# Patient Record
Sex: Female | Born: 1970 | Race: White | Hispanic: No | Marital: Single | Smoking: Former smoker
Health system: Southern US, Community
[De-identification: ages and names within clinical notes are randomized; demographics above are authoritative.]

## PROBLEM LIST (undated history)

## (undated) DIAGNOSIS — F32A Depression, unspecified: Secondary | ICD-10-CM

## (undated) DIAGNOSIS — F419 Anxiety disorder, unspecified: Secondary | ICD-10-CM

## (undated) DIAGNOSIS — R011 Cardiac murmur, unspecified: Secondary | ICD-10-CM

## (undated) DIAGNOSIS — E0590 Thyrotoxicosis, unspecified without thyrotoxic crisis or storm: Secondary | ICD-10-CM

## (undated) DIAGNOSIS — E559 Vitamin D deficiency, unspecified: Secondary | ICD-10-CM

## (undated) DIAGNOSIS — E079 Disorder of thyroid, unspecified: Secondary | ICD-10-CM

## (undated) DIAGNOSIS — G43909 Migraine, unspecified, not intractable, without status migrainosus: Secondary | ICD-10-CM

## (undated) DIAGNOSIS — F329 Major depressive disorder, single episode, unspecified: Secondary | ICD-10-CM

## (undated) DIAGNOSIS — D649 Anemia, unspecified: Secondary | ICD-10-CM

## (undated) DIAGNOSIS — B019 Varicella without complication: Secondary | ICD-10-CM

## (undated) DIAGNOSIS — F988 Other specified behavioral and emotional disorders with onset usually occurring in childhood and adolescence: Secondary | ICD-10-CM

## (undated) DIAGNOSIS — R55 Syncope and collapse: Secondary | ICD-10-CM

## (undated) DIAGNOSIS — Z9989 Dependence on other enabling machines and devices: Secondary | ICD-10-CM

## (undated) DIAGNOSIS — N926 Irregular menstruation, unspecified: Secondary | ICD-10-CM

## (undated) DIAGNOSIS — R1310 Dysphagia, unspecified: Secondary | ICD-10-CM

## (undated) DIAGNOSIS — R7303 Prediabetes: Secondary | ICD-10-CM

## (undated) DIAGNOSIS — G4733 Obstructive sleep apnea (adult) (pediatric): Secondary | ICD-10-CM

## (undated) DIAGNOSIS — K219 Gastro-esophageal reflux disease without esophagitis: Secondary | ICD-10-CM

## (undated) DIAGNOSIS — M1990 Unspecified osteoarthritis, unspecified site: Secondary | ICD-10-CM

## (undated) HISTORY — DX: Morbid (severe) obesity due to excess calories: E66.01

## (undated) HISTORY — DX: Thyrotoxicosis, unspecified without thyrotoxic crisis or storm: E05.90

## (undated) HISTORY — DX: Dependence on other enabling machines and devices: Z99.89

## (undated) HISTORY — DX: Other specified behavioral and emotional disorders with onset usually occurring in childhood and adolescence: F98.8

## (undated) HISTORY — DX: Syncope and collapse: R55

## (undated) HISTORY — DX: Varicella without complication: B01.9

## (undated) HISTORY — DX: Obstructive sleep apnea (adult) (pediatric): G47.33

## (undated) HISTORY — DX: Major depressive disorder, single episode, unspecified: F32.9

## (undated) HISTORY — DX: Dysphagia, unspecified: R13.10

## (undated) HISTORY — DX: Migraine, unspecified, not intractable, without status migrainosus: G43.909

## (undated) HISTORY — DX: Vitamin D deficiency, unspecified: E55.9

## (undated) HISTORY — PX: TILT TABLE STUDY: SHX6124

## (undated) HISTORY — DX: Disorder of thyroid, unspecified: E07.9

## (undated) HISTORY — DX: Depression, unspecified: F32.A

## (undated) HISTORY — PX: WISDOM TOOTH EXTRACTION: SHX21

## (undated) HISTORY — DX: Unspecified osteoarthritis, unspecified site: M19.90

## (undated) HISTORY — DX: Gastro-esophageal reflux disease without esophagitis: K21.9

## (undated) HISTORY — DX: Anxiety disorder, unspecified: F41.9

## (undated) HISTORY — PX: BIOPSY EYE MUSCLE: PRO14

## (undated) HISTORY — DX: Irregular menstruation, unspecified: N92.6

---
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
## 2003-07-30 ENCOUNTER — Ambulatory Visit (HOSPITAL_COMMUNITY): Admission: RE | Admit: 2003-07-30 | Discharge: 2003-07-30 | Payer: Self-pay | Admitting: Cardiology

## 2004-09-26 ENCOUNTER — Other Ambulatory Visit: Admission: RE | Admit: 2004-09-26 | Discharge: 2004-09-26 | Payer: Self-pay | Admitting: Obstetrics and Gynecology

## 2005-03-07 ENCOUNTER — Encounter (HOSPITAL_COMMUNITY): Admission: RE | Admit: 2005-03-07 | Discharge: 2005-06-05 | Payer: Self-pay | Admitting: Internal Medicine

## 2007-03-09 ENCOUNTER — Emergency Department (HOSPITAL_COMMUNITY): Admission: EM | Admit: 2007-03-09 | Discharge: 2007-03-09 | Payer: Self-pay | Admitting: Emergency Medicine

## 2007-03-19 ENCOUNTER — Encounter: Admission: RE | Admit: 2007-03-19 | Discharge: 2007-03-19 | Payer: Self-pay | Admitting: Family Medicine

## 2007-05-20 ENCOUNTER — Ambulatory Visit (HOSPITAL_BASED_OUTPATIENT_CLINIC_OR_DEPARTMENT_OTHER): Admission: RE | Admit: 2007-05-20 | Discharge: 2007-05-20 | Payer: Self-pay | Admitting: Family Medicine

## 2007-05-26 ENCOUNTER — Ambulatory Visit: Payer: Self-pay | Admitting: Internal Medicine

## 2008-01-02 ENCOUNTER — Encounter: Admission: RE | Admit: 2008-01-02 | Discharge: 2008-01-02 | Payer: Self-pay | Admitting: Family Medicine

## 2008-01-14 ENCOUNTER — Encounter: Admission: RE | Admit: 2008-01-14 | Discharge: 2008-01-14 | Payer: Self-pay | Admitting: Family Medicine

## 2008-08-19 ENCOUNTER — Encounter: Admission: RE | Admit: 2008-08-19 | Discharge: 2008-08-19 | Payer: Self-pay | Admitting: Family Medicine

## 2009-05-09 ENCOUNTER — Emergency Department (HOSPITAL_COMMUNITY): Admission: EM | Admit: 2009-05-09 | Discharge: 2009-05-09 | Payer: Self-pay | Admitting: Emergency Medicine

## 2010-10-11 NOTE — Procedures (Signed)
NAME:  Yvette White, Yvette White NO.:  [id number]   MEDICAL RECORD NO.:  [id number]          PATIENT TYPE:  OUT   LOCATION:  SLEEP CENTER                 FACILITY:  [facility]   PHYSICIAN:  Clinton D. Maple Hudson, MD, FCCP, FACPDATE OF BIRTH:  Jan 07, 1973   DATE OF STUDY:  05/20/2007                            NOCTURNAL POLYSOMNOGRAM   REFERRING PHYSICIAN:  Talmadge Coventry, M.D.   INDICATION FOR STUDY:  Hypersomnia with sleep apnea.  BMI 45, weight 280  pounds, height 66 inches, neck 14 inches.   EPWORTH SLEEPINESS SCORE:  3/24   MEDICATIONS:  Home medications charted and reviewed.   SLEEP ARCHITECTURE:  Total sleep time 369 minutes with sleep efficiency  85%.  Stage I was 8%, stage II 57%, stage III 6%, REM 28% of total sleep  time.  Sleep latency 31 minutes, REM latency 103 minutes, awake after  sleep onset 21 minutes, arousal index 9.4.  Zolpidem was taken at 2230  p.m.   RESPIRATORY DATA:  Apnea/hypopnea index (AHI) 0.5 per hour, respiratory  disturbance index (RDI) 2.1 per hour (normal range 0-5 per hour).  There  were a total of three obstructive events, all hypopneas recorded while  sleeping supine.   OXYGEN DATA:  No snoring noted by technician.  Oxygen saturation nadir  91% with mean oxygen saturation through the study 95.6% on room air.   CARDIAC DATA:  Normal sinus rhythm.   MOVEMENT-PARASOMNIA:  No movement disturbance.  No bathroom trips.   IMPRESSIONS-RECOMMENDATIONS:  1. Unremarkable sleep architecture for sleep center environment.      Percentage of time spent in REM is a little higher than usually      seen for adults, sometimes reflecting rebound from recent previous      REM sleep deprivation or suppression by an antidepressant.  2. Insignificant respiratory sleep disturbance, apnea/hypopnea index      0.5 per hour with no snoring noted by the technician.      Clinton D. Maple Hudson, MD, [facility], FACP  Diplomate, Biomedical engineer of Sleep Medicine  Electronically Signed     CDY/MEDQ  D:  05/26/2007 12:04:29  T:  05/26/2007 23:47:05  Job:  [id number]

## 2010-10-14 NOTE — Cardiovascular Report (Signed)
Yvette White, Yvette White NO.:  [id number]   MEDICAL RECORD NO.:  [id number]                   PATIENT TYPE:  OIB   LOCATION:  2861                                 FACILITY:  [facility]   PHYSICIAN:  Armanda Magic, M.D.                  DATE OF BIRTH:  12-30-1970   DATE OF PROCEDURE:  07/30/2003  DATE OF DISCHARGE:  07/30/2003                              CARDIAC CATHETERIZATION   REFERRING PHYSICIAN:  Lyn Records, M.D.   PROCEDURE PERFORMED:  Tilt table test.   CARDIOLOGIST:  Armanda Magic, M.D.   INDICATIONS:  Syncope.   COMPLICATIONS:  None.   HISTORY:  This is a 40 year old white female who has had a syncopal episodes  and symptoms consistent with vasovagal syncope who now presents for a tilt  table test.   DESCRIPTION OF PROCEDURE:  The patient was bought to the cardiac  catheterization laboratory in the fasting nonsedated state.  Informed  consent was obtained. The patient was connected to continuous heart rate and  pulse oximetry monitoring, and intermittent blood pressure monitoring.  The  patient's blood pressure was measured for five minutes in the supine  position.  Baseline blood pressure measured 114/60 to 123/56 with heart  rates of 43-50.  The patient was then tilted upright to 70 degrees for a  total of 26 minutes.  At about 19 minutes into the tilt the patient started  feeling unsteady.  Blood pressure was 99/64 with a heart rate of 66.  She  then started to feel very nauseated and dizzy.  Blood pressure was around  101/68 with a heart rate of 68.  Suddenly she felt very syncopal and  actually passed out.  Heart rate went to 38 beats/minute with a 5.2 second  pause.  Blood pressure was 110/64.   Atropine 0.5 mg IV was given and the patient's blood pressure and heart rate  improved significantly after being placed in the supine position.   At the end of the procedure the patient was transferred back to her room in  stable  condition.   ASSESSMENT:  1. Syncope.  2. Positive tilt table test for vasovagal syncope, primarily bradycardia-     induced.   PLAN:  1. We will start Zoloft 25 mg a day.  2. The patient will follow up with me in two weeks.   Yvette White does get a definite prodrome prior to having her syncope and she has  really only passed out one time.  She states she has plenty of time if she  is driving to get off the road if she would feel it coming on.  She has  never passed out without a prodrome; status post, I told her it is okay for  her to drive, but mainly in town, would not recommend any significant  highway driving for now.  She is going to see me back  in two weeks.                                               Armanda Magic, M.D.    TT/MEDQ  D:  07/31/2003  T:  08/01/2003  Job:  [id number]   cc:   Lyn Records III, M.D.  [address]  [address]  [place]  [place] [address]  Fax: [phone]

## 2011-03-09 LAB — COMPREHENSIVE METABOLIC PANEL
ALT: 30
AST: 25
Albumin: 3.7
Alkaline Phosphatase: 100
BUN: 9
CO2: 26
Calcium: 8.5
Chloride: 102
Creatinine, Ser: 0.61
GFR calc Af Amer: >60
GFR calc non Af Amer: >60
Glucose, Bld: 90
Potassium: 3.5
Sodium: 136
Total Bilirubin: 0.5
Total Protein: 6.8

## 2011-03-09 LAB — CBC
HCT: 39
Hemoglobin: 13.4
MCHC: 34.2
MCV: 90.9
Platelets: 297
RBC: 4.3
RDW: 12.4
WBC: 9.8

## 2011-03-09 LAB — DIFFERENTIAL
Basophils Absolute: 0
Basophils Relative: 0
Eosinophils Absolute: 0.2
Eosinophils Relative: 2
Lymphocytes Relative: 17
Lymphs Abs: 1.7
Monocytes Absolute: 0.6
Monocytes Relative: 6
Neutro Abs: 7.2
Neutrophils Relative %: 74

## 2011-03-09 LAB — TSH: TSH: 0.244 — ABNORMAL LOW

## 2011-04-12 ENCOUNTER — Other Ambulatory Visit: Payer: Self-pay | Admitting: Family Medicine

## 2011-04-12 DIAGNOSIS — R58 Hemorrhage, not elsewhere classified: Secondary | ICD-10-CM

## 2011-04-14 ENCOUNTER — Ambulatory Visit
Admission: RE | Admit: 2011-04-14 | Discharge: 2011-04-14 | Disposition: A | Discharge status: Home / Self Care | Payer: BC Managed Care – PPO | Source: Ambulatory Visit | Attending: Family Medicine | Admitting: Family Medicine

## 2011-04-14 DIAGNOSIS — R58 Hemorrhage, not elsewhere classified: Secondary | ICD-10-CM

## 2012-08-21 ENCOUNTER — Encounter: Payer: Self-pay | Admitting: Diagnostic Neuroimaging

## 2012-08-22 ENCOUNTER — Institutional Professional Consult (permissible substitution): Payer: Self-pay | Admitting: Diagnostic Neuroimaging

## 2012-08-23 ENCOUNTER — Ambulatory Visit: Payer: Self-pay | Admitting: Diagnostic Neuroimaging

## 2012-09-16 NOTE — Telephone Encounter (Signed)
This encounter was created in error - please disregard.

## 2013-01-17 ENCOUNTER — Other Ambulatory Visit: Payer: Self-pay

## 2013-01-17 DIAGNOSIS — Z1231 Encounter for screening mammogram for malignant neoplasm of breast: Secondary | ICD-10-CM

## 2013-02-12 ENCOUNTER — Ambulatory Visit
Admission: RE | Admit: 2013-02-12 | Discharge: 2013-02-12 | Disposition: A | Discharge status: Home / Self Care | Payer: BC Managed Care – PPO | Source: Ambulatory Visit

## 2013-02-12 DIAGNOSIS — Z1231 Encounter for screening mammogram for malignant neoplasm of breast: Secondary | ICD-10-CM

## 2013-02-13 ENCOUNTER — Other Ambulatory Visit: Payer: Self-pay | Admitting: Family Medicine

## 2013-02-13 DIAGNOSIS — R928 Other abnormal and inconclusive findings on diagnostic imaging of breast: Secondary | ICD-10-CM

## 2013-02-28 ENCOUNTER — Other Ambulatory Visit: Payer: BC Managed Care – PPO

## 2013-03-03 ENCOUNTER — Ambulatory Visit
Admission: RE | Admit: 2013-03-03 | Discharge: 2013-03-03 | Disposition: A | Discharge status: Home / Self Care | Payer: BC Managed Care – PPO | Source: Ambulatory Visit | Attending: Family Medicine | Admitting: Family Medicine

## 2013-03-03 ENCOUNTER — Other Ambulatory Visit: Payer: Self-pay | Admitting: Family Medicine

## 2013-03-03 DIAGNOSIS — R928 Other abnormal and inconclusive findings on diagnostic imaging of breast: Secondary | ICD-10-CM

## 2013-03-05 ENCOUNTER — Other Ambulatory Visit: Payer: BC Managed Care – PPO

## 2013-08-25 DIAGNOSIS — J302 Other seasonal allergic rhinitis: Secondary | ICD-10-CM | POA: Insufficient documentation

## 2014-12-28 ENCOUNTER — Other Ambulatory Visit: Payer: Self-pay | Admitting: Family Medicine

## 2014-12-28 ENCOUNTER — Other Ambulatory Visit (HOSPITAL_COMMUNITY)
Admission: RE | Admit: 2014-12-28 | Discharge: 2014-12-28 | Disposition: A | Discharge status: Home / Self Care | Payer: 59 | Source: Ambulatory Visit | Attending: Family Medicine | Admitting: Family Medicine

## 2014-12-28 DIAGNOSIS — Z124 Encounter for screening for malignant neoplasm of cervix: Secondary | ICD-10-CM | POA: Diagnosis not present

## 2014-12-31 LAB — CYTOLOGY - PAP

## 2015-09-06 DIAGNOSIS — K219 Gastro-esophageal reflux disease without esophagitis: Secondary | ICD-10-CM | POA: Diagnosis not present

## 2015-10-05 DIAGNOSIS — H04123 Dry eye syndrome of bilateral lacrimal glands: Secondary | ICD-10-CM | POA: Diagnosis not present

## 2015-10-05 DIAGNOSIS — H10413 Chronic giant papillary conjunctivitis, bilateral: Secondary | ICD-10-CM | POA: Diagnosis not present

## 2015-12-27 DIAGNOSIS — F900 Attention-deficit hyperactivity disorder, predominantly inattentive type: Secondary | ICD-10-CM | POA: Diagnosis not present

## 2015-12-27 DIAGNOSIS — F340 Cyclothymic disorder: Secondary | ICD-10-CM | POA: Diagnosis not present

## 2016-03-06 DIAGNOSIS — Z6841 Body Mass Index (BMI) 40.0 and over, adult: Secondary | ICD-10-CM | POA: Insufficient documentation

## 2016-03-06 DIAGNOSIS — K219 Gastro-esophageal reflux disease without esophagitis: Secondary | ICD-10-CM | POA: Insufficient documentation

## 2016-03-06 DIAGNOSIS — N926 Irregular menstruation, unspecified: Secondary | ICD-10-CM | POA: Insufficient documentation

## 2016-03-06 DIAGNOSIS — R1314 Dysphagia, pharyngoesophageal phase: Secondary | ICD-10-CM | POA: Diagnosis not present

## 2016-03-06 DIAGNOSIS — R55 Syncope and collapse: Secondary | ICD-10-CM | POA: Insufficient documentation

## 2016-03-08 ENCOUNTER — Encounter: Payer: Self-pay | Admitting: Internal Medicine

## 2016-03-09 DIAGNOSIS — N926 Irregular menstruation, unspecified: Secondary | ICD-10-CM | POA: Diagnosis not present

## 2016-03-09 DIAGNOSIS — R1314 Dysphagia, pharyngoesophageal phase: Secondary | ICD-10-CM | POA: Diagnosis not present

## 2016-03-09 DIAGNOSIS — Z13220 Encounter for screening for lipoid disorders: Secondary | ICD-10-CM | POA: Diagnosis not present

## 2016-03-09 DIAGNOSIS — Z6841 Body Mass Index (BMI) 40.0 and over, adult: Secondary | ICD-10-CM | POA: Diagnosis not present

## 2016-03-09 DIAGNOSIS — E559 Vitamin D deficiency, unspecified: Secondary | ICD-10-CM | POA: Diagnosis not present

## 2016-03-09 DIAGNOSIS — K219 Gastro-esophageal reflux disease without esophagitis: Secondary | ICD-10-CM | POA: Diagnosis not present

## 2016-03-21 ENCOUNTER — Ambulatory Visit: Payer: Self-pay | Admitting: Internal Medicine

## 2016-03-22 DIAGNOSIS — N939 Abnormal uterine and vaginal bleeding, unspecified: Secondary | ICD-10-CM | POA: Diagnosis not present

## 2016-03-22 DIAGNOSIS — R102 Pelvic and perineal pain: Secondary | ICD-10-CM | POA: Diagnosis not present

## 2016-03-30 DIAGNOSIS — N925 Other specified irregular menstruation: Secondary | ICD-10-CM | POA: Diagnosis not present

## 2016-03-30 DIAGNOSIS — N939 Abnormal uterine and vaginal bleeding, unspecified: Secondary | ICD-10-CM | POA: Diagnosis not present

## 2016-05-23 ENCOUNTER — Ambulatory Visit: Payer: Self-pay | Admitting: Internal Medicine

## 2016-05-29 NOTE — H&P (Addendum)
  S:  Yvette White presents today for evaluation of abnormal bleeding.   O:    Physical exam:  General:  Alert and oriented.  Normocephalic and atraumatic.  No thyromegaly or masses palpated.  Heart is regular rate and rhythm without murmur.  Lungs are clear to auscultation bilaterally.  Breasts without masses, lymphadenopathy or discharge.  Abdomen is soft, nontender, nondistended.  No rebound or guarding.  No flank pain is noted.  Extremities without clubbing, cyanosis or edema.  Normal external genitalia, Bartholin, Skene's and urethra.  Vagina without lesions.  Cervix within normal limits.  Uterus is anteverted, mobile and nontender.  No adnexal masses are palpable.  No inguinal    Saline infusion ultrasound was carried out without difficulty and shows several intramural fibroids measuring 2.7 and 2.4 cm in size.  She does have a simple left ovarian cyst 3.5 cm in size.  However, the endometrial lining is slightly thickened.  There is a posterior wall area that is suspicious for a polyp that is 10 mm in size and one on the anterior surface as well.    A/P:  Abnormal uterine bleeding.  Endometrial masses consistent with polyps.  Recommend hysteroscopy D&C for evaluation and removal of these.  Discussed the risks and benefits and pros and cons at length.  She does give her informed consent and wishes to proceed.   06/08/16 This patient has been seen and examined.   All of her questions were answered.  Labs and vital signs reviewed.  Informed consent has been obtained.  The History and Physical is current. DL

## 2016-05-31 NOTE — Patient Instructions (Signed)
Your procedure is scheduled on:  Thursday, Jan. 11, 2018  Enter through the [employer] [facility] at:  6:00 AM  Pick up the phone at the desk and dial [phone].  Call this number if you have problems the morning of surgery: [phone].  Remember: Do NOT eat food or drink after:  Midnight Wednesday  Take these medicines the morning of surgery with a SIP OF WATER:  Omeprazole, Clonazepam if needed  Stop ALL herbal medications at this time   Do NOT wear jewelry (body piercing), metal hair clips/bobby pins, make-up, or nail polish. Do NOT wear lotions, powders, or perfumes.  You may wear deodorant. Do NOT shave for 48 hours prior to surgery. Do NOT bring valuables to the hospital. Contacts, dentures, or bridgework may not be worn into surgery.  Have a responsible adult drive you home and stay with you for 24 hours after your procedure

## 2016-06-01 ENCOUNTER — Encounter (HOSPITAL_COMMUNITY): Payer: Self-pay

## 2016-06-01 ENCOUNTER — Encounter (HOSPITAL_COMMUNITY)
Admission: RE | Admit: 2016-06-01 | Discharge: 2016-06-01 | Disposition: A | Discharge status: Home / Self Care | Payer: BLUE CROSS/BLUE SHIELD | Source: Ambulatory Visit | Attending: Obstetrics and Gynecology | Admitting: Obstetrics and Gynecology

## 2016-06-01 DIAGNOSIS — N859 Noninflammatory disorder of uterus, unspecified: Secondary | ICD-10-CM | POA: Diagnosis not present

## 2016-06-01 DIAGNOSIS — Z01812 Encounter for preprocedural laboratory examination: Secondary | ICD-10-CM | POA: Diagnosis not present

## 2016-06-01 DIAGNOSIS — N939 Abnormal uterine and vaginal bleeding, unspecified: Secondary | ICD-10-CM | POA: Insufficient documentation

## 2016-06-01 HISTORY — DX: Anemia, unspecified: D64.9

## 2016-06-01 HISTORY — DX: Prediabetes: R73.03

## 2016-06-01 HISTORY — DX: Cardiac murmur, unspecified: R01.1

## 2016-06-01 LAB — CBC
HCT: 35.3 % — ABNORMAL LOW (ref 36.0–46.0)
Hemoglobin: 11.7 g/dL — ABNORMAL LOW (ref 12.0–15.0)
MCH: 26.5 pg (ref 26.0–34.0)
MCHC: 33.1 g/dL (ref 30.0–36.0)
MCV: 80 fL (ref 78.0–100.0)
PLATELETS: 271 10*3/uL (ref 150–400)
RBC: 4.41 MIL/uL (ref 3.87–5.11)
RDW: 13.1 % (ref 11.5–15.5)
WBC: 9.6 10*3/uL (ref 4.0–10.5)

## 2016-06-07 MED ORDER — GENTAMICIN SULFATE 40 MG/ML IJ SOLN
INTRAVENOUS | Status: AC
  Administered 2016-06-08: 100 mL via INTRAVENOUS
  Filled 2016-06-07: qty 12.75

## 2016-06-07 NOTE — Anesthesia Preprocedure Evaluation (Addendum)
Anesthesia Evaluation  Patient identified by MRN, date of birth, ID band Patient awake    Reviewed: Allergy & Precautions, NPO status , Patient's Chart, lab work & pertinent test results  Airway Mallampati: I  TM Distance: >3 FB Neck ROM: Full    Dental  (+) Teeth Intact, Dental Advisory Given   Pulmonary neg pulmonary ROS, former smoker,    breath sounds clear to auscultation       Cardiovascular negative cardio ROS   Rhythm:Regular Rate:Normal     Neuro/Psych  Headaches, PSYCHIATRIC DISORDERS Anxiety Depression    GI/Hepatic Neg liver ROS, GERD  Medicated and Controlled,  Endo/Other  Hyperthyroidism Morbid obesity  Renal/GU negative Renal ROS  negative genitourinary   Musculoskeletal  (+) Arthritis , Osteoarthritis,    Abdominal   Peds negative pediatric ROS (+)  Hematology  (+) anemia ,   Anesthesia Other Findings Neurocardiogenic Syncope  Reproductive/Obstetrics negative OB ROS                            Lab Results  Component Value Date   WBC 9.6 06/01/2016   HGB 11.7 (L) 06/01/2016   HCT 35.3 (L) 06/01/2016   MCV 80.0 06/01/2016   PLT 271 06/01/2016   No results found for: INR, PROTIME   Anesthesia Physical Anesthesia Plan  ASA: III  Anesthesia Plan: General   Post-op Pain Management:    Induction: Intravenous  Airway Management Planned: LMA  Additional Equipment:   Intra-op Plan:   Post-operative Plan: Extubation in OR  Informed Consent: I have reviewed the patients History and Physical, chart, labs and discussed the procedure including the risks, benefits and alternatives for the proposed anesthesia with the patient or authorized representative who has indicated his/her understanding and acceptance.   Dental advisory given  Plan Discussed with: CRNA  Anesthesia Plan Comments:        Anesthesia Quick Evaluation

## 2016-06-08 ENCOUNTER — Encounter (HOSPITAL_COMMUNITY): Payer: Self-pay | Admitting: Emergency Medicine

## 2016-06-08 ENCOUNTER — Ambulatory Visit (HOSPITAL_COMMUNITY): Payer: BLUE CROSS/BLUE SHIELD | Admitting: Anesthesiology

## 2016-06-08 ENCOUNTER — Encounter (HOSPITAL_COMMUNITY)
Admission: RE | Disposition: A | Discharge status: Home / Self Care | Payer: Self-pay | Source: Ambulatory Visit | Attending: Obstetrics and Gynecology

## 2016-06-08 ENCOUNTER — Ambulatory Visit (HOSPITAL_COMMUNITY)
Admission: RE | Admit: 2016-06-08 | Discharge: 2016-06-08 | Disposition: A | Discharge status: Home / Self Care | Payer: BLUE CROSS/BLUE SHIELD | Source: Ambulatory Visit | Attending: Obstetrics and Gynecology | Admitting: Obstetrics and Gynecology

## 2016-06-08 DIAGNOSIS — K219 Gastro-esophageal reflux disease without esophagitis: Secondary | ICD-10-CM | POA: Insufficient documentation

## 2016-06-08 DIAGNOSIS — F329 Major depressive disorder, single episode, unspecified: Secondary | ICD-10-CM | POA: Insufficient documentation

## 2016-06-08 DIAGNOSIS — N939 Abnormal uterine and vaginal bleeding, unspecified: Secondary | ICD-10-CM | POA: Diagnosis not present

## 2016-06-08 DIAGNOSIS — F419 Anxiety disorder, unspecified: Secondary | ICD-10-CM | POA: Diagnosis not present

## 2016-06-08 DIAGNOSIS — E039 Hypothyroidism, unspecified: Secondary | ICD-10-CM | POA: Diagnosis not present

## 2016-06-08 DIAGNOSIS — R51 Headache: Secondary | ICD-10-CM | POA: Diagnosis not present

## 2016-06-08 DIAGNOSIS — Z87891 Personal history of nicotine dependence: Secondary | ICD-10-CM | POA: Diagnosis not present

## 2016-06-08 DIAGNOSIS — D649 Anemia, unspecified: Secondary | ICD-10-CM | POA: Insufficient documentation

## 2016-06-08 DIAGNOSIS — D251 Intramural leiomyoma of uterus: Secondary | ICD-10-CM | POA: Insufficient documentation

## 2016-06-08 DIAGNOSIS — N840 Polyp of corpus uteri: Secondary | ICD-10-CM | POA: Diagnosis not present

## 2016-06-08 DIAGNOSIS — O021 Missed abortion: Secondary | ICD-10-CM | POA: Diagnosis not present

## 2016-06-08 DIAGNOSIS — Z79899 Other long term (current) drug therapy: Secondary | ICD-10-CM | POA: Diagnosis not present

## 2016-06-08 DIAGNOSIS — M1990 Unspecified osteoarthritis, unspecified site: Secondary | ICD-10-CM | POA: Insufficient documentation

## 2016-06-08 DIAGNOSIS — N858 Other specified noninflammatory disorders of uterus: Secondary | ICD-10-CM | POA: Diagnosis not present

## 2016-06-08 HISTORY — PX: DILATATION & CURETTAGE/HYSTEROSCOPY WITH MYOSURE: SHX6511

## 2016-06-08 LAB — PREGNANCY, URINE: PREG TEST UR: NEGATIVE

## 2016-06-08 SURGERY — DILATATION & CURETTAGE/HYSTEROSCOPY WITH MYOSURE
Anesthesia: General | Site: Vagina

## 2016-06-08 MED ORDER — GLYCOPYRROLATE 0.2 MG/ML IJ SOLN
INTRAMUSCULAR | Status: DC | PRN
  Administered 2016-06-08: 0.1 mg via INTRAVENOUS

## 2016-06-08 MED ORDER — MIDAZOLAM HCL 2 MG/2ML IJ SOLN
INTRAMUSCULAR | Status: DC | PRN
  Administered 2016-06-08: 1 mg via INTRAVENOUS

## 2016-06-08 MED ORDER — KETOROLAC TROMETHAMINE 30 MG/ML IJ SOLN
INTRAMUSCULAR | Status: DC | PRN
  Administered 2016-06-08: 30 mg via INTRAVENOUS

## 2016-06-08 MED ORDER — OXYCODONE HCL 5 MG/5ML PO SOLN: 5.0000 mg | Freq: Once | ORAL | Status: DC | PRN

## 2016-06-08 MED ORDER — DEXAMETHASONE SODIUM PHOSPHATE 4 MG/ML IJ SOLN
INTRAMUSCULAR | Status: DC | PRN
  Administered 2016-06-08: 4 mg via INTRAVENOUS

## 2016-06-08 MED ORDER — SCOPOLAMINE 1 MG/3DAYS TD PT72
1.0000 | MEDICATED_PATCH | Freq: Once | TRANSDERMAL | Status: DC
  Administered 2016-06-08: 1.5 mg via TRANSDERMAL

## 2016-06-08 MED ORDER — OXYCODONE HCL 5 MG PO TABS
5.0000 mg | ORAL_TABLET | Freq: Once | ORAL | Status: DC | PRN
Start: 2016-06-08 — End: 2016-06-08

## 2016-06-08 MED ORDER — FENTANYL CITRATE (PF) 250 MCG/5ML IJ SOLN
INTRAMUSCULAR | Status: DC | PRN
  Administered 2016-06-08: 25 ug via INTRAVENOUS
  Administered 2016-06-08: 50 ug via INTRAVENOUS

## 2016-06-08 MED ORDER — MEPERIDINE HCL 25 MG/ML IJ SOLN: 6.2500 mg | INTRAMUSCULAR | Status: DC | PRN

## 2016-06-08 MED ORDER — MIDAZOLAM HCL 2 MG/2ML IJ SOLN
INTRAMUSCULAR | Status: AC
  Filled 2016-06-08: qty 2

## 2016-06-08 MED ORDER — LIDOCAINE-EPINEPHRINE 1 %-1:100000 IJ SOLN
INTRAMUSCULAR | Status: AC
  Filled 2016-06-08: qty 1

## 2016-06-08 MED ORDER — LACTATED RINGERS IV SOLN
INTRAVENOUS | Status: DC
  Administered 2016-06-08: 07:00:00 via INTRAVENOUS

## 2016-06-08 MED ORDER — LIDOCAINE HCL (CARDIAC) 20 MG/ML IV SOLN
INTRAVENOUS | Status: AC
  Filled 2016-06-08: qty 5

## 2016-06-08 MED ORDER — LIDOCAINE-EPINEPHRINE 1 %-1:100000 IJ SOLN
INTRAMUSCULAR | Status: DC | PRN
  Administered 2016-06-08: 20 mL

## 2016-06-08 MED ORDER — PROMETHAZINE HCL 25 MG/ML IJ SOLN: 6.2500 mg | INTRAMUSCULAR | Status: DC | PRN

## 2016-06-08 MED ORDER — LACTATED RINGERS IV SOLN: INTRAVENOUS | Status: DC

## 2016-06-08 MED ORDER — LIDOCAINE HCL (CARDIAC) 20 MG/ML IV SOLN
INTRAVENOUS | Status: DC | PRN
Start: 2016-06-08 — End: 2016-06-08
  Administered 2016-06-08: 100 mg via INTRAVENOUS

## 2016-06-08 MED ORDER — PROPOFOL 10 MG/ML IV BOLUS
INTRAVENOUS | Status: AC
  Filled 2016-06-08: qty 20

## 2016-06-08 MED ORDER — ONDANSETRON HCL 4 MG/2ML IJ SOLN
INTRAMUSCULAR | Status: AC
  Filled 2016-06-08: qty 2

## 2016-06-08 MED ORDER — SCOPOLAMINE 1 MG/3DAYS TD PT72
MEDICATED_PATCH | TRANSDERMAL | Status: AC
  Administered 2016-06-08: 1.5 mg via TRANSDERMAL
  Filled 2016-06-08: qty 1

## 2016-06-08 MED ORDER — FENTANYL CITRATE (PF) 100 MCG/2ML IJ SOLN
INTRAMUSCULAR | Status: AC
  Filled 2016-06-08: qty 4

## 2016-06-08 MED ORDER — FLUMAZENIL 0.5 MG/5ML IV SOLN
INTRAVENOUS | Status: AC
  Filled 2016-06-08: qty 5

## 2016-06-08 MED ORDER — FENTANYL CITRATE (PF) 100 MCG/2ML IJ SOLN: 25.0000 ug | INTRAMUSCULAR | Status: DC | PRN

## 2016-06-08 MED ORDER — SODIUM CHLORIDE 0.9 % IR SOLN
Status: DC | PRN
  Administered 2016-06-08: 3000 mL

## 2016-06-08 MED ORDER — ONDANSETRON HCL 4 MG/2ML IJ SOLN
INTRAMUSCULAR | Status: DC | PRN
  Administered 2016-06-08: 4 mg via INTRAVENOUS

## 2016-06-08 MED ORDER — DEXAMETHASONE SODIUM PHOSPHATE 10 MG/ML IJ SOLN
INTRAMUSCULAR | Status: AC
  Filled 2016-06-08: qty 1

## 2016-06-08 MED ORDER — PROPOFOL 10 MG/ML IV BOLUS
INTRAVENOUS | Status: DC | PRN
  Administered 2016-06-08: 50 mg via INTRAVENOUS
  Administered 2016-06-08: 300 mg via INTRAVENOUS

## 2016-06-08 SURGICAL SUPPLY — 19 items
CATH ROBINSON RED A/P 16FR (CATHETERS) ×2 IMPLANT
CLOTH BEACON ORANGE TIMEOUT ST (SAFETY) ×2 IMPLANT
CONTAINER PREFILL 10% NBF 60ML (FORM) ×4 IMPLANT
DEVICE MYOSURE LITE (MISCELLANEOUS) IMPLANT
DEVICE MYOSURE REACH (MISCELLANEOUS) IMPLANT
FILTER ARTHROSCOPY CONVERTOR (FILTER) ×2 IMPLANT
FLUID NSS /IRRIG 3000 ML XXX (IV SOLUTION) ×1 IMPLANT
GLOVE BIO SURGEON STRL SZ8 (GLOVE) ×2 IMPLANT
GLOVE BIOGEL PI IND STRL 7.0 (GLOVE) ×1 IMPLANT
GLOVE BIOGEL PI INDICATOR 7.0 (GLOVE) ×1
GLOVE SURG ORTHO 8.0 STRL STRW (GLOVE) ×2 IMPLANT
GOWN STRL REUS W/TWL LRG LVL3 (GOWN DISPOSABLE) ×4 IMPLANT
PACK VAGINAL MINOR WOMEN LF (CUSTOM PROCEDURE TRAY) ×2 IMPLANT
PAD OB MATERNITY 4.3X12.25 (PERSONAL CARE ITEMS) ×2 IMPLANT
SEAL ROD LENS SCOPE MYOSURE (ABLATOR) ×2 IMPLANT
TOWEL OR 17X24 6PK STRL BLUE (TOWEL DISPOSABLE) ×4 IMPLANT
TUBING AQUILEX INFLOW (TUBING) ×2 IMPLANT
TUBING AQUILEX OUTFLOW (TUBING) ×2 IMPLANT
WATER STERILE IRR 1000ML POUR (IV SOLUTION) ×1 IMPLANT

## 2016-06-08 NOTE — Discharge Instructions (Signed)
°  Post Anesthesia Home Care Instructions  Activity: Get plenty of rest for the remainder of the day. A responsible adult should stay with you for 24 hours following the procedure.  For the next 24 hours, DO NOT: -Drive a car -Paediatric nurse -Drink alcoholic beverages -Take any medication unless instructed by your physician -Make any legal decisions or sign important papers.  Meals: Start with liquid foods such as gelatin or soup. Progress to regular foods as tolerated. Avoid greasy, spicy, heavy foods. If nausea and/or vomiting occur, drink only clear liquids until the nausea and/or vomiting subsides. Call your physician if vomiting continues.  Special Instructions/Symptoms: Your throat may feel dry or sore from the anesthesia or the breathing tube placed in your throat during surgery. If this causes discomfort, gargle with warm salt water. The discomfort should disappear within 24 hours.  If you had a scopolamine patch placed behind your ear for the management of post- operative nausea and/or vomiting:  1. The medication in the patch is effective for 72 hours, after which it should be removed.  Wrap patch in a tissue and discard in the trash. Wash hands thoroughly with soap and water. 2. You may remove the patch earlier than 72 hours if you experience unpleasant side effects which may include dry mouth, dizziness or visual disturbances. 3. Avoid touching the patch. Wash your hands with soap and water after contact with the patch.     NO IBUPROFEN PRODUCTS (MOTRIN, ADVIL) OR ALEVE UNTIL ____________________.

## 2016-06-08 NOTE — Anesthesia Procedure Notes (Signed)
Procedure Name: LMA Insertion Date/Time: 06/08/2016 7:53 AM Performed by: Flossie Dibble Pre-anesthesia Checklist: Emergency Drugs available, Patient being monitored, Timeout performed, Patient identified and Suction available Patient Re-evaluated:Patient Re-evaluated prior to inductionOxygen Delivery Method: Circle system utilized Preoxygenation: Pre-oxygenation with 100% oxygen Intubation Type: IV induction LMA: LMA inserted LMA Size: 4.0 Number of attempts: 1 Airway Equipment and Method: Patient positioned with wedge pillow Placement Confirmation: breath sounds checked- equal and bilateral and positive ETCO2 Tube secured with: Tape Dental Injury: Teeth and Oropharynx as per pre-operative assessment

## 2016-06-08 NOTE — Brief Op Note (Signed)
06/08/2016  8:17 AM  PATIENT:  Yvette White  46 y.o. female  PRE-OPERATIVE DIAGNOSIS:  AUB, EM masses  POST-OPERATIVE DIAGNOSIS:  AUB, EM masses  PROCEDURE:  Procedure(s): DILATATION & CURETTAGE/HYSTEROSCOPY (N/A)  SURGEON:  Surgeon(s) and Role:    * Louretta Shorten, MD - Primary  PHYSICIAN ASSISTANT:   ASSISTANTS: none   ANESTHESIA:   local and MAC  EBL:  Total I/O In: -  Out: 10 [Blood:10]  BLOOD ADMINISTERED:none  DRAINS: none   LOCAL MEDICATIONS USED:  MARCAINE     SPECIMEN:  Source of Specimen:  endometrial polyps and currettings and Mastectomy with SLN  DISPOSITION OF SPECIMEN:  PATHOLOGY  COUNTS:  YES  TOURNIQUET:  * No tourniquets in log *  DICTATION: .Other Dictation: Dictation Number 1  PLAN OF CARE: Discharge to home after PACU  PATIENT DISPOSITION:  PACU - hemodynamically stable.   Delay start of Pharmacological VTE agent (>24hrs) due to surgical blood loss or risk of bleeding: not applicable

## 2016-06-08 NOTE — OR Nursing (Signed)
OR set up and staff ready. MD phoned at [phone], left voicemail.

## 2016-06-08 NOTE — Anesthesia Postprocedure Evaluation (Addendum)
Anesthesia Post Note  Patient: Yvette White  Procedure(s) Performed: Procedure(s) (LRB): DILATATION & CURETTAGE/HYSTEROSCOPY (N/A)  Patient location during evaluation: PACU Anesthesia Type: General Level of consciousness: awake and alert Pain management: pain level controlled Vital Signs Assessment: post-procedure vital signs reviewed and stable Respiratory status: spontaneous breathing, nonlabored ventilation, respiratory function stable and patient connected to nasal cannula oxygen Cardiovascular status: blood pressure returned to baseline and stable Postop Assessment: no signs of nausea or vomiting Anesthetic complications: no        Last Vitals:  Vitals:   06/08/16 0926 06/08/16 1006  BP: (!) 113/59 (!) 109/49  Pulse: (!) 57 62  Resp: 18 18  Temp: 36.4 C     Last Pain:  Vitals:   06/08/16 0611  TempSrc: Oral   Pain Goal: Patients Stated Pain Goal: 3 (06/08/16 0611)               Effie Berkshire

## 2016-06-08 NOTE — Addendum Note (Signed)
Addendum  created 06/08/16 1427 by Asher Muir, CRNA   Charge Capture section accepted

## 2016-06-08 NOTE — Transfer of Care (Signed)
Immediate Anesthesia Transfer of Care Note  Patient: Yvette White  Procedure(s) Performed: Procedure(s): DILATATION & CURETTAGE/HYSTEROSCOPY (N/A)  Patient Location: PACU  Anesthesia Type:General  Level of Consciousness: awake, alert  and oriented  Airway & Oxygen Therapy: Patient Spontanous Breathing and Patient connected to nasal cannula oxygen  Post-op Assessment: Report given to RN and Post -op Vital signs reviewed and stable  Post vital signs: Reviewed and stable  Last Vitals:  Vitals:   06/08/16 0611  BP: (!) 151/76  Pulse: 62  Resp: 16  Temp: 36.5 C    Last Pain:  Vitals:   06/08/16 0611  TempSrc: Oral      Patients Stated Pain Goal: 3 ([phone])  Complications: No apparent anesthesia complications

## 2016-06-09 ENCOUNTER — Encounter (HOSPITAL_COMMUNITY): Payer: Self-pay | Admitting: Obstetrics and Gynecology

## 2016-06-09 NOTE — Op Note (Signed)
NAMEZONNIE, VANDENBOSSCHE NO.:  [id number]  MEDICAL RECORD NO.:  [id number]  LOCATION:                                 FACILITY:  PHYSICIAN:  Monia Sabal. Corinna Capra, M.D.         DATE OF BIRTH:  DATE OF PROCEDURE:  06/08/2016 DATE OF DISCHARGE:                              OPERATIVE REPORT   PREOPERATIVE DIAGNOSIS:  Abnormal uterine bleeding, endometrial masses.  POSTOP DIAGNOSIS:  Abnormal uterine bleeding, endometrial masses, and endometrial polyps.  PROCEDURE:  Hysteroscopy, D and C with polypectomy.  SURGEON:  Monia Sabal. Corinna Capra, M.D.  ANESTHESIA:  General via LMA and local.  INDICATIONS:  Ms. Langill is a 46 year old with abnormal bleeding, underwent a saline infusion, ultrasound showing endometrial masses consistent with polyps.  She presents for definitive surgical evaluation and treatment plan, hysteroscopy, D and C.  risks and benefits were discussed.  Informed consent was obtained.  FINDINGS:  At time of surgery:  Posterior and anterior wall endometrial polyps and slightly thickened endometrium, otherwise normal-appearing ostia and cervix.  DESCRIPTION OF PROCEDURE:  After adequate anesthesia, the patient was placed in the dorsal lithotomy position.  She was sterilely prepped and draped, bladder sterilely drained.  Graves speculum was placed. Tenaculum placed on anterior lip of cervix.  Paracervical block was placed with 1% Xylocaine and 1:100,000 epinephrine, total 20 mL used. Uterus sounded 7 cm, easily dilated to a #21 Pratt dilator. Hysteroscope was inserted.  The above findings were noted.  Polyp forceps were used to grasp and remove the endometrial polyp, followed by gentle sharp curettage, retrieving fragments of the base of the polyps and the endometrial curettings.  Re-examination with the hysteroscope revealed normal appearing cavity without any evidence of residual polyps and no obvious complications noted.  The hysteroscope was removed, tenaculum  removed from the cervix, noted to be hemostatic.  The patient was then transferred to recovery room in stable condition.  Sponge and instrument counts were normal x3.  Estimated blood loss was minimal. Saline deficit 50 mL.  DISPOSITION:  Patient will be discharged home with followup in the office in 2-3 weeks with a routine instruction sheet for D and C.  told to return for increased pain, fever, or bleeding.     Monia Sabal Corinna Capra, M.D.     DCL/MEDQ  D:  06/08/2016  T:  06/09/2016  Job:  [id number]

## 2016-06-15 ENCOUNTER — Ambulatory Visit: Payer: Self-pay | Admitting: Internal Medicine

## 2016-06-27 ENCOUNTER — Encounter: Payer: Self-pay | Admitting: Neurology

## 2016-06-27 ENCOUNTER — Ambulatory Visit (INDEPENDENT_AMBULATORY_CARE_PROVIDER_SITE_OTHER): Payer: BLUE CROSS/BLUE SHIELD | Admitting: Neurology

## 2016-06-27 VITALS — BP 124/80 | HR 70 | Resp 16 | Ht 65.0 in | Wt 375.0 lb

## 2016-06-27 DIAGNOSIS — R351 Nocturia: Secondary | ICD-10-CM

## 2016-06-27 DIAGNOSIS — K219 Gastro-esophageal reflux disease without esophagitis: Secondary | ICD-10-CM

## 2016-06-27 DIAGNOSIS — R400 Somnolence: Secondary | ICD-10-CM | POA: Diagnosis not present

## 2016-06-27 DIAGNOSIS — R51 Headache: Secondary | ICD-10-CM | POA: Diagnosis not present

## 2016-06-27 DIAGNOSIS — Z87898 Personal history of other specified conditions: Secondary | ICD-10-CM

## 2016-06-27 DIAGNOSIS — R0982 Postnasal drip: Secondary | ICD-10-CM

## 2016-06-27 DIAGNOSIS — R519 Headache, unspecified: Secondary | ICD-10-CM

## 2016-06-27 DIAGNOSIS — R0683 Snoring: Secondary | ICD-10-CM

## 2016-06-27 NOTE — Progress Notes (Signed)
Subjective:    Patient ID: Yvette White is a 46 y.o. female.  HPI     [name], MD, PhD [facility] [address] [place], [place] [address]  Dear Yvette White,   I saw your patient, Yvette White, upon your kind request in my neurologic clinic today for initial consultation of her sleep disorder, in particular, concern for underlying obstructive sleep apnea. The patient is unaccompanied today. As you know, Yvette White is a 46 year old right-handed woman with an underlying medical history of vitamin D deficiency, history of syncope, reflux disease, hypothyroidism, depression, ADHD and morbid obesity, who reports snoring and excessive daytime somnolence. I reviewed your office note from 03/06/2016, which you kindly included. She has a hx of syncope and has sleep studies in the remote past. Last sleep study was about 10 years ago, per patient neg. for OSA. She sees a psychiatrist for depression. She is hoping to wean off her medication. She has been able to come off some of her medications. She needs at least 8 hours of sleep to feel rested. 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 She is also a trained massage therapist. Her mother and her maternal aunt have obstructive sleep apnea and uses CPAP machine. Patient is single, has no children, lives with mom, sister and niece. She is known to snore and also endorses waking up with a sense of gasping for air. She also has reflux symptoms at night and is supposed to see a gastroenterologist. She is also considering weight loss surgery. She quit smoking in May 2016, takes alcohol about 5 glasses a month, caffeine 1-2 cups daily. She denies any frank restless leg symptoms or leg twitching at night. She tries to make sure she gets about 8 hours of sleep on any given night. She has a history of  syncope, thankfully none and about 9 or 10 years, will reestablish care with her cardiologist. She had an abnormal tilt table test in the past. She has nearly daily AM HAs, no meds for it, last about 3 hours, non migrainous, used to have migraines as a child.  She feels tired during the day, Epworth sleepiness score is 6 out of 24 today, her fatigue score is 39 out of 63.  Her Past Medical History Is Significant For: Past Medical History:  Diagnosis Date  . ADD (attention deficit disorder)   . Anemia   . Anxiety   . Arthritis   . Depression   . GERD (gastroesophageal reflux disease)   . Headache    Migraines  . Heart murmur   . Hyperthyroidism   . Morbid obesity ([place])   . Neurocardiogenic syncope   . Pre-diabetes   . Sleep disorder   . Vitamin D deficiency     Her Past Surgical History Is Significant For: Past Surgical History:  Procedure Laterality Date  . DILATATION & CURETTAGE/HYSTEROSCOPY WITH MYOSURE N/A 06/08/2016   Procedure: DILATATION & CURETTAGE/HYSTEROSCOPY;  Surgeon: Louretta Shorten, MD;  Location: [facility];  Service: Gynecology;  Laterality: N/A;  . TILT TABLE STUDY    . URETHRAL DILATION     age 53  . WISDOM TOOTH EXTRACTION      Her Family History Is Significant For: Family History  Problem Relation Age of Onset  . Diabetes Mother   . Heart disease Mother   . Uterine cancer  Mother   . Heart attack Father 31  . Diabetes Father   . Heart disease Father   . Atrial fibrillation Maternal Grandmother   . Parkinson's disease Maternal Grandmother   . Colon cancer Maternal Grandfather 70    Her Social History Is Significant For: Social History   Social History  . Marital status: Single    Spouse name: N/A  . Number of children: 0  . Years of education: BA   Social History Main Topics  . Smoking status: Former Smoker    Packs/day: 0.25    Years: 20.00    Types: Cigarettes    Quit date: 09/30/2014  . Smokeless tobacco: Never Used  . Alcohol use Yes      Comment: occ  . Drug use: No  . Sexual activity: Not Asked   Other Topics Concern  . None   Social History Narrative   Drinks 1-2 caffeine drinks a day     Her Allergies Are:  Allergies  Allergen Reactions  . Theophylline     Other reaction(s): Other (See Comments) Hyperactive  "Slo phylinne"  . Penicillins Rash    Has patient had a PCN reaction causing immediate rash, facial/tongue/throat swelling, SOB or lightheadedness with hypotension:unsure Has patient had a PCN reaction causing severe rash involving mucus membranes or skin necrosis:unsure Has patient had a PCN reaction that required hospitalization:No Has patient had a PCN reaction occurring within the last 10 years: No If all of the above answers are "NO", then may proceed with Cephalosporin use.   . Pseudoephedrine Rash  . Sulfamethoxazole-Trimethoprim Rash  :   Her Current Medications Are:  Outpatient Encounter Prescriptions as of 06/27/2016  Medication Sig  . acetaminophen (TYLENOL) 500 MG tablet Take 1,000 mg by mouth every 6 (six) hours as needed (for pain.).  Marland Kitchen Calcium Carbonate Antacid (ANTACID PO) Take 1 tablet by mouth at bedtime as needed (for breakthrough acid reflux.).  Marland Kitchen clonazePAM (KLONOPIN) 2 MG tablet Take 2 mg by mouth daily as needed (for severe anxiety).   Marland Kitchen lamoTRIgine (LAMICTAL) 200 MG tablet Take 200 mg by mouth every evening.   . loratadine (CLARITIN) 10 MG tablet Take 10 mg by mouth every evening.  . meloxicam (MOBIC) 15 MG tablet Take 15 mg by mouth daily.  Marland Kitchen omeprazole (PRILOSEC) 40 MG capsule Take 40 mg by mouth daily.  . Vitamin D, Ergocalciferol, (DRISDOL) 50000 units CAPS capsule Take 50,000 Units by mouth once a week.   No facility-administered encounter medications on file as of 06/27/2016.   :  Review of Systems:  Out of a complete 14 point review of systems, all are reviewed and negative with the exception of these symptoms as listed below: Review of Systems  Neurological:        Patient gets up a couple of times in the night, snores, has acid reflux at night and will wake up choking, wakes up feeling tired, morning headaches, daytime fatigue, denies taking naps.    Epworth Sleepiness Scale 0= would never doze 1= slight chance of dozing 2= moderate chance of dozing 3= high chance of dozing  Sitting and reading:1 Watching TV:2 Sitting inactive in a public place (ex. Theater or meeting):0 As a passenger in a car for an hour without a break:0 Lying down to rest in the afternoon:1 Sitting and talking to someone:1 Sitting quietly after lunch (no alcohol):0 In a car, while stopped in traffic:1 Total:6  Objective:  Neurologic Exam  Physical Exam Physical Examination:   Vitals:  06/27/16 0912  BP: 124/80  Pulse: 70  Resp: 16    General Examination: The patient is a very pleasant 46 y.o. female in no acute distress. She appears well-developed and well-nourished and well groomed.   HEENT: Normocephalic, atraumatic, pupils are equal, round and reactive to light and accommodation. She wears corrective eyeglasses. Funduscopic exam is normal with sharp disc margins noted. Extraocular tracking is good without limitation to gaze excursion or nystagmus noted. Normal smooth pursuit is noted. Hearing is grossly intact.  Face is symmetric with normal facial animation and normal facial sensation. Speech is clear with no dysarthria noted. There is no hypophonia. There is no lip, neck/head, jaw or voice tremor. Neck is supple with full range of passive and active motion. There are no carotid bruits on auscultation. Oropharynx exam reveals: mild mouth dryness, good dental hygiene and mild airway crowding, due to smaller airway entry, otherwise unremarkable exam, small uvula noted, normal to small size tongue, tonsils in place but 1+ bilaterally, not enlarged. Mallampati is class I. Tongue protrudes centrally and palate elevates symmetrically. Neck size is 17 inches. She has a Mild  overbite. Nasal inspection reveals no significant nasal mucosal bogginess or redness and no septal deviation.   Chest: Clear to auscultation without wheezing, rhonchi or crackles noted.  Heart: S1+S2+0, regular and normal without murmurs, rubs or gallops noted.   Abdomen: Soft, non-tender and non-distended with normal bowel sounds appreciated on auscultation.  Extremities: There is no pitting edema in the distal lower extremities bilaterally. Pedal pulses are intact.  Skin: Warm and dry without trophic changes noted. There are no varicose veins, but she has spider veins.  Musculoskeletal: exam reveals no obvious joint deformities, tenderness or joint swelling or erythema.   Neurologically:  Mental status: The patient is awake, alert and oriented in all 4 spheres. Her immediate and remote memory, attention, language skills and fund of knowledge are appropriate. There is no evidence of aphasia, agnosia, apraxia or anomia. Speech is clear with normal prosody and enunciation. Thought process is linear. Mood is normal and affect is normal.  Cranial nerves II - XII are as described above under HEENT exam. In addition: shoulder shrug is normal with equal shoulder height noted. Motor exam: Normal bulk, strength and tone is noted. There is no drift, tremor or rebound. Romberg is negative. Reflexes are 1+ in the upper extremities and trace in the lower extremities, fine motor skills and coordination: intact with normal finger taps, normal hand movements, normal rapid alternating patting, normal foot taps and normal foot agility.  Cerebellar testing: No dysmetria or intention tremor on finger to nose testing. Heel to shin is not tested today.   Sensory exam: intact to light touch, pinprick, vibration, temperature sense  proprioception in the upper and lower extremities.  Gait, station and balance: She stands easily. No veering to one side is noted. No leaning to one side is noted. Posture is age-appropriate  and stance is narrow based. Gait shows normal stride length and normal pace. No problems turning are noted. tandem walk is unremarkable.  Assessment and Plan:  In summary, Yvette White is a very pleasant 46 y.o.-year old female with an underlying medical history of vitamin D deficiency, history of syncope, reflux disease, hypothyroidism, depression, ADHD and morbid obesity, whose history, family history and physical exam concerning for obstructive sleep apnea (OSA). In addition, she reports nocturnal reflux symptoms, I agree with GI referral and evaluation. Furthermore, for her morbid obesity she is in the process  of being evaluated for weight loss surgery as she has tried to lose weight herself and has not had any consistent success. I had a long chat with the patient about my findings and the diagnosis of OSA, its prognosis and treatment options. We talked about medical treatments, surgical interventions and non-pharmacological approaches. I explained in particular the risks and ramifications of untreated moderate to severe OSA, especially with respect to developing cardiovascular disease down the Road, including congestive heart failure, difficult to treat hypertension, cardiac arrhythmias, or stroke. Even type 2 diabetes has, in part, been linked to untreated OSA. Symptoms of untreated OSA include daytime sleepiness, memory problems, mood irritability and mood disorder such as depression and anxiety, lack of energy, as well as recurrent headaches, especially morning headaches. We talked about trying to maintain a healthy lifestyle in general, as well as the importance of weight control. I encouraged the patient to eat healthy, exercise daily and keep well hydrated, to keep a scheduled bedtime and wake time routine, to not skip any meals and eat healthy snacks in between meals. I advised the patient not to drive when feeling sleepy. I recommended the following at this time: sleep study with potential  positive airway pressure titration. (We will score hypopneas at 3% and split the sleep study into diagnostic and treatment portion, if the estimated. 2 hour AHI is >15/h).   I explained the sleep test procedure to the patient and also outlined possible surgical and non-surgical treatment options of OSA, including the use of a custom-made dental device (which would require a referral to a specialist dentist or oral surgeon), upper airway surgical options, such as pillar implants, radiofrequency surgery, tongue base surgery, and UPPP (which would involve a referral to an ENT surgeon). Rarely, jaw surgery such as mandibular advancement may be considered.  I also explained the CPAP treatment option to the patient, who indicated that she would be willing to try CPAP if the need arises. I explained the importance of being compliant with PAP treatment, not only for insurance purposes but primarily to improve Her symptoms, and for the patient's long term health benefit, including to reduce Her cardiovascular risks. I answered all her questions today and the patient was in agreement. I would like to see her back after the sleep study is completed and encouraged her to call with any interim questions, concerns, problems or updates.   Thank you very much for allowing me to participate in the care of this nice patient. If I can be of any further assistance to you please do not hesitate to call me at [phone].  Sincerely,   [name], MD, PhD

## 2016-06-27 NOTE — Patient Instructions (Signed)

## 2016-07-03 DIAGNOSIS — R928 Other abnormal and inconclusive findings on diagnostic imaging of breast: Secondary | ICD-10-CM | POA: Diagnosis not present

## 2016-07-03 DIAGNOSIS — Z1231 Encounter for screening mammogram for malignant neoplasm of breast: Secondary | ICD-10-CM | POA: Diagnosis not present

## 2016-07-11 ENCOUNTER — Telehealth: Payer: Self-pay | Admitting: Neurology

## 2016-07-11 DIAGNOSIS — R0683 Snoring: Secondary | ICD-10-CM

## 2016-07-11 DIAGNOSIS — R400 Somnolence: Secondary | ICD-10-CM

## 2016-07-11 NOTE — Telephone Encounter (Signed)
Order for home sleep test placed.

## 2016-07-11 NOTE — Telephone Encounter (Signed)
[employer] denied Split sleep study but approved HST.  Can I get an order for HST? °

## 2016-07-12 DIAGNOSIS — F3342 Major depressive disorder, recurrent, in full remission: Secondary | ICD-10-CM | POA: Diagnosis not present

## 2016-07-12 DIAGNOSIS — F900 Attention-deficit hyperactivity disorder, predominantly inattentive type: Secondary | ICD-10-CM | POA: Diagnosis not present

## 2016-07-13 ENCOUNTER — Encounter: Payer: Self-pay | Admitting: Cardiology

## 2016-07-13 ENCOUNTER — Ambulatory Visit (INDEPENDENT_AMBULATORY_CARE_PROVIDER_SITE_OTHER): Payer: BLUE CROSS/BLUE SHIELD | Admitting: Cardiology

## 2016-07-13 DIAGNOSIS — R002 Palpitations: Secondary | ICD-10-CM | POA: Diagnosis not present

## 2016-07-13 DIAGNOSIS — R0609 Other forms of dyspnea: Secondary | ICD-10-CM | POA: Diagnosis not present

## 2016-07-13 DIAGNOSIS — R0602 Shortness of breath: Secondary | ICD-10-CM | POA: Insufficient documentation

## 2016-07-13 DIAGNOSIS — R0600 Dyspnea, unspecified: Secondary | ICD-10-CM

## 2016-07-13 NOTE — Progress Notes (Signed)
Cardiology Office Note    Date:  07/13/2016   ID:  [name], [name] 07-21-1970, MRN [id number]  PCP:  Manfred Shirts, PA  Cardiologist:  Fransico Him, MD   Chief Complaint  Patient presents with  . Shortness of Breath  . Irregular Heart Beat    History of Present Illness:  Yvette White is a 46 y.o. female with a remote history of syncope and morbid obesity who is here for cardiac clearance for gastric sleeve surgery.  She has been having some problems with SOB and palpitations recently. She says that when she moves around with any kind of activity she will feel her heart pound hard and get SOB and her face will turn red.  This does not occur every time but occurs at least a few times weekly.  She says that she will feel pressure on her chest during this time but denies any diaphoresis or nausea.  She will get some vertigo on occasion.  She has not had any pre syncope or syncope in the past 10 years.  She does have chronic LE edema. She did have uterine polyps removed recently under anesthesia without any problems.    Past Medical History:  Diagnosis Date  . ADD (attention deficit disorder)   . Anemia   . Anxiety   . Arthritis   . Depression   . DOE (dyspnea on exertion) 07/13/2016  . GERD (gastroesophageal reflux disease)   . Headache    Migraines  . Heart murmur   . Hyperthyroidism   . Morbid obesity ([place])   . Neurocardiogenic syncope   . Pre-diabetes   . Sleep disorder   . Vitamin D deficiency     Past Surgical History:  Procedure Laterality Date  . DILATATION & CURETTAGE/HYSTEROSCOPY WITH MYOSURE N/A 06/08/2016   Procedure: DILATATION & CURETTAGE/HYSTEROSCOPY;  Surgeon: Louretta Shorten, MD;  Location: [facility];  Service: Gynecology;  Laterality: N/A;  . TILT TABLE STUDY    . URETHRAL DILATION     age 70  . WISDOM TOOTH EXTRACTION      Current Medications: Current Meds  Medication Sig  . acetaminophen (TYLENOL) 500 MG tablet Take 1,000 mg by mouth every 6 (six)  hours as needed (for pain.).  Marland Kitchen Calcium Carbonate Antacid (ANTACID PO) Take 1 tablet by mouth at bedtime as needed (for breakthrough acid reflux.).  Marland Kitchen clonazePAM (KLONOPIN) 2 MG tablet Take 2 mg by mouth daily as needed (for severe anxiety).   Marland Kitchen lamoTRIgine (LAMICTAL) 200 MG tablet Take 200 mg by mouth every evening.   . loratadine (CLARITIN) 10 MG tablet Take 10 mg by mouth every evening.  . meloxicam (MOBIC) 15 MG tablet Take 15 mg by mouth daily.  Marland Kitchen omeprazole (PRILOSEC) 40 MG capsule Take 40 mg by mouth daily.  . Vitamin D, Ergocalciferol, (DRISDOL) 50000 units CAPS capsule Take 50,000 Units by mouth once a week.    Allergies:   Theophylline; Penicillins; Pseudoephedrine; and Sulfamethoxazole-trimethoprim   Social History   Social History  . Marital status: Single    Spouse name: N/A  . Number of children: 0  . Years of education: BA   Social History Main Topics  . Smoking status: Former Smoker    Packs/day: 0.25    Years: 20.00    Types: Cigarettes    Quit date: 09/30/2014  . Smokeless tobacco: Never Used  . Alcohol use Yes     Comment: occ  . Drug use: No  . Sexual activity:  Not on file   Other Topics Concern  . Not on file   Social History Narrative   Drinks 1-2 caffeine drinks a day      Family History:  The patient's family history includes Atrial fibrillation in her maternal grandmother; Colon cancer (age of onset: 57) in her maternal grandfather; Diabetes in her father and mother; Heart attack (age of onset: 67) in her father; Heart disease in her father and mother; Parkinson's disease in her maternal grandmother; Uterine cancer in her mother.   ROS:   Please see the history of present illness.    ROS All other systems reviewed and are negative.  No flowsheet data found.     PHYSICAL EXAM:   VS:  BP 108/60   Pulse 60   Ht 5\' 5"  (1.651 m)   Wt (!) 375 lb 6.4 oz (170.3 kg)   BMI 62.47 kg/m    GEN: Well nourished, well developed, in no acute distress    HEENT: normal  Neck: no JVD, carotid bruits, or masses Cardiac: RRR; no murmurs, rubs, or gallops,no edema.  Intact distal pulses bilaterally.  Respiratory:  clear to auscultation bilaterally, normal work of breathing GI: soft, nontender, nondistended, + BS MS: no deformity or atrophy  Skin: warm and dry, no rash Neuro:  Alert and Oriented x 3, Strength and sensation are intact Psych: euthymic mood, full affect  Wt Readings from Last 3 Encounters:  07/13/16 (!) 375 lb 6.4 oz (170.3 kg)  06/27/16 (!) 375 lb (170.1 kg)  06/07/16 (!) 374 lb 2 oz (169.7 kg)      Studies/Labs Reviewed:   EKG:  EKG is ordered today.  The ekg ordered today demonstrates NSR at 60bpm with no ST changes.  Recent Labs: 06/01/2016: Hemoglobin 11.7; Platelets 271   Lipid Panel No results found for: CHOL, TRIG, HDL, CHOLHDL, VLDL, LDLCALC, LDLDIRECT  Additional studies/ records that were reviewed today include:  none    ASSESSMENT:    1. Morbid obesity ([place])   2. Heart palpitations   3. DOE (dyspnea on exertion)      PLAN:  In order of problems listed above:  1. Morbid obesity - she is undergoing workup for gastric sleeve surgery needing preoperative cardiac clearance. 2. Heart palpitations that occur with activity - suspect this is due to deconditioning from morbid obesity. I will get a event monitor to assess for arrhythmia. 3. DOE - this is likely related to morbid obesity and deconditioning.  She does have a family history of CAD and remote tobacco use - she quit 2 years ago.  Her EKG is normal.  I will proceed with ETT as a nuclear stress test or stress echo will be of low yield given her morbid obesity and will likely be nondiagnostic. I will get an echo to assess LVF.    Medication Adjustments/Labs and Tests Ordered: Current medicines are reviewed at length with the patient today.  Concerns regarding medicines are outlined above.  Medication changes, Labs and Tests ordered today are listed  in the Patient Instructions below.  There are no Patient Instructions on file for this visit.   Signed, Fransico Him, MD  07/13/2016 10:49 AM    [facility] [address], [place], [place]  [address] Phone: [phone]; Fax: [phone]

## 2016-07-13 NOTE — Patient Instructions (Signed)
Medication Instructions:  Your physician recommends that you continue on your current medications as directed. Please refer to the Current Medication list given to you today.   Labwork: None  Testing/Procedures: Your physician has requested that you have an exercise tolerance test. For further information please visit HugeFiesta.[email or web address]. Please also follow instruction sheet, as given.  Your physician has requested that you have an echocardiogram. Echocardiography is a painless test that uses sound waves to create images of your heart. It provides your doctor with information about the size and shape of your heart and how well your heart's chambers and valves are working. This procedure takes approximately one hour. There are no restrictions for this procedure.  Follow-Up: Your physician recommends that you schedule a follow-up appointment AS NEEDED with Dr. Radford Pax pending study results.  Any Other Special Instructions Will Be Listed Below (If Applicable).     If you need a refill on your cardiac medications before your next appointment, please call your pharmacy.

## 2016-07-19 DIAGNOSIS — N926 Irregular menstruation, unspecified: Secondary | ICD-10-CM | POA: Diagnosis not present

## 2016-07-19 DIAGNOSIS — K219 Gastro-esophageal reflux disease without esophagitis: Secondary | ICD-10-CM | POA: Diagnosis not present

## 2016-07-19 DIAGNOSIS — Z6841 Body Mass Index (BMI) 40.0 and over, adult: Secondary | ICD-10-CM | POA: Diagnosis not present

## 2016-08-01 ENCOUNTER — Ambulatory Visit (INDEPENDENT_AMBULATORY_CARE_PROVIDER_SITE_OTHER): Payer: BLUE CROSS/BLUE SHIELD | Admitting: Internal Medicine

## 2016-08-01 ENCOUNTER — Encounter: Payer: Self-pay | Admitting: Internal Medicine

## 2016-08-01 VITALS — BP 130/82 | HR 60 | Ht 65.0 in | Wt 379.0 lb

## 2016-08-01 DIAGNOSIS — K219 Gastro-esophageal reflux disease without esophagitis: Secondary | ICD-10-CM | POA: Diagnosis not present

## 2016-08-01 DIAGNOSIS — R1310 Dysphagia, unspecified: Secondary | ICD-10-CM | POA: Diagnosis not present

## 2016-08-01 NOTE — Patient Instructions (Signed)
You have been scheduled for a Barium Esophogram at [facility] on 08/07/2016 at 9;30am. Please arrive 15 minutes prior to your appointment for registration. Make certain not to have anything to eat or drink 6 hours prior to your test. If you need to reschedule for any reason, please contact radiology at [phone] to do so. __________________________________________________________________ A barium swallow is an examination that concentrates on views of the esophagus. This tends to be a double contrast exam (barium and two liquids which, when combined, create a gas to distend the wall of the oesophagus) or single contrast (non-ionic iodine based). The study is usually tailored to your symptoms so a good history is essential. Attention is paid during the study to the form, structure and configuration of the esophagus, looking for functional disorders (such as aspiration, dysphagia, achalasia, motility and reflux) EXAMINATION You may be asked to change into a gown, depending on the type of swallow being performed. A radiologist and radiographer will perform the procedure. The radiologist will advise you of the type of contrast selected for your procedure and direct you during the exam. You will be asked to stand, sit or lie in several different positions and to hold a small amount of fluid in your mouth before being asked to swallow while the imaging is performed .In some instances you may be asked to swallow barium coated marshmallows to assess the motility of a solid food bolus. The exam can be recorded as a digital or video fluoroscopy procedure. POST PROCEDURE It will take 1-2 days for the barium to pass through your system. To facilitate this, it is important, unless otherwise directed, to increase your fluids for the next 24-48hrs and to resume your normal diet.  This test typically takes about 30 minutes to  perform. __________________________________________________________________________________  Yvette White have been scheduled for an endoscopy. Please follow written instructions given to you at your visit today. If you use inhalers (even only as needed), please bring them with you on the day of your procedure. Your physician has requested that you go to www.startemmi.com and enter the access code given to you at your visit today. This web site gives a general overview about your procedure. However, you should still follow specific instructions given to you by our office regarding your preparation for the procedure.

## 2016-08-01 NOTE — Progress Notes (Signed)
HISTORY OF PRESENT ILLNESS:  Yvette White is a 46 y.o. female , biology teaching assistant at [employer], with morbid obesity (BMI 63) who is referred today by [place] Beane, PA-C with chief complaints of dysphagia, acid reflux, and flatulence. Patient tells me that she is contemplating bariatric surgery and it was requested that she have an upper endoscopy. The patient reports long-standing problems with classic reflux symptoms as manifested by pyrosis and regurgitation, particularly at night. She had been using over-the-counter therapy with inadequate relief. She reports a greater than one-year history of dysphagia to pills and solids greater than liquids. This has occurred near daily. Last August she was placed on omeprazole 40 mg daily. This did help her reflux symptoms significantly though she does require antacids once or twice per week for breakthrough symptoms. She is not had prior upper endoscopy. No weight loss. Finally, she reports increased intestinal gas as manifested by foul-smelling flatus. Bowel habits are regular. No other GI complaints. She does take chronic NSAIDs for back pain and/or joint aches  REVIEW OF SYSTEMS:  All non-GI ROS negative except for sinus and allergy, anxiety, arthritis, depression, fatigue, headaches, heart murmur, menstrual cramps, ankle swelling, urinary leakage, back pain  Past Medical History:  Diagnosis Date  . ADD (attention deficit disorder)   . Anemia   . Anxiety   . Arthritis   . Depression   . DOE (dyspnea on exertion) 07/13/2016  . GERD (gastroesophageal reflux disease)   . Headache    Migraines  . Heart murmur   . Hyperthyroidism   . Morbid obesity (HCC)   . Neurocardiogenic syncope   . Pre-diabetes   . Sleep disorder   . Vitamin D deficiency     Past Surgical History:  Procedure Laterality Date  . DILATATION & CURETTAGE/HYSTEROSCOPY WITH MYOSURE N/A 06/08/2016   Procedure: DILATATION & CURETTAGE/HYSTEROSCOPY;  Surgeon: David Lowe,  MD;  Location: [facility];  Service: Gynecology;  Laterality: N/A;  . TILT TABLE STUDY    . URETHRAL DILATION     age 7  . WISDOM TOOTH EXTRACTION      Social History Fatma J Graffius  reports that she quit smoking about 22 months ago. Her smoking use included Cigarettes. She has a 5.00 pack-year smoking history. She has never used smokeless tobacco. She reports that she drinks alcohol. She reports that she does not use drugs.  family history includes Atrial fibrillation in her maternal grandmother; Colon cancer (age of onset: 70) in her maternal grandfather; Diabetes in her father and mother; Heart attack (age of onset: 48) in her father; Heart disease in her father and mother; Parkinson's disease in her maternal grandmother; Uterine cancer in her mother.  Allergies  Allergen Reactions  . Theophylline     Other reaction(s): Other (See Comments) Hyperactive  "Slo phylinne"  . Penicillins Rash    Has patient had a PCN reaction causing immediate rash, facial/tongue/throat swelling, SOB or lightheadedness with hypotension:unsure Has patient had a PCN reaction causing severe rash involving mucus membranes or skin necrosis:unsure Has patient had a PCN reaction that required hospitalization:No Has patient had a PCN reaction occurring within the last 10 years: No If all of the above answers are "NO", then may proceed with Cephalosporin use.   . Pseudoephedrine Rash  . Sulfamethoxazole-Trimethoprim Rash       PHYSICAL EXAMINATION: Vital signs: BP 130/82 (Cuff Size: Large)   Pulse 60   Ht 5' 5" (1.651 m)   Wt (!) 379 lb (171.9 kg)     LMP  (LMP Unknown)   BMI 63.07 kg/m   Constitutional:Pleasant, obese, but generally well-appearing, no acute distress Psychiatric: alert and oriented x3, cooperative Eyes: extraocular movements intact, anicteric, conjunctiva pink Mouth: oral pharynx moist, no lesions Neck: supple no lymphadenopathy Cardiovascular: heart regular rate and rhythm, no  murmur Lungs: clear to auscultation bilaterally Abdomen: soft, obese, nontender, nondistended, no obvious ascites, no peritoneal signs, normal bowel sounds, no organomegaly Rectal: Omitted Extremities: no clubbing cyanosis or lower extremity edema bilaterally Skin: no lesions on visible extremities Neuro: No focal deficits. Cranial nerves intact  ASSESSMENT:  #1. Chronic GERD. Incomplete relief with PPI #2. Dysphagia. Rule out peptic stricture. Rule out eosinophilic esophagitis #3. Increased intestinal gas as manifested by foul-smelling flatus. No alarm features #4. Morbid obesity  PLAN:  #1. Reflux precautions with attention to weight loss #2. Continue PPI #3. Obtain barium esophagram with tablet. We will contact her with results #4. Schedule upper endoscopy with probable esophageal dilation and probable biopsies to rule out H. pylori at the hospital with anesthesia (MAC) given her body habitus. She is high-risk.The nature of the procedure, as well as the risks, benefits, and alternatives were carefully and thoroughly reviewed with the patient. Ample time for discussion and questions allowed. The patient understood, was satisfied, and agreed to proceed. #5. Recommend probiotic to see if this helps her intestinal gas #6. Discussion on intestinal gas. Questions answered   A copy of this consultation note has been sent to Levy Beane PA-C 

## 2016-08-02 ENCOUNTER — Ambulatory Visit (HOSPITAL_COMMUNITY): Payer: BLUE CROSS/BLUE SHIELD | Attending: Cardiology

## 2016-08-02 ENCOUNTER — Other Ambulatory Visit: Payer: Self-pay

## 2016-08-02 ENCOUNTER — Ambulatory Visit (INDEPENDENT_AMBULATORY_CARE_PROVIDER_SITE_OTHER): Payer: BLUE CROSS/BLUE SHIELD

## 2016-08-02 DIAGNOSIS — Z8249 Family history of ischemic heart disease and other diseases of the circulatory system: Secondary | ICD-10-CM | POA: Insufficient documentation

## 2016-08-02 DIAGNOSIS — Z6841 Body Mass Index (BMI) 40.0 and over, adult: Secondary | ICD-10-CM | POA: Diagnosis not present

## 2016-08-02 DIAGNOSIS — R0600 Dyspnea, unspecified: Secondary | ICD-10-CM

## 2016-08-02 DIAGNOSIS — Z87891 Personal history of nicotine dependence: Secondary | ICD-10-CM | POA: Diagnosis not present

## 2016-08-02 DIAGNOSIS — R0609 Other forms of dyspnea: Secondary | ICD-10-CM | POA: Insufficient documentation

## 2016-08-02 LAB — EXERCISE TOLERANCE TEST
CHL CUP STRESS STAGE 1 HR: 72 {beats}/min
CHL CUP STRESS STAGE 1 SBP: 151 mmHg
CHL CUP STRESS STAGE 2 GRADE: 0 %
CHL CUP STRESS STAGE 4 GRADE: 10 %
CHL CUP STRESS STAGE 4 HR: 126 {beats}/min
CHL CUP STRESS STAGE 4 SPEED: 1.7 mph
CHL CUP STRESS STAGE 5 DBP: 87 mmHg
CHL CUP STRESS STAGE 5 GRADE: 12 %
CHL CUP STRESS STAGE 5 HR: 155 {beats}/min
CHL CUP STRESS STAGE 5 SBP: 206 mmHg
CHL CUP STRESS STAGE 6 DBP: 82 mmHg
CHL CUP STRESS STAGE 6 SBP: 225 mmHg
CHL RATE OF PERCEIVED EXERTION: 19
CSEPEDS: 32 s
CSEPEW: 6.5 METS
CSEPPHR: 155 {beats}/min
CSEPPMHR: 88 %
Exercise duration (min): 4 min
MPHR: 176 {beats}/min
Peak BP: 206 mmHg
Percent HR: 89 %
Rest HR: 58 {beats}/min
Stage 1 DBP: 82 mmHg
Stage 1 Grade: 0 %
Stage 1 Speed: 0 mph
Stage 2 HR: 82 {beats}/min
Stage 2 Speed: 1 mph
Stage 3 Grade: 0.2 %
Stage 3 HR: 82 {beats}/min
Stage 3 Speed: 1 mph
Stage 5 Speed: 2.6 mph
Stage 6 Grade: 0 %
Stage 6 HR: 130 {beats}/min
Stage 6 Speed: 0 mph
Stage 7 DBP: 83 mmHg
Stage 7 Grade: 0 %
Stage 7 HR: 73 {beats}/min
Stage 7 SBP: 190 mmHg
Stage 7 Speed: 0 mph

## 2016-08-03 ENCOUNTER — Telehealth: Payer: Self-pay | Admitting: Cardiology

## 2016-08-03 DIAGNOSIS — R9439 Abnormal result of other cardiovascular function study: Secondary | ICD-10-CM

## 2016-08-03 NOTE — Telephone Encounter (Signed)
-----   Message from Sueanne Margarita, MD sent at 08/02/2016  8:55 PM EST ----- Echo showed normal LVF with mild LVH, increased stiffness of heart muscle

## 2016-08-03 NOTE — Telephone Encounter (Signed)
-----   Message from Sueanne Margarita, MD sent at 08/02/2016  1:21 PM EST ----- Disregard stress echo as she is morbidly obese.  Please set up stress myoview to rule out ischemia.

## 2016-08-03 NOTE — Telephone Encounter (Signed)
Informed patient of echo and GXT results and verbal understanding expressed.   Patient agrees to stress myoview. She understands it will be a 2-day test, she will need to fast 2 hours prior and come dressed prepared to exercise. She agrees with treatment plan.

## 2016-08-03 NOTE — Telephone Encounter (Signed)
F/U meesage  Yvette White is Returning your call . Thanks

## 2016-08-03 NOTE — Telephone Encounter (Signed)
F/U message  Pt returning RN call about results for ETT. Please call back to discuss

## 2016-08-07 ENCOUNTER — Other Ambulatory Visit: Payer: Self-pay

## 2016-08-07 ENCOUNTER — Ambulatory Visit (HOSPITAL_COMMUNITY)
Admission: RE | Admit: 2016-08-07 | Discharge: 2016-08-07 | Disposition: A | Discharge status: Home / Self Care | Payer: BLUE CROSS/BLUE SHIELD | Source: Ambulatory Visit | Attending: Internal Medicine | Admitting: Internal Medicine

## 2016-08-07 DIAGNOSIS — K219 Gastro-esophageal reflux disease without esophagitis: Secondary | ICD-10-CM | POA: Diagnosis not present

## 2016-08-07 DIAGNOSIS — R1310 Dysphagia, unspecified: Secondary | ICD-10-CM | POA: Diagnosis not present

## 2016-08-14 ENCOUNTER — Encounter (HOSPITAL_COMMUNITY): Payer: Self-pay | Admitting: *Deleted

## 2016-08-15 DIAGNOSIS — M25532 Pain in left wrist: Secondary | ICD-10-CM | POA: Diagnosis not present

## 2016-08-15 DIAGNOSIS — G5601 Carpal tunnel syndrome, right upper limb: Secondary | ICD-10-CM | POA: Diagnosis not present

## 2016-08-15 DIAGNOSIS — M25531 Pain in right wrist: Secondary | ICD-10-CM | POA: Diagnosis not present

## 2016-08-15 DIAGNOSIS — G5602 Carpal tunnel syndrome, left upper limb: Secondary | ICD-10-CM | POA: Diagnosis not present

## 2016-08-16 ENCOUNTER — Encounter (HOSPITAL_COMMUNITY): Payer: Self-pay | Admitting: Anesthesiology

## 2016-08-16 ENCOUNTER — Encounter (HOSPITAL_COMMUNITY)
Admission: RE | Disposition: A | Discharge status: Home / Self Care | Payer: Self-pay | Source: Ambulatory Visit | Attending: Internal Medicine

## 2016-08-16 ENCOUNTER — Ambulatory Visit (HOSPITAL_COMMUNITY)
Admission: RE | Admit: 2016-08-16 | Discharge: 2016-08-16 | Disposition: A | Discharge status: Home / Self Care | Payer: BLUE CROSS/BLUE SHIELD | Source: Ambulatory Visit | Attending: Internal Medicine | Admitting: Internal Medicine

## 2016-08-16 ENCOUNTER — Encounter (HOSPITAL_COMMUNITY): Payer: Self-pay | Admitting: Internal Medicine

## 2016-08-16 DIAGNOSIS — E559 Vitamin D deficiency, unspecified: Secondary | ICD-10-CM | POA: Insufficient documentation

## 2016-08-16 DIAGNOSIS — F419 Anxiety disorder, unspecified: Secondary | ICD-10-CM | POA: Diagnosis not present

## 2016-08-16 DIAGNOSIS — Z87891 Personal history of nicotine dependence: Secondary | ICD-10-CM | POA: Diagnosis not present

## 2016-08-16 DIAGNOSIS — E0590 Thyrotoxicosis, unspecified without thyrotoxic crisis or storm: Secondary | ICD-10-CM | POA: Diagnosis not present

## 2016-08-16 DIAGNOSIS — F329 Major depressive disorder, single episode, unspecified: Secondary | ICD-10-CM | POA: Insufficient documentation

## 2016-08-16 DIAGNOSIS — Z538 Procedure and treatment not carried out for other reasons: Secondary | ICD-10-CM | POA: Diagnosis not present

## 2016-08-16 DIAGNOSIS — R1310 Dysphagia, unspecified: Secondary | ICD-10-CM | POA: Diagnosis not present

## 2016-08-16 DIAGNOSIS — K219 Gastro-esophageal reflux disease without esophagitis: Secondary | ICD-10-CM | POA: Insufficient documentation

## 2016-08-16 DIAGNOSIS — Z791 Long term (current) use of non-steroidal anti-inflammatories (NSAID): Secondary | ICD-10-CM | POA: Diagnosis not present

## 2016-08-16 DIAGNOSIS — Z79899 Other long term (current) drug therapy: Secondary | ICD-10-CM | POA: Diagnosis not present

## 2016-08-16 DIAGNOSIS — Z800 Family history of malignant neoplasm of digestive organs: Secondary | ICD-10-CM | POA: Diagnosis not present

## 2016-08-16 DIAGNOSIS — Z6841 Body Mass Index (BMI) 40.0 and over, adult: Secondary | ICD-10-CM | POA: Diagnosis not present

## 2016-08-16 SURGERY — CANCELLED PROCEDURE

## 2016-08-16 MED ORDER — PROPOFOL 10 MG/ML IV BOLUS
INTRAVENOUS | Status: AC
  Filled 2016-08-16: qty 20

## 2016-08-16 NOTE — Anesthesia Preprocedure Evaluation (Deleted)
Anesthesia Evaluation  Patient identified by MRN, date of birth, ID band Patient awake    Reviewed: Allergy & Precautions, NPO status , Patient's Chart, lab work & pertinent test results  Airway        Dental   Pulmonary former smoker,           Cardiovascular + DOE       Neuro/Psych  Headaches, Anxiety Depression    GI/Hepatic Neg liver ROS, GERD  ,  Endo/Other  Morbid obesity  Renal/GU negative Renal ROS     Musculoskeletal  (+) Arthritis ,   Abdominal   Peds  Hematology negative hematology ROS (+)   Anesthesia Other Findings   Reproductive/Obstetrics                             Anesthesia Physical Anesthesia Plan  ASA: III  Anesthesia Plan: MAC   Post-op Pain Management:    Induction: Intravenous  Airway Management Planned: Natural Airway and Nasal Cannula  Additional Equipment:   Intra-op Plan:   Post-operative Plan:   Informed Consent: I have reviewed the patients History and Physical, chart, labs and discussed the procedure including the risks, benefits and alternatives for the proposed anesthesia with the patient or authorized representative who has indicated his/her understanding and acceptance.     Plan Discussed with: CRNA  Anesthesia Plan Comments:         Anesthesia Quick Evaluation

## 2016-08-16 NOTE — Interval H&P Note (Signed)
History and Physical Interval Note:  08/16/2016 12:51 PM  Yvette White  has presented today for surgery, with the diagnosis of gerd; dysphagia  The various methods of treatment have been discussed with the patient and family. After consideration of risks, benefits and other options for treatment, the patient has consented to  Procedure(s): CANCELLED PROCEDURE as a surgical intervention .  The patient's history has been reviewed, patient examined, no change in status, stable for surgery.  I have reviewed the patient's chart and labs.  Questions were answered to the patient's satisfaction.    As above. Procedure canceled by anesthesia until cardiac status clarified. She has been asked contact my office after receiving cardiac clearance to reschedule her endoscopy.  Docia Chuck. Geri Seminole., M.D. [facility] of Gastroenterology   [name]

## 2016-08-16 NOTE — Interval H&P Note (Signed)
History and Physical Interval Note:  08/16/2016 12:23 PM  Yvette White  has presented today for surgery, with the diagnosis of gerd; dysphagia  The various methods of treatment have been discussed with the patient and family. After consideration of risks, benefits and other options for treatment, the patient has consented to  Procedure(s): ESOPHAGOGASTRODUODENOSCOPY (EGD) (N/A) as a surgical intervention, with possible esophageal dilation .  The patient's history has been reviewed, patient examined, no change in status, stable for surgery.  I have reviewed the patient's chart and labs.  Questions were answered to the patient's satisfaction.     [name]

## 2016-08-16 NOTE — H&P (View-Only) (Signed)
HISTORY OF PRESENT ILLNESS:  Yvette White is a 46 y.o. 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 Patient tells me that she is contemplating bariatric surgery and it was requested that she have an upper endoscopy. The patient reports long-standing problems with classic reflux symptoms as manifested by pyrosis and regurgitation, particularly at night. She had been using over-the-counter therapy with inadequate relief. She reports a greater than one-year history of dysphagia to pills and solids greater than liquids. This has occurred near daily. Last August she was placed on omeprazole 40 mg daily. This did help her reflux symptoms significantly though she does require antacids once or twice per week for breakthrough symptoms. She is not had prior upper endoscopy. No weight loss. Finally, she reports increased intestinal gas as manifested by foul-smelling flatus. Bowel habits are regular. No other GI complaints. She does take chronic NSAIDs for back pain and/or joint aches  REVIEW OF SYSTEMS:  All non-GI ROS negative except for sinus and allergy, anxiety, arthritis, depression, fatigue, headaches, heart murmur, menstrual cramps, ankle swelling, urinary leakage, back pain  Past Medical History:  Diagnosis Date  . ADD (attention deficit disorder)   . Anemia   . Anxiety   . Arthritis   . Depression   . DOE (dyspnea on exertion) 07/13/2016  . GERD (gastroesophageal reflux disease)   . Headache    Migraines  . Heart murmur   . Hyperthyroidism   . Morbid obesity ([place])   . Neurocardiogenic syncope   . Pre-diabetes   . Sleep disorder   . Vitamin D deficiency     Past Surgical History:  Procedure Laterality Date  . DILATATION & CURETTAGE/HYSTEROSCOPY WITH MYOSURE N/A 06/08/2016   Procedure: DILATATION & CURETTAGE/HYSTEROSCOPY;  Surgeon: Louretta Shorten,  MD;  Location: [facility];  Service: Gynecology;  Laterality: N/A;  . TILT TABLE STUDY    . URETHRAL DILATION     age 49  . WISDOM TOOTH EXTRACTION      Social History [name]  reports that she quit smoking about 22 months ago. Her smoking use included Cigarettes. She has a 5.00 pack-year smoking history. She has never used smokeless tobacco. She reports that she drinks alcohol. She reports that she does not use drugs.  family history includes Atrial fibrillation in her maternal grandmother; Colon cancer (age of onset: 30) in her maternal grandfather; Diabetes in her father and mother; Heart attack (age of onset: 5) in her father; Heart disease in her father and mother; Parkinson's disease in her maternal grandmother; Uterine cancer in her mother.  Allergies  Allergen Reactions  . Theophylline     Other reaction(s): Other (See Comments) Hyperactive  "Slo phylinne"  . Penicillins Rash    Has patient had a PCN reaction causing immediate rash, facial/tongue/throat swelling, SOB or lightheadedness with hypotension:unsure Has patient had a PCN reaction causing severe rash involving mucus membranes or skin necrosis:unsure Has patient had a PCN reaction that required hospitalization:No Has patient had a PCN reaction occurring within the last 10 years: No If all of the above answers are "NO", then may proceed with Cephalosporin use.   . Pseudoephedrine Rash  . Sulfamethoxazole-Trimethoprim Rash       PHYSICAL EXAMINATION: Vital signs: BP 130/82 (Cuff Size: Large)   Pulse 60   Ht _0  (1.651 m)   Wt (!) 379 lb (171.9 kg)  LMP  (LMP Unknown)   BMI 63.07 kg/m   Constitutional:Pleasant, obese, but generally well-appearing, no acute distress Psychiatric: alert and oriented x3, cooperative Eyes: extraocular movements intact, anicteric, conjunctiva pink Mouth: oral pharynx moist, no lesions Neck: supple no lymphadenopathy Cardiovascular: heart regular rate and rhythm, no  murmur Lungs: clear to auscultation bilaterally Abdomen: soft, obese, nontender, nondistended, no obvious ascites, no peritoneal signs, normal bowel sounds, no organomegaly Rectal: Omitted Extremities: no clubbing cyanosis or lower extremity edema bilaterally Skin: no lesions on visible extremities Neuro: No focal deficits. Cranial nerves intact  ASSESSMENT:  #1. Chronic GERD. Incomplete relief with PPI #2. Dysphagia. Rule out peptic stricture. Rule out eosinophilic esophagitis #3. Increased intestinal gas as manifested by foul-smelling flatus. No alarm features #4. Morbid obesity  PLAN:  #1. Reflux precautions with attention to weight loss #2. Continue PPI #3. Obtain barium esophagram with tablet. We will contact her with results #4. Schedule upper endoscopy with probable esophageal dilation and probable biopsies to rule out H. pylori at the hospital with anesthesia (MAC) given her body habitus. She is high-risk.The nature of the procedure, as well as the risks, benefits, and alternatives were carefully and thoroughly reviewed with the patient. Ample time for discussion and questions allowed. The patient understood, was satisfied, and agreed to proceed. #5. Recommend probiotic to see if this helps her intestinal gas #6. Discussion on intestinal gas. Questions answered   A copy of this consultation note has been sent to [employer] PA-C

## 2016-08-17 ENCOUNTER — Telehealth: Payer: Self-pay

## 2016-08-17 NOTE — Telephone Encounter (Signed)
-----   Message from Irene Shipper, MD sent at 08/16/2016 12:52 PM EDT ----- Regarding: Today's procedure canceled Mannie Stabile. This patient arrived [place] for her EGD. Her case was canceled by anesthesia as she is anticipating additional noninvasive cardiac testing. She will call us after receiving cardiac clearance to reschedule outpatient elective EGD at the hospital. Thanks

## 2016-08-17 NOTE — Telephone Encounter (Signed)
Noted  

## 2016-08-23 ENCOUNTER — Ambulatory Visit (HOSPITAL_COMMUNITY): Payer: Self-pay

## 2016-08-24 ENCOUNTER — Ambulatory Visit (HOSPITAL_COMMUNITY): Payer: Self-pay

## 2016-08-28 ENCOUNTER — Telehealth (HOSPITAL_COMMUNITY): Payer: Self-pay | Admitting: *Deleted

## 2016-08-28 NOTE — Telephone Encounter (Signed)
Patient given detailed instructions per Myocardial Perfusion Study Information Sheet for the test on 08/30/16 at 1230. Patient notified to arrive 15 minutes early and that it is imperative to arrive on time for appointment to keep from having the test rescheduled.  If you need to cancel or reschedule your appointment, please call the office within 24 hours of your appointment. Failure to do so may result in a cancellation of your appointment, and a $50 no show fee. Patient verbalized understanding.Fantasha Daniele, Ranae Palms

## 2016-08-30 ENCOUNTER — Ambulatory Visit (HOSPITAL_COMMUNITY): Payer: BLUE CROSS/BLUE SHIELD | Attending: Internal Medicine

## 2016-08-30 DIAGNOSIS — R9439 Abnormal result of other cardiovascular function study: Secondary | ICD-10-CM | POA: Diagnosis not present

## 2016-08-30 DIAGNOSIS — R002 Palpitations: Secondary | ICD-10-CM | POA: Insufficient documentation

## 2016-08-30 DIAGNOSIS — R079 Chest pain, unspecified: Secondary | ICD-10-CM | POA: Insufficient documentation

## 2016-08-30 DIAGNOSIS — Z6841 Body Mass Index (BMI) 40.0 and over, adult: Secondary | ICD-10-CM | POA: Insufficient documentation

## 2016-08-30 DIAGNOSIS — R0609 Other forms of dyspnea: Secondary | ICD-10-CM | POA: Insufficient documentation

## 2016-08-30 DIAGNOSIS — Z8249 Family history of ischemic heart disease and other diseases of the circulatory system: Secondary | ICD-10-CM | POA: Insufficient documentation

## 2016-08-30 MED ORDER — REGADENOSON 0.4 MG/5ML IV SOLN: 0.4000 mg | Freq: Once | INTRAVENOUS | Status: DC

## 2016-08-30 MED ORDER — TECHNETIUM TC 99M TETROFOSMIN IV KIT
32.1000 | PACK | Freq: Once | INTRAVENOUS | Status: AC | PRN
  Administered 2016-08-30: 32.1 via INTRAVENOUS
  Filled 2016-08-30: qty 33

## 2016-08-31 ENCOUNTER — Ambulatory Visit (HOSPITAL_COMMUNITY): Payer: BLUE CROSS/BLUE SHIELD | Attending: Cardiovascular Disease

## 2016-08-31 LAB — MYOCARDIAL PERFUSION IMAGING
CHL CUP MPHR: 176 {beats}/min
CHL CUP NUCLEAR SRS: 3
CHL CUP NUCLEAR SSS: 6
CHL CUP RESTING HR STRESS: 58 {beats}/min
CSEPED: 5 min
CSEPHR: 88 %
Estimated workload: 7.2 METS
Exercise duration (sec): 30 s
LV dias vol: 126 mL (ref 46–106)
LV sys vol: 56 mL
NUC STRESS TID: 1.05
Peak HR: 155 {beats}/min
RATE: 0.31
SDS: 3

## 2016-08-31 MED ORDER — TECHNETIUM TC 99M TETROFOSMIN IV KIT
33.0000 | PACK | Freq: Once | INTRAVENOUS | Status: AC | PRN
  Administered 2016-08-31: 33 via INTRAVENOUS
  Filled 2016-08-31: qty 33

## 2016-09-04 DIAGNOSIS — G5601 Carpal tunnel syndrome, right upper limb: Secondary | ICD-10-CM | POA: Diagnosis not present

## 2016-09-04 DIAGNOSIS — M25532 Pain in left wrist: Secondary | ICD-10-CM | POA: Diagnosis not present

## 2016-09-04 DIAGNOSIS — M25531 Pain in right wrist: Secondary | ICD-10-CM | POA: Diagnosis not present

## 2016-09-04 DIAGNOSIS — G5602 Carpal tunnel syndrome, left upper limb: Secondary | ICD-10-CM | POA: Diagnosis not present

## 2016-09-05 DIAGNOSIS — K219 Gastro-esophageal reflux disease without esophagitis: Secondary | ICD-10-CM | POA: Diagnosis not present

## 2016-09-05 DIAGNOSIS — Z6841 Body Mass Index (BMI) 40.0 and over, adult: Secondary | ICD-10-CM | POA: Diagnosis not present

## 2016-09-11 DIAGNOSIS — G5602 Carpal tunnel syndrome, left upper limb: Secondary | ICD-10-CM | POA: Diagnosis not present

## 2016-09-11 DIAGNOSIS — G5601 Carpal tunnel syndrome, right upper limb: Secondary | ICD-10-CM | POA: Diagnosis not present

## 2016-09-13 ENCOUNTER — Ambulatory Visit (INDEPENDENT_AMBULATORY_CARE_PROVIDER_SITE_OTHER): Payer: BLUE CROSS/BLUE SHIELD | Admitting: Neurology

## 2016-09-13 DIAGNOSIS — G4710 Hypersomnia, unspecified: Secondary | ICD-10-CM | POA: Diagnosis not present

## 2016-09-13 DIAGNOSIS — G4733 Obstructive sleep apnea (adult) (pediatric): Secondary | ICD-10-CM

## 2016-09-19 ENCOUNTER — Telehealth: Payer: Self-pay

## 2016-09-19 NOTE — Addendum Note (Signed)
Addended by: [name] on: 09/19/2016 08:03 AM   Modules accepted: Orders

## 2016-09-19 NOTE — Progress Notes (Signed)
Patient referred by Ms. Beane, seen by me on 06/27/16, HST on 09/13/16.  Please call and notify the patient that the recent HST showed borderline/mild obstructive sleep apnea. It may be treating to see if she feels better after treatment. To that end I recommend treatment for this in the form of autoPAP, which means, that we don't have to bring her back for a sleep study with CPAP, but will let her try an autoPAP machine at home, through a DME company (of her choice, or as per insurance requirement). The DME representative will educate her on how to use the machine, how to put the mask on, etc. I have placed an order in the chart. Please send referral, talk to patient, send report to PCP and referring MD. We will need a FU in sleep clinic for 10 weeks post-PAP set up, please arrange that as well. Thanks,   [name], MD, PhD [facility])

## 2016-09-19 NOTE — Telephone Encounter (Signed)
I spoke to patient and she is aware of results and recommendations. She is willing to start treatment. I have sent orders to [facility]. I have sent report to PCP. Patient was able to make f/u appt.

## 2016-09-19 NOTE — Procedures (Signed)
[facility] [place], [place], [place] [address]  NAME: Yvette White DOB: June 06, 1970 MEDICAL RECORD [id number]  DOS: 09/13/17 REFERRING PHYSICIAN: [name], PA  Study Performed:  HST/Out of Center Sleep Test  HISTORY: 46 year old woman with a history of vitamin D deficiency, history of syncope, reflux disease, hypothyroidism, depression, ADHD and morbid obesity, who reports snoring and excessive daytime somnolence.  Epworth sleepiness score is 6 out of 24, her fatigue score is 39 out of 63. BMI of 62.3.   STUDY RESULTS: Total Recording Time: 7h 47m Total Apnea/Hypopnea Index (AHI): 6.2/hour Average Oxygen Saturation: 94% Lowest Oxygen Saturation:86% Average Mean Heart Rate:   64 bpm  IMPRESSION:   Obstructive Sleep Apnea (OSA)    RECOMMENDATION: This home sleep test demonstrates overall borderline/mild obstructive sleep apnea with a total AHI of 6.2/hour and O2 nadir of 86%. Given the patient's medical history and sleep related complaints, treatment with positive airway pressure (in the form of autoPAP) should be considered. This will be discussed with the patient. Weight will likely help. A full night CPAP titration study for proper treatment settings and mask fitting can be considered. The patient should be cautioned not to drive, work at heights, or operate dangerous or heavy equipment when tired or sleepy. Review and reiteration of good sleep hygiene measures should be pursued with any patient. The patient and his referring provider will be notified of the test results. The patient will be seen in follow up in sleep clinic at [facility].  I certify that I have reviewed the raw data recording prior to the issuance of this report in accordance with the standards of Accreditation of the American Academy of Sleep medicine (AASM).  [name], MD, PhD [facility]) [place], ABPN (neurology and sleep)                    I certify that I have reviewed the raw data recording prior to the issuance of this report in accordance with the standards of Accreditation of the American Academy of Sleep medicine (AASM).      [name], MD, PhD Diplomat, American Board of Psychiatry and Neurology Diplomat, [place] Board of Sleep medicine

## 2016-09-19 NOTE — Telephone Encounter (Signed)
-----   Message from [name], MD sent at 09/19/2016  8:03 AM EDT ----- Patient referred by Ms. Beane, seen by me on 06/27/16, HST on 09/13/16.  Please call and notify the patient that the recent HST showed borderline/mild obstructive sleep apnea. It may be treating to see if she feels better after treatment. To that end I recommend treatment for this in the form of autoPAP, which means, that we don't have to bring her back for a sleep study with CPAP, but will let her try an autoPAP machine at home, through a DME company (of her choice, or as per insurance requirement). The DME representative will educate her on how to use the machine, how to put the mask on, etc. I have placed an order in the chart. Please send referral, talk to patient, send report to PCP and referring MD. We will need a FU in sleep clinic for 10 weeks post-PAP set up, please arrange that as well. Thanks,   [name], MD, PhD [facility])

## 2016-09-20 DIAGNOSIS — M25531 Pain in right wrist: Secondary | ICD-10-CM | POA: Diagnosis not present

## 2016-09-20 DIAGNOSIS — G5602 Carpal tunnel syndrome, left upper limb: Secondary | ICD-10-CM | POA: Diagnosis not present

## 2016-09-20 DIAGNOSIS — M25532 Pain in left wrist: Secondary | ICD-10-CM | POA: Diagnosis not present

## 2016-09-20 DIAGNOSIS — G5601 Carpal tunnel syndrome, right upper limb: Secondary | ICD-10-CM | POA: Diagnosis not present

## 2016-09-22 DIAGNOSIS — N393 Stress incontinence (female) (male): Secondary | ICD-10-CM | POA: Diagnosis not present

## 2016-09-26 DIAGNOSIS — M79675 Pain in left toe(s): Secondary | ICD-10-CM | POA: Diagnosis not present

## 2016-09-26 DIAGNOSIS — L03031 Cellulitis of right toe: Secondary | ICD-10-CM | POA: Diagnosis not present

## 2016-09-26 DIAGNOSIS — L03032 Cellulitis of left toe: Secondary | ICD-10-CM | POA: Diagnosis not present

## 2016-09-26 DIAGNOSIS — L02612 Cutaneous abscess of left foot: Secondary | ICD-10-CM | POA: Diagnosis not present

## 2016-10-17 DIAGNOSIS — L02611 Cutaneous abscess of right foot: Secondary | ICD-10-CM | POA: Diagnosis not present

## 2016-10-17 DIAGNOSIS — L03032 Cellulitis of left toe: Secondary | ICD-10-CM | POA: Diagnosis not present

## 2016-10-19 DIAGNOSIS — K219 Gastro-esophageal reflux disease without esophagitis: Secondary | ICD-10-CM | POA: Diagnosis not present

## 2016-10-19 DIAGNOSIS — Z6841 Body Mass Index (BMI) 40.0 and over, adult: Secondary | ICD-10-CM | POA: Diagnosis not present

## 2016-10-27 NOTE — Addendum Note (Signed)
Addendum  created 10/27/16 0841 by Effie Berkshire, MD   Sign clinical note

## 2016-10-31 DIAGNOSIS — L03031 Cellulitis of right toe: Secondary | ICD-10-CM | POA: Diagnosis not present

## 2016-11-15 DIAGNOSIS — L03031 Cellulitis of right toe: Secondary | ICD-10-CM | POA: Diagnosis not present

## 2016-11-21 DIAGNOSIS — Z9884 Bariatric surgery status: Secondary | ICD-10-CM | POA: Diagnosis not present

## 2016-11-21 DIAGNOSIS — Z713 Dietary counseling and surveillance: Secondary | ICD-10-CM | POA: Diagnosis not present

## 2016-11-21 DIAGNOSIS — Z6841 Body Mass Index (BMI) 40.0 and over, adult: Secondary | ICD-10-CM | POA: Diagnosis not present

## 2016-11-21 DIAGNOSIS — Z7189 Other specified counseling: Secondary | ICD-10-CM | POA: Diagnosis not present

## 2016-12-05 ENCOUNTER — Telehealth: Payer: Self-pay

## 2016-12-05 NOTE — Telephone Encounter (Signed)
Surgical clearance request received from [facility] for patient to undergo bariatric surgery.  To Dr. Radford Pax.    [facility] Phone: [phone] Fax: [phone]

## 2016-12-05 NOTE — Telephone Encounter (Signed)
Clearance forwarded [facility] at fax # [phone].

## 2016-12-05 NOTE — Telephone Encounter (Signed)
Nuclear stress test and echo were fine.  Patient is low risk from a cardiac standpoint for surgery

## 2016-12-11 ENCOUNTER — Ambulatory Visit: Payer: Self-pay | Admitting: Neurology

## 2016-12-18 DIAGNOSIS — G4733 Obstructive sleep apnea (adult) (pediatric): Secondary | ICD-10-CM | POA: Diagnosis not present

## 2016-12-21 DIAGNOSIS — K219 Gastro-esophageal reflux disease without esophagitis: Secondary | ICD-10-CM | POA: Diagnosis not present

## 2016-12-21 DIAGNOSIS — D508 Other iron deficiency anemias: Secondary | ICD-10-CM | POA: Diagnosis not present

## 2016-12-21 DIAGNOSIS — R1314 Dysphagia, pharyngoesophageal phase: Secondary | ICD-10-CM | POA: Diagnosis not present

## 2016-12-21 DIAGNOSIS — R946 Abnormal results of thyroid function studies: Secondary | ICD-10-CM | POA: Diagnosis not present

## 2016-12-21 DIAGNOSIS — Z6841 Body Mass Index (BMI) 40.0 and over, adult: Secondary | ICD-10-CM | POA: Diagnosis not present

## 2016-12-21 DIAGNOSIS — R739 Hyperglycemia, unspecified: Secondary | ICD-10-CM | POA: Diagnosis not present

## 2016-12-21 DIAGNOSIS — E559 Vitamin D deficiency, unspecified: Secondary | ICD-10-CM | POA: Diagnosis not present

## 2016-12-29 DIAGNOSIS — Z6841 Body Mass Index (BMI) 40.0 and over, adult: Secondary | ICD-10-CM | POA: Diagnosis not present

## 2016-12-29 DIAGNOSIS — M1711 Unilateral primary osteoarthritis, right knee: Secondary | ICD-10-CM | POA: Diagnosis not present

## 2016-12-29 DIAGNOSIS — M25561 Pain in right knee: Secondary | ICD-10-CM | POA: Diagnosis not present

## 2017-01-05 DIAGNOSIS — F411 Generalized anxiety disorder: Secondary | ICD-10-CM | POA: Diagnosis not present

## 2017-01-05 DIAGNOSIS — F3189 Other bipolar disorder: Secondary | ICD-10-CM | POA: Diagnosis not present

## 2017-01-09 ENCOUNTER — Encounter (INDEPENDENT_AMBULATORY_CARE_PROVIDER_SITE_OTHER): Payer: BLUE CROSS/BLUE SHIELD

## 2017-01-18 ENCOUNTER — Encounter: Payer: BLUE CROSS/BLUE SHIELD | Attending: Surgery | Admitting: Skilled Nursing Facility1

## 2017-01-18 ENCOUNTER — Encounter: Payer: Self-pay | Admitting: Skilled Nursing Facility1

## 2017-01-18 DIAGNOSIS — Z713 Dietary counseling and surveillance: Secondary | ICD-10-CM | POA: Insufficient documentation

## 2017-01-18 DIAGNOSIS — Z6841 Body Mass Index (BMI) 40.0 and over, adult: Secondary | ICD-10-CM | POA: Diagnosis not present

## 2017-01-18 DIAGNOSIS — G4733 Obstructive sleep apnea (adult) (pediatric): Secondary | ICD-10-CM | POA: Diagnosis not present

## 2017-01-18 NOTE — Progress Notes (Signed)
Pre-Op Assessment Visit:  Pre-Operative Sleeve Gastrectomy Surgery  Medical Nutrition Therapy:  Appt start time: 9:27 End time:  10:27  Patient was seen on 01/18/2017 for Pre-Operative Nutrition Assessment. Assessment and letter of approval faxed to [facility] Bariatric Surgery Program coordinator on 01/18/2017.   Pt states she has a fried that knows Dr. Hassell Done and also has done the whole process at [facility] already but our program is very different from theirs.   Pt admits she once thought it would be a magic tool, started at [place]  Pt expectation of surgery: to use this tool and to feel better  Pt expectation of Dietitian: Knowledge and support   Start weight at [facility]: 381.9 BMI: 61.64  24 hr Dietary Recall: First Meal: breakfast sandwich out Snack: trailmix or peanutbutter stuffed pretazels  Second Meal: skipped Snack: cheeseits Third Meal: eating out  Snack:  Beverages: starbucks coffee full of sugar, water, soda  Encouraged to engage in 150 minutes of moderate physical activity including cardiovascular and weight baring weekly  Handouts given during visit include:  . Pre-Op Goals . Bariatric Surgery Protein Shakes During the appointment today the following Pre-Op Goals were reviewed with the patient: . Maintain or lose weight as instructed by your surgeon . Make healthy food choices . Begin to limit portion sizes . Limited concentrated sugars and fried foods . Keep fat/sugar in the single digits per serving on             food labels . Practice CHEWING your food  (aim for 30 chews per bite or until applesauce consistency) . Practice not drinking 15 minutes before, during, and 30 minutes after each meal/snack . Avoid all carbonated beverages  . Avoid/limit caffeinated beverages  . Avoid all sugar-sweetened beverages . Consume 3 meals per day; eat every 3-5 hours . Make a list of non-food related activities . Aim for 64-100 ounces of FLUID daily   . Aim for at least 60-80 grams of PROTEIN daily . Look for a liquid protein source that contain ?15 g protein and ?5 g carbohydrate  (ex: shakes, drinks, shots)  -Follow diet recommendations listed below   Energy and Macronutrient Recomendations: Calories: 1600 Carbohydrate: 180 Protein: 120 Fat: 44  Demonstrated degree of understanding via:  Teach Back  Teaching Method Utilized:  Visual Auditory Hands on  Barriers to learning/adherence to lifestyle change: none identified  Patient to call the Nutrition and Diabetes Education Services to enroll in Pre-Op and Post-Op Nutrition Education when surgery date is scheduled.

## 2017-01-19 DIAGNOSIS — M25561 Pain in right knee: Secondary | ICD-10-CM | POA: Diagnosis not present

## 2017-01-19 DIAGNOSIS — M1711 Unilateral primary osteoarthritis, right knee: Secondary | ICD-10-CM | POA: Diagnosis not present

## 2017-01-24 ENCOUNTER — Ambulatory Visit (INDEPENDENT_AMBULATORY_CARE_PROVIDER_SITE_OTHER): Payer: BLUE CROSS/BLUE SHIELD | Admitting: Family Medicine

## 2017-01-24 ENCOUNTER — Encounter (INDEPENDENT_AMBULATORY_CARE_PROVIDER_SITE_OTHER): Payer: Self-pay | Admitting: Family Medicine

## 2017-01-24 VITALS — BP 120/77 | HR 60 | Temp 97.8°F | Resp 14 | Ht 66.0 in | Wt 383.0 lb

## 2017-01-24 DIAGNOSIS — Z1389 Encounter for screening for other disorder: Secondary | ICD-10-CM | POA: Diagnosis not present

## 2017-01-24 DIAGNOSIS — F319 Bipolar disorder, unspecified: Secondary | ICD-10-CM

## 2017-01-24 DIAGNOSIS — E669 Obesity, unspecified: Secondary | ICD-10-CM

## 2017-01-24 DIAGNOSIS — R5383 Other fatigue: Secondary | ICD-10-CM | POA: Diagnosis not present

## 2017-01-24 DIAGNOSIS — Z1331 Encounter for screening for depression: Secondary | ICD-10-CM

## 2017-01-24 DIAGNOSIS — R0602 Shortness of breath: Secondary | ICD-10-CM

## 2017-01-24 DIAGNOSIS — Z6841 Body Mass Index (BMI) 40.0 and over, adult: Secondary | ICD-10-CM | POA: Diagnosis not present

## 2017-01-24 DIAGNOSIS — Z0289 Encounter for other administrative examinations: Secondary | ICD-10-CM

## 2017-01-24 DIAGNOSIS — IMO0001 Reserved for inherently not codable concepts without codable children: Secondary | ICD-10-CM | POA: Insufficient documentation

## 2017-01-24 NOTE — Progress Notes (Signed)
.  Office: [phone]  /  Fax: [phone]   HPI:   Chief Complaint: OBESITY  Yvette White (MR# [id number]) is a 46 y.o. female who presents on 01/24/2017 for obesity evaluation and treatment. Current BMI is Body mass index is 61.82 kg/m.. Yvette White has struggled with obesity for years and has been unsuccessful in either losing weight or maintaining long term weight loss. Yvette White attended our information session and states she is currently in the action stage of change and ready to dedicate time achieving and maintaining a healthier weight.  Yvette White is having weight loss surgery (sleeve or bypass) in 3 weeks and feels she needs more support post-op due to her emotional eating issues. Yvette White states her family eats meals together she thinks her family will eat healthier with  her her desired weight loss is 205 lbs she has been heavy most of  her life she started gaining weight in her 20's her heaviest weight ever was 385 lbs. she is a picky eater and doesn't like to eat healthier foods  she has significant food cravings issues  she snacks frequently in the evenings she skips meals frequently she is frequently drinking liquids with calories she frequently makes poor food choices she has problems with excessive hunger  she frequently eats larger portions than normal  she has binge eating behaviors she struggles with emotional eating    Fatigue Yvette White feels her energy is lower than it should be. This has worsened with weight gain and has not worsened recently. Yvette White admits to daytime somnolence and admits to waking up still tired. Patient is at risk for obstructive sleep apnea. Patent has a history of symptoms of daytime fatigue, morning fatigue and morning headache. Patient generally gets 7 or 8 hours of sleep per night, and states they generally have restful sleep. Snoring is not present with CPAP. Apneic episodes are not present. Epworth Sleepiness Score is 2  Dyspnea on exertion Yvette White notes  increasing shortness of breath with exercising and seems to be worsening over time with weight gain. She notes getting out of breath sooner with activity than she used to. This has not gotten worse recently. [name] denies orthopnea.  Bipolar Disorder June has a diagnosis of Bipolar Disorder unknown type and is being followed by Psych. She is currently taking Lamictal. She shows no sign of suicidal or homicidal ideations.   Depression screen PHQ 2/9 01/24/2017 01/18/2017  Decreased Interest 2 0  Down, Depressed, Hopeless 3 0  PHQ - 2 Score 5 0  Altered sleeping 3 -  Tired, decreased energy 3 -  Change in appetite 2 -  Feeling bad or failure about yourself  2 -  Trouble concentrating 1 -  Moving slowly or fidgety/restless 3 -  Suicidal thoughts 1 -  PHQ-9 Score 20 -  Difficult doing work/chores Very difficult -        Depression Screen Yvette White's Food and Mood (modified PHQ-9) score was  Depression screen PHQ 2/9 01/24/2017  Decreased Interest 2  Down, Depressed, Hopeless 3  PHQ - 2 Score 5  Altered sleeping 3  Tired, decreased energy 3  Change in appetite 2  Feeling bad or failure about yourself  2  Trouble concentrating 1  Moving slowly or fidgety/restless 3  Suicidal thoughts 1  PHQ-9 Score 20  Difficult doing work/chores Very difficult    ALLERGIES: Allergies  Allergen Reactions  . Theophylline     Other reaction(s): Other (See Comments) Hyperactive  "Slo phylinne"  . Penicillins Rash      Has patient had a PCN reaction causing immediate rash, facial/tongue/throat swelling, SOB or lightheadedness with hypotension:unsure Has patient had a PCN reaction causing severe rash involving mucus membranes or skin necrosis:unsure Has patient had a PCN reaction that required hospitalization:No Has patient had a PCN reaction occurring within the last 10 years: No If all of the above answers are "NO", then may proceed with Cephalosporin use.   . Pseudoephedrine Rash  .  Sulfamethoxazole-Trimethoprim Rash    MEDICATIONS: Current Outpatient Prescriptions on File Prior to Visit  Medication Sig Dispense Refill  . acetaminophen (TYLENOL) 500 MG tablet Take 1,000 mg by mouth every 6 (six) hours as needed (for pain.).    Marland Kitchen Calcium Carbonate Antacid (ANTACID PO) Take 2 tablets by mouth as needed (for breakthrough acid reflux.).     Marland Kitchen clonazePAM (KLONOPIN) 2 MG tablet Take 2 mg by mouth daily as needed (for severe anxiety).     Marland Kitchen lamoTRIgine (LAMICTAL) 200 MG tablet Take 200 mg by mouth every evening.     . loratadine (CLARITIN) 10 MG tablet Take 10 mg by mouth every evening.    . meloxicam (MOBIC) 15 MG tablet Take 15 mg by mouth daily.    Marland Kitchen omeprazole (PRILOSEC) 40 MG capsule Take 40 mg by mouth daily.    . Vitamin D, Ergocalciferol, (DRISDOL) 50000 units CAPS capsule Take 50,000 Units by mouth every 7 (seven) days.   5   Current Facility-Administered Medications on File Prior to Visit  Medication Dose Route Frequency Provider Last Rate Last Dose  . regadenoson (LEXISCAN) injection SOLN 0.4 mg  0.4 mg Intravenous Once Dorothy Spark, MD        PAST MEDICAL HISTORY: Past Medical History:  Diagnosis Date  . ADD (attention deficit disorder)   . Anemia   . Anxiety   . Arthritis   . Back pain   . Depression   . Disorder of thyroid   . DOE (dyspnea on exertion) 07/13/2016  . GERD (gastroesophageal reflux disease)   . Headache    Migraines  . Heart murmur   . Hyperthyroidism   . Joint pain   . Menstrual disorder    uterine fibriods, polyps,cysts  . Morbid obesity ([place])   . Neurocardiogenic syncope   . Palpitations   . Pre-diabetes   . Sleep apnea   . Sleep disorder    being checked for sleep spnea   . Swallowing difficulty   . Swelling    feet and legs  . Vitamin D deficiency     PAST SURGICAL HISTORY: Past Surgical History:  Procedure Laterality Date  . DILATATION & CURETTAGE/HYSTEROSCOPY WITH MYOSURE N/A 06/08/2016   Procedure:  DILATATION & CURETTAGE/HYSTEROSCOPY;  Surgeon: Louretta Shorten, MD;  Location: [facility];  Service: Gynecology;  Laterality: N/A;  . TILT TABLE STUDY    . URETHRAL DILATION     age 89  . WISDOM TOOTH EXTRACTION      SOCIAL HISTORY: Social History  Substance Use Topics  . Smoking status: Former Smoker    Packs/day: 0.25    Years: 20.00    Types: Cigarettes    Quit date: 09/30/2014  . Smokeless tobacco: Never Used  . Alcohol use Yes     Comment: occ    FAMILY HISTORY: Family History  Problem Relation Age of Onset  . Diabetes Mother   . Heart disease Mother   . Uterine cancer Mother   . Hypertension Mother   . Hyperlipidemia Mother   . Kidney disease Mother   .  Thyroid disease Mother   . Cancer Mother   . Depression Mother   . Sleep apnea Mother   . Obesity Mother   . Heart attack Father 41  . Diabetes Father   . Heart disease Father   . Hypertension Father   . Hyperlipidemia Father   . Obesity Father   . Atrial fibrillation Maternal Grandmother   . Parkinson's disease Maternal Grandmother   . Colon cancer Maternal Grandfather 32    ROS: Review of Systems  Constitutional: Positive for malaise/fatigue.  HENT: Positive for congestion (nasal stuffiness), ear pain and sinus pain.        Difficult or Painful Swallowing  Eyes:       Wear Glasses or Contacts  Respiratory: Positive for shortness of breath (on exertion) and wheezing.        Painful or Difficulty Breathing  Cardiovascular: Negative for orthopnea.       Difficulty Breathing while Lying Down Calf/Leg Pain with Walking Leg Cramping  Gastrointestinal: Positive for heartburn.       Swallowing Difficulty  Musculoskeletal: Positive for back pain, joint pain (muscle or joint pain) and neck pain.       Neck Stiffness Muscle Stiffness   Neurological: Positive for headaches.  Psychiatric/Behavioral: Positive for depression. The patient is nervous/anxious.        Stress    PHYSICAL EXAM: Blood pressure 120/77, pulse  60, temperature 97.8 F (36.6 C), temperature source Oral, resp. rate 14, height 5' 6" (1.676 m), weight (!) 383 lb (173.7 kg), SpO2 98 %. Body mass index is 61.82 kg/m. Physical Exam  Constitutional: She is oriented to person, place, and time. She appears well-developed and well-nourished.  Cardiovascular: Normal rate.   Pulmonary/Chest: Effort normal.  Musculoskeletal: She exhibits edema (1+ edema bilateral lower extremities).  Neurological: She is oriented to person, place, and time.  Skin: Skin is warm and dry.  Vitals reviewed.   RECENT LABS AND TESTS: BMET    Component Value Date/Time   NA 136 03/09/2007 0146   K 3.5 03/09/2007 0146   CL 102 03/09/2007 0146   CO2 26 03/09/2007 0146   GLUCOSE 90 03/09/2007 0146   BUN 9 03/09/2007 0146   CREATININE 0.61 03/09/2007 0146   CALCIUM 8.5 03/09/2007 0146   GFRNONAA >60 03/09/2007 0146   GFRAA  03/09/2007 0146    >60        The eGFR has been calculated using the MDRD equation. This calculation has not been validated in all clinical   No results found for: HGBA1C No results found for: INSULIN CBC    Component Value Date/Time   WBC 9.6 06/01/2016 1350   RBC 4.41 06/01/2016 1350   HGB 11.7 (L) 06/01/2016 1350   HCT 35.3 (L) 06/01/2016 1350   PLT 271 06/01/2016 1350   MCV 80.0 06/01/2016 1350   MCH 26.5 06/01/2016 1350   MCHC 33.1 06/01/2016 1350   RDW 13.1 06/01/2016 1350   LYMPHSABS 1.7 03/09/2007 0146   MONOABS 0.6 03/09/2007 0146   EOSABS 0.2 03/09/2007 0146   BASOSABS 0.0 03/09/2007 0146   Iron/TIBC/Ferritin/ %Sat No results found for: IRON, TIBC, FERRITIN, IRONPCTSAT Lipid Panel  No results found for: CHOL, TRIG, HDL, CHOLHDL, VLDL, LDLCALC, LDLDIRECT Hepatic Function Panel     Component Value Date/Time   PROT 6.8 03/09/2007 0146   ALBUMIN 3.7 03/09/2007 0146   AST 25 03/09/2007 0146   ALT 30 03/09/2007 0146   ALKPHOS 100 03/09/2007 0146   BILITOT 0.5 03/09/2007  0146     ECG  shows NSR with a rate  of 62 BPM INDIRECT CALORIMETER done today shows a VO2 of 245 and a REE of 1704. Her calculated basal metabolic rate is 2348 thus her basal metabolic rate is worse than expected.    ASSESSMENT AND PLAN: Other fatigue - Plan: EKG 12-Lead  Shortness of breath on exertion  Bipolar 1 disorder (HCC)  Depression screening  Class 3 obesity with serious comorbidity and body mass index (BMI) of 60.0 to 69.9 in adult, unspecified obesity type (HCC)  PLAN:  Fatigue Eulala was informed that her fatigue may be related to obesity, depression or many other causes. Labs will be ordered, and in the meanwhile Evva has agreed to work on diet, exercise and weight loss to help with fatigue. Proper sleep hygiene was discussed including the need for 7-8 hours of quality sleep each night. A sleep study was not ordered based on symptoms and Epworth score.  Dyspnea on exertion Anamae's shortness of breath appears to be obesity related and exercise induced. She has agreed to work on weight loss and gradually increase exercise to treat her exercise induced shortness of breath. If Micca follows our instructions and loses weight without improvement of her shortness of breath, we will plan to refer to pulmonology. We will monitor this condition regularly. Akanksha agrees to this plan.  Bipolar Disorder Toula was advised of risks of worsening mood after weight loss surgery and to not discontinue her Lamictal. We will follow up with her after surgery to monitor progress.  Depression Screen Shanise had a strongly positive depression screening. Depression is commonly associated with obesity and often results in emotional eating behaviors. We will monitor this closely and work on CBT to help improve the non-hunger eating patterns. Referral to Psychology may be required if no improvement is seen as she continues in our clinic.  Obesity Maryse is currently in the action stage of change and her goal is to continue with weight loss  efforts She has agreed to follow the Category 2 plan +100 calories and then pre-op diet to help shrink liver. We will follow up with Thena 6 weeks post op when she is able to start eating more foods. Jessi has been instructed to work up to a goal of 150 minutes of combined cardio and strengthening exercise per week for weight loss and overall health benefits. We discussed the following Behavioral Modification Strategies today: meal planning & cooking strategies, keeping healthy foods in the home, increasing lean protein intake, decreasing simple carbohydrates  and emotional eating strategies  Taylie has agreed to follow up with our clinic in 9 to 10 weeks. She was informed of the importance of frequent follow up visits to maximize her success with intensive lifestyle modifications for her multiple health conditions. She was informed we would discuss her lab results at her next visit unless there is a critical issue that needs to be addressed sooner. Deneen agreed to keep her next visit at the agreed upon time to discuss these results.  I, Joanne Murray, am acting as transcriptionist for Caren Beasley, MD  I have reviewed the above documentation for accuracy and completeness, and I agree with the above. -Caren Beasley, MD   OBESITY BEHAVIORAL INTERVENTION VISIT  Today's visit was # 1 out of 22.  Starting weight: 383 lbs Starting date: 01/24/17 Today's weight : 383 lbs Today's date: 01/24/2017 Total lbs lost to date: 0 (Patients must lose 7 lbs in the first 6 months   to continue with counseling)   ASK: We discussed the diagnosis of obesity with Sharlyne Cai today and Cayla agreed to give [place] permission to discuss obesity behavioral modification therapy today.  ASSESS: Tiyana has the diagnosis of obesity and her BMI today is 61.85 Damary is in the action stage of change   ADVISE: Marien was educated on the multiple health risks of obesity as well as the benefit of weight loss to improve her health.  She was advised of the need for long term treatment and the importance of lifestyle modifications.  AGREE: Multiple dietary modification options and treatment options were discussed and  Jenisha agreed to follow the Category 2 plan +100 calories and then pre-op diet to help shrink liver. We will follow up with Cecille Rubin 6 weeks post-op when she is able to start eating more foods We discussed the following Behavioral Modification Strategies today: meal planning & cooking strategies, keeping healthy foods in the home, increasing lean protein intake, decreasing simple carbohydrates and emotional eating strategies

## 2017-01-25 ENCOUNTER — Ambulatory Visit: Payer: Self-pay | Admitting: Registered"

## 2017-01-30 ENCOUNTER — Encounter: Payer: BLUE CROSS/BLUE SHIELD | Attending: Surgery | Admitting: Registered"

## 2017-01-30 DIAGNOSIS — Z713 Dietary counseling and surveillance: Secondary | ICD-10-CM | POA: Insufficient documentation

## 2017-01-30 DIAGNOSIS — Z6841 Body Mass Index (BMI) 40.0 and over, adult: Secondary | ICD-10-CM | POA: Diagnosis not present

## 2017-01-30 DIAGNOSIS — E669 Obesity, unspecified: Secondary | ICD-10-CM

## 2017-01-30 NOTE — Progress Notes (Signed)
  Pre-Operative Nutrition Class:  Appt start time: 8:15   End time:  9:15  Patient was seen on 01/30/2017 for Pre-Operative Bariatric Surgery Education at [facility].   Surgery date: 02/20/2017 Surgery type: Sleeve gastrectomy [facility]: 381.9 Weight today: 385.1   Samples given per MNT protocol. Patient educated on appropriate usage: Bariatric Advantage Multivitamin Lot # [id number] Exp: 10/2017  Bariatric Advantage Calcium Citrate Lot # [id number] Exp: 02/21/2017  Bariatric Advantage Calcium Citrate Lot # [id number] Exp: 05/16/2017  Premier Protein Drink Lot # [id number] Exp: 04/24/2017   The following the learning objectives were met by the patient during this course:  Identify Pre-Op Dietary Goals and will begin 2 weeks pre-operatively  Identify appropriate sources of fluids and proteins   State protein recommendations and appropriate sources pre and post-operatively  Identify Post-Operative Dietary Goals and will follow for 2 weeks post-operatively  Identify appropriate multivitamin and calcium sources  Describe the need for physical activity post-operatively and will follow MD recommendations  State when to call healthcare provider regarding medication questions or post-operative complications  Handouts given during class include:  Pre-Op Bariatric Surgery Diet Handout  Protein Shake Handout  Post-Op Bariatric Surgery Nutrition Handout  BELT Program Information Flyer  Support Group Information Flyer  [facility] Bariatric Supplements Price List  Follow-Up Plan: Patient will follow-up at [facility] 2 weeks post operatively for diet advancement per MD.

## 2017-01-31 DIAGNOSIS — Z6841 Body Mass Index (BMI) 40.0 and over, adult: Secondary | ICD-10-CM | POA: Diagnosis not present

## 2017-02-06 NOTE — Progress Notes (Signed)
Please place orders in EPIC as patient is being scheduled for a pre-op appointment! Thank you! 

## 2017-02-14 NOTE — Patient Instructions (Addendum)
Yvette White  02/14/2017   Your procedure is scheduled on: 02-20-17   Report to [facility] Main  Entrance Take [name]  Elevators to 3rd floor to  [name] at 9:25 AM.   Call this number if you have problems the morning of surgery [phone]    Remember: ONLY 1 PERSON MAY GO WITH YOU TO SHORT STAY TO GET  READY MORNING OF Yvette White.  Do not eat food or drink liquids :After Midnight.     Take these medicines the morning of surgery with A SIP OF WATER: Omeprazole (Prilosec)                                You may not have any metal on your body including hair pins and              piercings  Do not wear jewelry, make-up, lotions, powders or perfumes, deodorant             Men may shave face and neck.   Do not bring valuables to the hospital. [name].  Contacts, dentures or bridgework may not be worn into surgery.  Leave suitcase in the car. After surgery it may be brought to your room.                 Please read over the following fact sheets you were given: _____________________________________________________________________             [facility] - Preparing for Surgery Before surgery, you can play an important role.  Because skin is not sterile, your skin needs to be as free of germs as possible.  You can reduce the number of germs on your skin by washing with CHG (chlorahexidine gluconate) soap before surgery.  CHG is an antiseptic cleaner which kills germs and bonds with the skin to continue killing germs even after washing. Please DO NOT use if you have an allergy to CHG or antibacterial soaps.  If your skin becomes reddened/irritated stop using the CHG and inform your nurse when you arrive at Short Stay. Do not shave (including legs and underarms) for at least 48 hours prior to the first CHG shower.  You may shave your face/neck. Please follow these instructions carefully:  1.  Shower  with CHG Soap the night before surgery and the  morning of Surgery.  2.  If you choose to wash your hair, wash your hair first as usual with your  normal  shampoo.  3.  After you shampoo, rinse your hair and body thoroughly to remove the  shampoo.                           4.  Use CHG as you would any other liquid soap.  You can apply chg directly  to the skin and wash                       Gently with a scrungie or clean washcloth.  5.  Apply the CHG Soap to your body ONLY FROM THE NECK DOWN.   Do not use on face/ open  Wound or open sores. Avoid contact with eyes, ears mouth and genitals (private parts).                       Wash face,  Genitals (private parts) with your normal soap.             6.  Wash thoroughly, paying special attention to the area where your surgery  will be performed.  7.  Thoroughly rinse your body with warm water from the neck down.  8.  DO NOT shower/wash with your normal soap after using and rinsing off  the CHG Soap.                9.  Pat yourself dry with a clean towel.            10.  Wear clean pajamas.            11.  Place clean sheets on your bed the night of your first shower and do not  sleep with pets. Day of Surgery : Do not apply any lotions/deodorants the morning of surgery.  Please wear clean clothes to the hospital/surgery center.  FAILURE TO FOLLOW THESE INSTRUCTIONS MAY RESULT IN THE CANCELLATION OF YOUR SURGERY PATIENT SIGNATURE_________________________________  NURSE SIGNATURE__________________________________  ________________________________________________________________________

## 2017-02-14 NOTE — Progress Notes (Signed)
01-24-17 (EPIC) EKG  08-31-16 (EPIC) Stress Test  08-02-16 (EPIC) ECHO

## 2017-02-16 ENCOUNTER — Encounter (HOSPITAL_COMMUNITY)
Admission: RE | Admit: 2017-02-16 | Discharge: 2017-02-16 | Disposition: A | Discharge status: Home / Self Care | Payer: BLUE CROSS/BLUE SHIELD | Source: Ambulatory Visit | Attending: Surgery | Admitting: Surgery

## 2017-02-16 ENCOUNTER — Encounter (HOSPITAL_COMMUNITY): Payer: Self-pay

## 2017-02-16 DIAGNOSIS — Z01818 Encounter for other preprocedural examination: Secondary | ICD-10-CM | POA: Diagnosis not present

## 2017-02-16 DIAGNOSIS — E119 Type 2 diabetes mellitus without complications: Secondary | ICD-10-CM | POA: Diagnosis not present

## 2017-02-16 LAB — BASIC METABOLIC PANEL
ANION GAP: 8 (ref 5–15)
BUN: 19 mg/dL (ref 6–20)
CHLORIDE: 103 mmol/L (ref 101–111)
CO2: 27 mmol/L (ref 22–32)
Calcium: 9.4 mg/dL (ref 8.9–10.3)
Creatinine, Ser: 0.99 mg/dL (ref 0.44–1.00)
GFR calc Af Amer: >60 mL/min (ref >60)
Glucose, Bld: 112 mg/dL — ABNORMAL HIGH (ref 65–99)
POTASSIUM: 5.4 mmol/L — AB (ref 3.5–5.1)
SODIUM: 138 mmol/L (ref 135–145)

## 2017-02-16 LAB — CBC
HEMATOCRIT: 38.4 % (ref 36.0–46.0)
HEMOGLOBIN: 12.7 g/dL (ref 12.0–15.0)
MCH: 26.4 pg (ref 26.0–34.0)
MCHC: 33.1 g/dL (ref 30.0–36.0)
MCV: 79.8 fL (ref 78.0–100.0)
Platelets: 258 10*3/uL (ref 150–400)
RBC: 4.81 MIL/uL (ref 3.87–5.11)
RDW: 13.9 % (ref 11.5–15.5)
WBC: 6.8 10*3/uL (ref 4.0–10.5)

## 2017-02-16 LAB — HEMOGLOBIN A1C
Hgb A1c MFr Bld: 5.6 % (ref 4.8–5.6)
MEAN PLASMA GLUCOSE: 114.02 mg/dL

## 2017-02-16 LAB — PREGNANCY, URINE: Preg Test, Ur: NEGATIVE

## 2017-02-18 DIAGNOSIS — G4733 Obstructive sleep apnea (adult) (pediatric): Secondary | ICD-10-CM | POA: Diagnosis not present

## 2017-02-19 ENCOUNTER — Ambulatory Visit: Payer: Self-pay | Admitting: Surgery

## 2017-02-19 NOTE — Progress Notes (Signed)
Called triage at [facility] to ask for orders for patient's surgery on 02/20/17.

## 2017-02-19 NOTE — H&P (Signed)
Yvette White 01/31/2017 9:25 AM Location: [place] Office Patient #: [id number] DOB: 1971/03/10 Single / Language: [name] / Race: White Female   History of Present Illness Yvette White B. Hassell Done MD; 01/31/2017 10:17 AM) Patient words: f/u discuss surgery.  The patient is a 46 year old female who presents for a bariatric surgery evaluation. I reviewed all of her paper work. Yvette White is waiting on her psych report. Yvette White is very informed about bad eating habits-eating when bored and eating out of habit. We discussed sleeve and Roux en Y gastric bypass. Her UGI in March did not show gER or a hiatal hernia. I think that if we stay away from the [place] of [place] that we will not create GER and will check her for an undiagnosied hiatal hernia.   I answered her questions and she will start her White carb diet today in anticipation of her surgery on Sept 25th.    Allergies Malachi Bonds, CMA; 01/31/2017 9:27 AM) Penicillin G Sodium *PENICILLINS*  Bactrim *ANTI-INFECTIVE AGENTS - MISC.*  Pseudoephedrine HCl *NASAL AGENTS - SYSTEMIC AND TOPICAL*   Medication History (Yvette White, CMA; 01/31/2017 9:34 AM) Vitamin D (Ergocalciferol) (50000UNIT Capsule, Oral) Active. Loratadine (10MG  Tablet, Oral) Active. Acetaminophen (500MG  Tablet, Oral) Active. Antacid Calcium (500MG  Tablet Chewable, Oral) Active. ClonazePAM (0.5MG  Tablet, Oral) Active. Meloxicam (15MG  Tablet, Oral) Active. Omeprazole (40MG  Capsule DR, Oral) Active. Ferrous Sulfate (325 (65 Fe)MG Tablet, Oral) Active. LamoTRIgine (200MG  Tablet, Oral) Active. Amphetamine-Dextroamphetamine (20MG  Tablet, Oral) Active. Hydrocodone-Acetaminophen (5-325MG  Tablet, Oral) Active. Medications Reconciled  Vitals (Yvette White CMA; 01/31/2017 9:35 AM) 01/31/2017 9:26 AM Weight: 386.8 lb Height: 66in Body Surface Area: 2.65 m Body Mass Index: 62.43 kg/m  Pulse: 66 (Regular)  BP: 130/80 (Sitting, Left Arm,  Standard)    12/28/2016 9:34 AM Weight: 382.6 lb Height: 66in Body Surface Area: 2.64 m Body Mass Index: 61.75 kg/m  Temp.: 98.34F(Oral)  Pulse: 72 (Regular)  BP: 132/78 (Sitting, Left Arm, Standard)       Physical Exam (Yvette White B. Hassell Done MD; 01/31/2017 10:18 AM) General Note: Obese WF NAD HEENT - glasses Neck supple Chest clear Heart SR without murmurs or gallops Abdomen obese without scars Ext FROM without hx of DVT     Assessment & Plan Yvette White B. Hassell Done MD; 01/31/2017 10:20 AM) MORBID OBESITY WITH BMI OF 60.0-69.9, ADULT (E66.01) She has been worked up and informed consent obtained regarding sleeve gastrectomy.  Will proceed.  UGI showed no HH or reflux.    Matt B. Hassell Done, MD, FACS

## 2017-02-20 ENCOUNTER — Encounter (HOSPITAL_COMMUNITY)
Admission: RE | Disposition: A | Discharge status: Home / Self Care | Payer: Self-pay | Source: Ambulatory Visit | Attending: Surgery

## 2017-02-20 ENCOUNTER — Encounter (HOSPITAL_COMMUNITY): Payer: Self-pay | Admitting: Emergency Medicine

## 2017-02-20 ENCOUNTER — Inpatient Hospital Stay (HOSPITAL_COMMUNITY): Payer: BLUE CROSS/BLUE SHIELD

## 2017-02-20 ENCOUNTER — Inpatient Hospital Stay (HOSPITAL_COMMUNITY)
Admission: RE | Admit: 2017-02-20 | Discharge: 2017-02-22 | DRG: 621 | Disposition: A | Discharge status: Home / Self Care | Payer: BLUE CROSS/BLUE SHIELD | Source: Ambulatory Visit | Attending: Surgery | Admitting: Surgery

## 2017-02-20 DIAGNOSIS — G4730 Sleep apnea, unspecified: Secondary | ICD-10-CM | POA: Diagnosis present

## 2017-02-20 DIAGNOSIS — Z23 Encounter for immunization: Secondary | ICD-10-CM | POA: Diagnosis not present

## 2017-02-20 DIAGNOSIS — Z87891 Personal history of nicotine dependence: Secondary | ICD-10-CM | POA: Diagnosis not present

## 2017-02-20 DIAGNOSIS — F319 Bipolar disorder, unspecified: Secondary | ICD-10-CM | POA: Diagnosis not present

## 2017-02-20 DIAGNOSIS — Z6841 Body Mass Index (BMI) 40.0 and over, adult: Secondary | ICD-10-CM | POA: Diagnosis not present

## 2017-02-20 DIAGNOSIS — Z888 Allergy status to other drugs, medicaments and biological substances status: Secondary | ICD-10-CM | POA: Diagnosis not present

## 2017-02-20 DIAGNOSIS — Z880 Allergy status to penicillin: Secondary | ICD-10-CM

## 2017-02-20 DIAGNOSIS — K219 Gastro-esophageal reflux disease without esophagitis: Secondary | ICD-10-CM | POA: Diagnosis present

## 2017-02-20 DIAGNOSIS — E0590 Thyrotoxicosis, unspecified without thyrotoxic crisis or storm: Secondary | ICD-10-CM | POA: Diagnosis not present

## 2017-02-20 DIAGNOSIS — Z9884 Bariatric surgery status: Secondary | ICD-10-CM

## 2017-02-20 DIAGNOSIS — E119 Type 2 diabetes mellitus without complications: Secondary | ICD-10-CM | POA: Diagnosis not present

## 2017-02-20 HISTORY — PX: LAPAROSCOPIC GASTRIC SLEEVE RESECTION: SHX5895

## 2017-02-20 LAB — CBC
HCT: 37.6 % (ref 36.0–46.0)
Hemoglobin: 12.6 g/dL (ref 12.0–15.0)
MCH: 26.8 pg (ref 26.0–34.0)
MCHC: 33.5 g/dL (ref 30.0–36.0)
MCV: 80 fL (ref 78.0–100.0)
PLATELETS: 231 10*3/uL (ref 150–400)
RBC: 4.7 MIL/uL (ref 3.87–5.11)
RDW: 13.8 % (ref 11.5–15.5)
WBC: 10.8 10*3/uL — ABNORMAL HIGH (ref 4.0–10.5)

## 2017-02-20 LAB — CREATININE, SERUM: Creatinine, Ser: 0.99 mg/dL (ref 0.44–1.00)

## 2017-02-20 SURGERY — GASTRECTOMY, SLEEVE, LAPAROSCOPIC
Anesthesia: General

## 2017-02-20 MED ORDER — ACETAMINOPHEN 160 MG/5ML PO SOLN: 325.0000 mg | ORAL | Status: DC | PRN

## 2017-02-20 MED ORDER — FENTANYL CITRATE (PF) 100 MCG/2ML IJ SOLN
INTRAMUSCULAR | Status: DC | PRN
  Administered 2017-02-20: 100 ug via INTRAVENOUS
  Administered 2017-02-20 (×2): 50 ug via INTRAVENOUS

## 2017-02-20 MED ORDER — SODIUM CHLORIDE 0.9 % IJ SOLN
INTRAMUSCULAR | Status: DC | PRN
  Administered 2017-02-20: 20 mL

## 2017-02-20 MED ORDER — PREMIER PROTEIN SHAKE
2.0000 [oz_av] | ORAL | Status: DC
  Administered 2017-02-21: 2 [oz_av] via ORAL
  Administered 2017-02-21: 1 [oz_av] via ORAL
  Administered 2017-02-21 – 2017-02-22 (×3): 2 [oz_av] via ORAL

## 2017-02-20 MED ORDER — KETAMINE HCL-SODIUM CHLORIDE 100-0.9 MG/10ML-% IV SOSY
PREFILLED_SYRINGE | INTRAVENOUS | Status: AC
  Filled 2017-02-20: qty 10

## 2017-02-20 MED ORDER — BUPIVACAINE LIPOSOME 1.3 % IJ SUSP
INTRAMUSCULAR | Status: DC | PRN
  Administered 2017-02-20: 20 mL

## 2017-02-20 MED ORDER — OXYCODONE HCL 5 MG/5ML PO SOLN: 5.0000 mg | ORAL | Status: DC | PRN

## 2017-02-20 MED ORDER — BUPIVACAINE LIPOSOME 1.3 % IJ SUSP
20.0000 mL | Freq: Once | INTRAMUSCULAR | Status: DC
  Filled 2017-02-20: qty 20

## 2017-02-20 MED ORDER — HEPARIN SODIUM (PORCINE) 5000 UNIT/ML IJ SOLN
5000.0000 [IU] | INTRAMUSCULAR | Status: AC
  Administered 2017-02-20: 5000 [IU] via SUBCUTANEOUS
  Filled 2017-02-20: qty 1

## 2017-02-20 MED ORDER — ROCURONIUM BROMIDE 10 MG/ML (PF) SYRINGE
PREFILLED_SYRINGE | INTRAVENOUS | Status: DC | PRN
  Administered 2017-02-20: 50 mg via INTRAVENOUS

## 2017-02-20 MED ORDER — LACTATED RINGERS IR SOLN
Status: DC | PRN
  Administered 2017-02-20: 1000 mL

## 2017-02-20 MED ORDER — FENTANYL CITRATE (PF) 100 MCG/2ML IJ SOLN
25.0000 ug | INTRAMUSCULAR | Status: DC | PRN
  Administered 2017-02-20 (×2): 50 ug via INTRAVENOUS

## 2017-02-20 MED ORDER — FENTANYL CITRATE (PF) 100 MCG/2ML IJ SOLN
INTRAMUSCULAR | Status: AC
  Filled 2017-02-20: qty 4

## 2017-02-20 MED ORDER — ALBUTEROL SULFATE (2.5 MG/3ML) 0.083% IN NEBU
2.5000 mg | INHALATION_SOLUTION | Freq: Once | RESPIRATORY_TRACT | Status: AC
  Administered 2017-02-20: 3 mL via RESPIRATORY_TRACT

## 2017-02-20 MED ORDER — DEXAMETHASONE SODIUM PHOSPHATE 10 MG/ML IJ SOLN
INTRAMUSCULAR | Status: DC | PRN
  Administered 2017-02-20: 10 mg via INTRAVENOUS

## 2017-02-20 MED ORDER — PROMETHAZINE HCL 25 MG/ML IJ SOLN
INTRAMUSCULAR | Status: AC
  Filled 2017-02-20: qty 1

## 2017-02-20 MED ORDER — HEPARIN SODIUM (PORCINE) 5000 UNIT/ML IJ SOLN
5000.0000 [IU] | Freq: Three times a day (TID) | INTRAMUSCULAR | Status: DC
  Administered 2017-02-20 – 2017-02-22 (×6): 5000 [IU] via SUBCUTANEOUS
  Filled 2017-02-20 (×6): qty 1

## 2017-02-20 MED ORDER — DEXAMETHASONE SODIUM PHOSPHATE 4 MG/ML IJ SOLN: 4.0000 mg | INTRAMUSCULAR | Status: DC

## 2017-02-20 MED ORDER — ALBUTEROL SULFATE (2.5 MG/3ML) 0.083% IN NEBU
INHALATION_SOLUTION | RESPIRATORY_TRACT | Status: AC
  Filled 2017-02-20: qty 3

## 2017-02-20 MED ORDER — APREPITANT 40 MG PO CAPS
40.0000 mg | ORAL_CAPSULE | ORAL | Status: AC
  Administered 2017-02-20: 40 mg via ORAL
  Filled 2017-02-20: qty 1

## 2017-02-20 MED ORDER — SODIUM CHLORIDE 0.9 % IJ SOLN
INTRAMUSCULAR | Status: AC
  Filled 2017-02-20: qty 20

## 2017-02-20 MED ORDER — ONDANSETRON HCL 4 MG/2ML IJ SOLN
4.0000 mg | INTRAMUSCULAR | Status: DC | PRN
  Administered 2017-02-20 – 2017-02-22 (×7): 4 mg via INTRAVENOUS
  Filled 2017-02-20 (×6): qty 2

## 2017-02-20 MED ORDER — CHLORHEXIDINE GLUCONATE CLOTH 2 % EX PADS: 6.0000 | MEDICATED_PAD | Freq: Once | CUTANEOUS | Status: DC

## 2017-02-20 MED ORDER — ACETAMINOPHEN 500 MG PO TABS
1000.0000 mg | ORAL_TABLET | ORAL | Status: AC
  Administered 2017-02-20: 1000 mg via ORAL
  Filled 2017-02-20: qty 2

## 2017-02-20 MED ORDER — GABAPENTIN 300 MG PO CAPS
300.0000 mg | ORAL_CAPSULE | ORAL | Status: AC
  Administered 2017-02-20: 300 mg via ORAL
  Filled 2017-02-20: qty 1

## 2017-02-20 MED ORDER — MORPHINE SULFATE (PF) 2 MG/ML IV SOLN: 1.0000 mg | INTRAVENOUS | Status: DC | PRN

## 2017-02-20 MED ORDER — PROPOFOL 10 MG/ML IV BOLUS
INTRAVENOUS | Status: DC | PRN
  Administered 2017-02-20: 200 mg via INTRAVENOUS

## 2017-02-20 MED ORDER — MIDAZOLAM HCL 5 MG/5ML IJ SOLN
INTRAMUSCULAR | Status: DC | PRN
  Administered 2017-02-20: 2 mg via INTRAVENOUS

## 2017-02-20 MED ORDER — HYDRALAZINE HCL 20 MG/ML IJ SOLN: 10.0000 mg | INTRAMUSCULAR | Status: DC | PRN

## 2017-02-20 MED ORDER — INFLUENZA VAC SPLIT QUAD 0.5 ML IM SUSY
0.5000 mL | PREFILLED_SYRINGE | INTRAMUSCULAR | Status: AC
  Administered 2017-02-21: 0.5 mL via INTRAMUSCULAR
  Filled 2017-02-20: qty 0.5

## 2017-02-20 MED ORDER — PROPOFOL 10 MG/ML IV BOLUS
INTRAVENOUS | Status: AC
  Filled 2017-02-20: qty 20

## 2017-02-20 MED ORDER — CEFOTETAN DISODIUM-DEXTROSE 2-2.08 GM-% IV SOLR
2.0000 g | INTRAVENOUS | Status: AC
  Administered 2017-02-20: 2 g via INTRAVENOUS
  Filled 2017-02-20: qty 50

## 2017-02-20 MED ORDER — GLYCOPYRROLATE 0.2 MG/ML IJ SOLN
INTRAMUSCULAR | Status: DC | PRN
  Administered 2017-02-20: 0.2 mg via INTRAVENOUS

## 2017-02-20 MED ORDER — EPHEDRINE SULFATE-NACL 50-0.9 MG/10ML-% IV SOSY
PREFILLED_SYRINGE | INTRAVENOUS | Status: DC | PRN
  Administered 2017-02-20: 5 mg via INTRAVENOUS

## 2017-02-20 MED ORDER — FENTANYL CITRATE (PF) 250 MCG/5ML IJ SOLN
INTRAMUSCULAR | Status: AC
  Filled 2017-02-20: qty 5

## 2017-02-20 MED ORDER — ROCURONIUM BROMIDE 50 MG/5ML IV SOSY
PREFILLED_SYRINGE | INTRAVENOUS | Status: AC
  Filled 2017-02-20: qty 5

## 2017-02-20 MED ORDER — ONDANSETRON HCL 4 MG/2ML IJ SOLN
INTRAMUSCULAR | Status: DC | PRN
  Administered 2017-02-20: 4 mg via INTRAVENOUS

## 2017-02-20 MED ORDER — PROMETHAZINE HCL 25 MG/ML IJ SOLN
6.2500 mg | INTRAMUSCULAR | Status: AC | PRN
  Administered 2017-02-20: 12.5 mg via INTRAVENOUS
  Administered 2017-02-20: 6.25 mg via INTRAVENOUS

## 2017-02-20 MED ORDER — LIDOCAINE 2% (20 MG/ML) 5 ML SYRINGE
INTRAMUSCULAR | Status: AC
  Filled 2017-02-20: qty 5

## 2017-02-20 MED ORDER — KCL IN DEXTROSE-NACL 20-5-0.45 MEQ/L-%-% IV SOLN
INTRAVENOUS | Status: DC
  Administered 2017-02-20 – 2017-02-22 (×5): via INTRAVENOUS
  Filled 2017-02-20 (×7): qty 1000

## 2017-02-20 MED ORDER — SCOPOLAMINE 1 MG/3DAYS TD PT72
1.0000 | MEDICATED_PATCH | TRANSDERMAL | Status: DC
  Administered 2017-02-20: 1.5 mg via TRANSDERMAL
  Filled 2017-02-20: qty 1

## 2017-02-20 MED ORDER — PANTOPRAZOLE SODIUM 40 MG IV SOLR
40.0000 mg | Freq: Every day | INTRAVENOUS | Status: DC
  Administered 2017-02-20 – 2017-02-21 (×2): 40 mg via INTRAVENOUS
  Filled 2017-02-20 (×2): qty 40

## 2017-02-20 MED ORDER — MIDAZOLAM HCL 2 MG/2ML IJ SOLN
INTRAMUSCULAR | Status: AC
  Filled 2017-02-20: qty 2

## 2017-02-20 MED ORDER — EPHEDRINE 5 MG/ML INJ
INTRAVENOUS | Status: AC
  Filled 2017-02-20: qty 10

## 2017-02-20 MED ORDER — ACETAMINOPHEN 325 MG PO TABS: 650.0000 mg | ORAL_TABLET | ORAL | Status: DC | PRN

## 2017-02-20 MED ORDER — FENTANYL CITRATE (PF) 100 MCG/2ML IJ SOLN: 25.0000 ug | INTRAMUSCULAR | Status: DC | PRN

## 2017-02-20 MED ORDER — 0.9 % SODIUM CHLORIDE (POUR BTL) OPTIME
TOPICAL | Status: DC | PRN
  Administered 2017-02-20: 1000 mL

## 2017-02-20 MED ORDER — PROMETHAZINE HCL 25 MG/ML IJ SOLN: 6.2500 mg | INTRAMUSCULAR | Status: DC | PRN

## 2017-02-20 MED ORDER — SUGAMMADEX SODIUM 500 MG/5ML IV SOLN
INTRAVENOUS | Status: DC | PRN
  Administered 2017-02-20: 350 mg via INTRAVENOUS

## 2017-02-20 MED ORDER — KETAMINE HCL 10 MG/ML IJ SOLN
INTRAMUSCULAR | Status: DC | PRN
  Administered 2017-02-20: 80 mg via INTRAVENOUS
  Administered 2017-02-20: 20 mg via INTRAVENOUS

## 2017-02-20 MED ORDER — LACTATED RINGERS IV SOLN
INTRAVENOUS | Status: DC
  Administered 2017-02-20 (×2): via INTRAVENOUS

## 2017-02-20 MED ORDER — LIDOCAINE 2% (20 MG/ML) 5 ML SYRINGE
INTRAMUSCULAR | Status: DC | PRN
  Administered 2017-02-20: 100 mg via INTRAVENOUS

## 2017-02-20 SURGICAL SUPPLY — 62 items
ADH SKN CLS APL DERMABOND .7 (GAUZE/BANDAGES/DRESSINGS) ×1
APPLICATOR COTTON TIP 6IN STRL (MISCELLANEOUS) IMPLANT
APPLIER CLIP 5 13 M/L LIGAMAX5 (MISCELLANEOUS)
APPLIER CLIP ROT 10 11.4 M/L (STAPLE)
APPLIER CLIP ROT 13.4 12 LRG (CLIP)
APR CLP LRG 13.4X12 ROT 20 MLT (CLIP)
APR CLP MED LRG 11.4X10 (STAPLE)
APR CLP MED LRG 5 ANG JAW (MISCELLANEOUS)
BLADE SURG 15 STRL LF DISP TIS (BLADE) ×1 IMPLANT
BLADE SURG 15 STRL SS (BLADE) ×2
CABLE HIGH FREQUENCY MONO STRZ (ELECTRODE) ×2 IMPLANT
CLIP APPLIE 5 13 M/L LIGAMAX5 (MISCELLANEOUS) IMPLANT
CLIP APPLIE ROT 10 11.4 M/L (STAPLE) IMPLANT
CLIP APPLIE ROT 13.4 12 LRG (CLIP) IMPLANT
DERMABOND ADVANCED (GAUZE/BANDAGES/DRESSINGS) ×1
DERMABOND ADVANCED .7 DNX12 (GAUZE/BANDAGES/DRESSINGS) IMPLANT
DEVICE PMI PUNCTURE CLOSURE (MISCELLANEOUS) ×1 IMPLANT
DEVICE SUT QUICK LOAD TK 5 (STAPLE) IMPLANT
DEVICE SUT TI-KNOT TK 5X26 (MISCELLANEOUS) IMPLANT
DEVICE SUTURE ENDOST 10MM (ENDOMECHANICALS) IMPLANT
DEVICE TROCAR PUNCTURE CLOSURE (ENDOMECHANICALS) ×2 IMPLANT
DISSECTOR BLUNT TIP ENDO 5MM (MISCELLANEOUS) IMPLANT
ELECT REM PT RETURN 15FT ADLT (MISCELLANEOUS) ×2 IMPLANT
GAUZE SPONGE 4X4 12PLY STRL (GAUZE/BANDAGES/DRESSINGS) IMPLANT
GLOVE BIOGEL M 8.0 STRL (GLOVE) ×2 IMPLANT
GOWN STRL REUS W/TWL XL LVL3 (GOWN DISPOSABLE) ×8 IMPLANT
HANDLE STAPLE EGIA 4 XL (STAPLE) ×2 IMPLANT
HOVERMATT SINGLE USE (MISCELLANEOUS) ×2 IMPLANT
KIT BASIN OR (CUSTOM PROCEDURE TRAY) ×2 IMPLANT
MARKER SKIN DUAL TIP RULER LAB (MISCELLANEOUS) ×2 IMPLANT
NDL SPNL 22GX3.5 QUINCKE BK (NEEDLE) ×1 IMPLANT
NEEDLE SPNL 22GX3.5 QUINCKE BK (NEEDLE) ×2 IMPLANT
PACK UNIVERSAL I (CUSTOM PROCEDURE TRAY) ×2 IMPLANT
RELOAD STAPLE 45 PURP MED/THCK (STAPLE) IMPLANT
RELOAD TRI 45 ART MED THCK BLK (STAPLE) ×2 IMPLANT
RELOAD TRI 45 ART MED THCK PUR (STAPLE) ×2 IMPLANT
RELOAD TRI 60 ART MED THCK BLK (STAPLE) ×2 IMPLANT
RELOAD TRI 60 ART MED THCK PUR (STAPLE) ×2 IMPLANT
SCISSORS LAP 5X45 EPIX DISP (ENDOMECHANICALS) IMPLANT
SET IRRIG TUBING LAPAROSCOPIC (IRRIGATION / IRRIGATOR) ×2 IMPLANT
SHEARS HARMONIC ACE PLUS 45CM (MISCELLANEOUS) ×2 IMPLANT
SLEEVE ADV FIXATION 5X100MM (TROCAR) ×4 IMPLANT
SLEEVE GASTRECTOMY 36FR VISIGI (MISCELLANEOUS) ×2 IMPLANT
SOLUTION ANTI FOG 6CC (MISCELLANEOUS) ×2 IMPLANT
SPONGE LAP 18X18 X RAY DECT (DISPOSABLE) ×2 IMPLANT
STAPLER VISISTAT 35W (STAPLE) ×2 IMPLANT
SUT MNCRL AB 4-0 PS2 18 (SUTURE) ×3 IMPLANT
SUT SURGIDAC NAB ES-9 0 48 120 (SUTURE) IMPLANT
SUT VIC AB 4-0 SH 18 (SUTURE) ×2 IMPLANT
SUT VICRYL 0 TIES 12 18 (SUTURE) ×2 IMPLANT
SYR 10ML ECCENTRIC (SYRINGE) ×2 IMPLANT
SYR 20CC LL (SYRINGE) ×3 IMPLANT
SYR 50ML LL SCALE MARK (SYRINGE) ×2 IMPLANT
TOWEL OR 17X26 10 PK STRL BLUE (TOWEL DISPOSABLE) ×4 IMPLANT
TOWEL OR NON WOVEN STRL DISP B (DISPOSABLE) ×2 IMPLANT
TROCAR ADV FIXATION 5X100MM (TROCAR) ×2 IMPLANT
TROCAR BLADELESS 15MM (ENDOMECHANICALS) ×2 IMPLANT
TROCAR BLADELESS OPT 5 100 (ENDOMECHANICALS) ×2 IMPLANT
TUBE CALIBRATION LAPBAND (TUBING) ×1 IMPLANT
TUBING CONNECTING 10 (TUBING) ×2 IMPLANT
TUBING ENDO SMARTCAP (MISCELLANEOUS) ×2 IMPLANT
TUBING INSUF HEATED (TUBING) ×2 IMPLANT

## 2017-02-20 NOTE — Progress Notes (Signed)
Dr. Fransisco Beau notified of potassium 5.4.

## 2017-02-20 NOTE — Transfer of Care (Signed)
Immediate Anesthesia Transfer of Care Note  Patient: Yvette White  Procedure(s) Performed: Procedure(s): LAPAROSCOPIC GASTRIC SLEEVE RESECTION WITH UPPER ENDOSCOPY (N/A)  Patient Location: PACU  Anesthesia Type:General  Level of Consciousness: awake, alert  and oriented  Airway & Oxygen Therapy: Patient Spontanous Breathing and Patient connected to face mask oxygen  Post-op Assessment: Report given to RN and Post -op Vital signs reviewed and stable  Post vital signs: Reviewed and stable  Last Vitals:  Vitals:   02/20/17 0940 02/20/17 1014  BP: (!) 177/93 (!) 141/69  Pulse: 77   Resp: 16   Temp: 36.6 C   SpO2: 97%     Last Pain:  Vitals:   02/20/17 0940  TempSrc: Oral      Patients Stated Pain Goal: 4 (14/48/18 5631)  Complications: No apparent anesthesia complications

## 2017-02-20 NOTE — Anesthesia Procedure Notes (Signed)
Procedure Name: Intubation Date/Time: 02/20/2017 11:41 AM Performed by: Sherian Maroon A Pre-anesthesia Checklist: Patient identified, Emergency Drugs available, Suction available and Patient being monitored Patient Re-evaluated:Patient Re-evaluated prior to induction Oxygen Delivery Method: Circle system utilized Preoxygenation: Pre-oxygenation with 100% oxygen Induction Type: IV induction Ventilation: Mask ventilation without difficulty Laryngoscope Size: Mac and 4 Grade View: Grade II Tube type: Oral Tube size: 7.5 mm Number of attempts: 1 Airway Equipment and Method: Stylet and Oral airway Placement Confirmation: ETT inserted through vocal cords under direct vision,  positive ETCO2 and breath sounds checked- equal and bilateral Secured at: 22 cm Tube secured with: Tape Dental Injury: Teeth and Oropharynx as per pre-operative assessment

## 2017-02-20 NOTE — Progress Notes (Signed)
Pt will self administer when ready for bed.  RT to monitor and assess as needed.

## 2017-02-20 NOTE — Anesthesia Postprocedure Evaluation (Signed)
Anesthesia Post Note  Patient: Yvette White  Procedure(s) Performed: Procedure(s) (LRB): LAPAROSCOPIC GASTRIC SLEEVE RESECTION WITH UPPER ENDOSCOPY (N/A)     Patient location during evaluation: PACU Anesthesia Type: General Level of consciousness: awake and alert Pain management: pain level controlled Vital Signs Assessment: post-procedure vital signs reviewed and stable Respiratory status: spontaneous breathing, nonlabored ventilation, respiratory function stable and patient connected to nasal cannula oxygen Cardiovascular status: blood pressure returned to baseline and stable Postop Assessment: no apparent nausea or vomiting Anesthetic complications: no    Last Vitals:  Vitals:   02/20/17 1515 02/20/17 1530  BP: (!) 145/100 (!) 151/80  Pulse: 81 77  Resp: 18 14  Temp:  (!) 36.4 C  SpO2: 96% 96%    Last Pain:  Vitals:   02/20/17 1530  TempSrc:   PainSc: [name]

## 2017-02-20 NOTE — Op Note (Signed)
Surgeon: Kaylyn Lim, MD, FACS  Asst:  Alphonsa Overall, MD, FACS  Anes:  General endotracheal  Procedure: Laparoscopic sleeve gastrectomy and upper endoscopy  Diagnosis: Morbid obesity  Complications: none  EBL:   10 cc  Description of Procedure:  The patient was take to OR 2 and given general anesthesia.  The abdomen was prepped with PCMX and draped sterilely.  A timeout was performed.  Access to the abdomen was achieved with a 35mm Optiview through the left upper quadrant.  Following insufflation, the state of the abdomen was found to be free of adhesions.  The hiatus was assessed with the balloon catheter and no hiatal hernia was seen.   The ViSiGi 36Fr tube was inserted to deflate the stomach and was pulled back into the esophagus.    The pylorus was identified and we measured 5 cm back and marked the antrum.  At that point we began dissection to take down the greater curvature of the stomach using the Harmonic scalpel.  This dissection was taken all the way up to the left crus.  Posterior attachments of the stomach were also taken down.    The ViSiGi tube was then passed into the antrum and suction applied so that it was snug along the lessor curvature.  The "crow's foot" or incisura was identified.  The sleeve gastrectomy was begun using the [employer] stapler beginning with a 4.5 cm black with TRS.  This was followed by a 6 cm black with TRS and then purple loads with TRS.  When the sleeve was complete the tube was taken off suction and insufflated briefly.  The tube was withdrawn.  Upper endoscopy was then performed by Dr. Lucia Gaskins and a symmetric sleeve without bubbles or bleeding was seen. .     The specimen was extracted through the 15 trocar site.  Wounds were infiltrated with Exparel and closed with 4-0 Monocryl.  The PRI was used to close the 15 mm port with a 0 vicryl suture.    Yvette White Done, [place], [facility], [place]

## 2017-02-20 NOTE — Interval H&P Note (Signed)
History and Physical Interval Note:  02/20/2017 10:53 AM  Yvette White  has presented today for surgery, with the diagnosis of Morbid Obesity, DM II, GERD, Sleep Apnea  The various methods of treatment have been discussed with the patient and family. After consideration of risks, benefits and other options for treatment, the patient has consented to  Procedure(s): LAPAROSCOPIC GASTRIC SLEEVE RESECTION (N/A) as a surgical intervention .  The patient's history has been reviewed, patient examined, no change in status, stable for surgery.  I have reviewed the patient's chart and labs.  Questions were answered to the patient's satisfaction.     Kristianna Saperstein B

## 2017-02-20 NOTE — Anesthesia Preprocedure Evaluation (Addendum)
Anesthesia Evaluation  Patient identified by MRN, date of birth, ID band Patient awake    Reviewed: Allergy & Precautions, NPO status , Patient's Chart, lab work & pertinent test results  Airway Mallampati: I  TM Distance: >3 FB Neck ROM: Full    Dental  (+) Teeth Intact, Dental Advisory Given   Pulmonary sleep apnea and Continuous Positive Airway Pressure Ventilation , former smoker,    breath sounds clear to auscultation       Cardiovascular negative cardio ROS   Rhythm:Regular Rate:Normal  Exercise Stress Test 08/2016 - Nuclear stress: Horizontal ST segment depression ST segment depression of 1 mm was noted during stress in the II, III and aVF leads. The test was stopped because the patient complained of fatigue.No reversible ischemia. LVEF 56% with normal wall motion. This is a low risk study.  TTE 07/2016 - Mild concentric hypertrophy. Ejection fraction was in the range of 55% to 60%. Grade 2 diastolic dysfunction. Left atrium was moderately dilated.    Neuro/Psych  Headaches, PSYCHIATRIC DISORDERS Anxiety Depression Bipolar Disorder    GI/Hepatic Neg liver ROS, GERD  Medicated and Controlled,  Endo/Other  Hyperthyroidism Morbid obesity  Renal/GU negative Renal ROS  negative genitourinary   Musculoskeletal  (+) Arthritis , Osteoarthritis,    Abdominal (+) + obese,   Peds negative pediatric ROS (+)  Hematology  (+) anemia ,   Anesthesia Other Findings Neurocardiogenic Syncope  Reproductive/Obstetrics negative OB ROS                           Lab Results  Component Value Date   WBC 6.8 02/16/2017   HGB 12.7 02/16/2017   HCT 38.4 02/16/2017   MCV 79.8 02/16/2017   PLT 258 02/16/2017   No results found for: INR, PROTIME   Anesthesia Physical  Anesthesia Plan  ASA: III  Anesthesia Plan: General   Post-op Pain Management:    Induction: Intravenous  PONV Risk Score and Plan: 4  or greater and Ondansetron, Dexamethasone, Midazolam, Scopolamine patch - Pre-op, Propofol infusion and Treatment may vary due to age or medical condition  Airway Management Planned: Oral ETT  Additional Equipment: None  Intra-op Plan:   Post-operative Plan: Extubation in OR  Informed Consent: I have reviewed the patients History and Physical, chart, labs and discussed the procedure including the risks, benefits and alternatives for the proposed anesthesia with the patient or authorized representative who has indicated his/her understanding and acceptance.   Dental advisory given  Plan Discussed with: CRNA  Anesthesia Plan Comments:         Anesthesia Quick Evaluation

## 2017-02-21 ENCOUNTER — Encounter (HOSPITAL_COMMUNITY): Payer: Self-pay | Admitting: Surgery

## 2017-02-21 LAB — CBC WITH DIFFERENTIAL/PLATELET
Basophils Absolute: 0 10*3/uL (ref 0.0–0.1)
Basophils Relative: 0 %
EOS PCT: 0 %
Eosinophils Absolute: 0 10*3/uL (ref 0.0–0.7)
HEMATOCRIT: 36 % (ref 36.0–46.0)
Hemoglobin: 11.7 g/dL — ABNORMAL LOW (ref 12.0–15.0)
LYMPHS ABS: 0.8 10*3/uL (ref 0.7–4.0)
LYMPHS PCT: 7 %
MCH: 26.8 pg (ref 26.0–34.0)
MCHC: 32.5 g/dL (ref 30.0–36.0)
MCV: 82.4 fL (ref 78.0–100.0)
Monocytes Absolute: 0.7 10*3/uL (ref 0.1–1.0)
Monocytes Relative: 6 %
Neutro Abs: 10.5 10*3/uL — ABNORMAL HIGH (ref 1.7–7.7)
Neutrophils Relative %: 87 %
PLATELETS: 281 10*3/uL (ref 150–400)
RBC: 4.37 MIL/uL (ref 3.87–5.11)
RDW: 14.2 % (ref 11.5–15.5)
WBC: 12.1 10*3/uL — AB (ref 4.0–10.5)

## 2017-02-21 MED ORDER — MENTHOL 3 MG MT LOZG
1.0000 | LOZENGE | OROMUCOSAL | Status: DC | PRN
  Filled 2017-02-21: qty 9

## 2017-02-21 MED ORDER — BISACODYL 10 MG RE SUPP
10.0000 mg | Freq: Once | RECTAL | Status: AC
  Administered 2017-02-21: 10 mg via RECTAL
  Filled 2017-02-21: qty 1

## 2017-02-21 NOTE — Discharge Instructions (Signed)

## 2017-02-21 NOTE — Progress Notes (Signed)
Nutrition Education Note  Received consult for diet education per DROP protocol.   Discussed 2 week post op diet with pt. Emphasized that liquids must be non carbonated, non caffeinated, and sugar free. Fluid goals discussed. Pt to follow up with outpatient bariatric RD for further diet progression after 2 weeks. Multivitamins and minerals also reviewed. Teach back method used, pt expressed understanding, expect good compliance.  Pt reports not consuming protein shake at this time. Per RN note pt needs 2 additional ounces of clear fluids before they are initiated. Pt inquired about consuming yogurt and requested another copy of the handouts. RD to provide. Pt reports she ordered her multivitamin and it should be delivered today. Pt reports she has a lot of protein drinks at home to continue to try to find the ones she likes.   Diet: First 2 Weeks  You will see the nutritionist about two (2) weeks after your surgery. The nutritionist will increase the types of foods you can eat if you are handling liquids well:  If you have severe vomiting or nausea and cannot handle clear liquids lasting longer than 1 day, call your surgeon  Protein Shake  Drink at least 2 ounces of shake 5-6 times per day  Each serving of protein shakes (usually 8 - 12 ounces) should have a minimum of:  15 grams of protein  And no more than 5 grams of carbohydrate  Goal for protein each day:  Men = 80 grams per day  Women = 60 grams per day  Protein powder may be added to fluids such as non-fat milk or Lactaid milk or Soy milk (limit to 35 grams added protein powder per serving)   Hydration  Slowly increase the amount of water and other clear liquids as tolerated (See Acceptable Fluids)  Slowly increase the amount of protein shake as tolerated  Sip fluids slowly and throughout the day  May use sugar substitutes in small amounts (no more than 6 - 8 packets per day; i.e. Splenda)   Fluid Goal  The first goal is to drink at  least 8 ounces of protein shake/drink per day (or as directed by the nutritionist); some examples of protein shakes are Premier Protein, [employer], [employer], EAS Edge HP, and Unjury. See handout from pre-op Bariatric Education Class:  Slowly increase the amount of protein shake you drink as tolerated  You may find it easier to slowly sip shakes throughout the day  It is important to get your proteins in first  Your fluid goal is to drink 64 - 100 ounces of fluid daily  It may take a few weeks to build up to this  32 oz (or more) should be clear liquids  And  32 oz (or more) should be full liquids (see below for examples)  Liquids should not contain sugar, caffeine, or carbonation   Clear Liquids:  Water or Sugar-free flavored water (i.e. Fruit H2O, Propel)  Decaffeinated coffee or tea (sugar-free)  Crystal Lite, Wyler's Lite, Minute Maid Lite  Sugar-free Jell-O  Bouillon or broth  Sugar-free Popsicle: *Less than 20 calories each; Limit 1 per day   Full Liquids:  Protein Shakes/Drinks + 2 choices per day of other full liquids  Full liquids must be:  No More Than 12 grams of Carbs per serving  No More Than 3 grams of Fat per serving  Strained low-fat cream soup  Non-Fat milk  Fat-free Lactaid Milk  Sugar-free yogurt (Dannon Lite & Fit, [place] yogurt, Oikos  Zero)   [place] Ranger, MS, RDN, LDN 02/21/2017 12:02 PM

## 2017-02-21 NOTE — Progress Notes (Signed)
Patient alert and oriented, Post op day 1.  Provided support and encouragement.  Encouraged pulmonary toilet, ambulation and small sips of liquids. Needs 2 additional ounces of clear fluids before protein began.  All questions answered.  Will continue to monitor.

## 2017-02-21 NOTE — Progress Notes (Signed)
[facility] Progress Note:   1 Day Post-Op  Subjective: Mental status is clear Objective: Vital signs in last 24 hours: Temp:  [97.8 F (36.6 C)-99.5 F (37.5 C)] 99.2 F (37.3 C) (09/26 1823) Pulse Rate:  [61-87] 61 (09/26 1823) Resp:  [18-20] 18 (09/26 1823) BP: (132-164)/(64-77) 164/73 (09/26 1823) SpO2:  [95 %-98 %] 96 % (09/26 1823) Weight:  [162.8 kg (359 lb)] 162.8 kg (359 lb) (09/26 1642)  Intake/Output from previous day: 09/25 0701 - 09/26 0700 In: 2700 [I.V.:2700] Out: 2950 [Urine:2950] Intake/Output this shift: No intake/output data recorded.  Physical Exam: Work of breathing is normal.  Taking liquids more slowly  Lab Results:  Results for orders placed or performed during the hospital encounter of 02/20/17 (from the past 48 hour(s))  CBC     Status: Abnormal   Collection Time: 02/20/17  5:38 PM  Result Value Ref Range   WBC 10.8 (H) 4.0 - 10.5 K/uL   RBC 4.70 3.87 - 5.11 MIL/uL   Hemoglobin 12.6 12.0 - 15.0 g/dL   HCT 37.6 36.0 - 46.0 %   MCV 80.0 78.0 - 100.0 fL   MCH 26.8 26.0 - 34.0 pg   MCHC 33.5 30.0 - 36.0 g/dL   RDW 13.8 11.5 - 15.5 %   Platelets 231 150 - 400 K/uL  Creatinine, serum     Status: None   Collection Time: 02/20/17  5:38 PM  Result Value Ref Range   Creatinine, Ser 0.99 0.44 - 1.00 mg/dL   GFR calc non Af Amer >60 >60 mL/min   GFR calc Af Amer >60 >60 mL/min    Comment: (NOTE) The eGFR has been calculated using the CKD EPI equation. This calculation has not been validated in all clinical situations. eGFR's persistently <60 mL/min signify possible Chronic Kidney Disease.   CBC WITH DIFFERENTIAL     Status: Abnormal   Collection Time: 02/21/17  5:18 AM  Result Value Ref Range   WBC 12.1 (H) 4.0 - 10.5 K/uL   RBC 4.37 3.87 - 5.11 MIL/uL   Hemoglobin 11.7 (L) 12.0 - 15.0 g/dL   HCT 36.0 36.0 - 46.0 %   MCV 82.4 78.0 - 100.0 fL   MCH 26.8 26.0 - 34.0 pg   MCHC 32.5 30.0 - 36.0 g/dL   RDW 14.2 11.5 - 15.5 %   Platelets 281 150 - 400 K/uL   Neutrophils Relative % 87 %   Neutro Abs 10.5 (H) 1.7 - 7.7 K/uL   Lymphocytes Relative 7 %   Lymphs Abs 0.8 0.7 - 4.0 K/uL   Monocytes Relative 6 %   Monocytes Absolute 0.7 0.1 - 1.0 K/uL   Eosinophils Relative 0 %   Eosinophils Absolute 0.0 0.0 - 0.7 K/uL   Basophils Relative 0 %   Basophils Absolute 0.0 0.0 - 0.1 K/uL    Radiology/Results: No results found.  Anti-infectives: Anti-infectives    Start     Dose/Rate Route Frequency Ordered Stop   02/20/17 0945  cefoTEtan in Dextrose 5% (CEFOTAN) IVPB 2 g     2 g Intravenous On call to O.R. 02/20/17 0937 02/20/17 1143      Assessment/Plan: Problem List: Patient Active Problem List   Diagnosis Date Noted  . 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 Class 3 obesity with serious comorbidity and body mass index (BMI) of 60.0 to 69.9 in adult ([place]) 01/24/2017  . Bipolar 1 disorder ([place]) 01/24/2017  . Other fatigue 01/24/2017  . Morbid  obesity ([place]) 07/13/2016  . Heart palpitations 07/13/2016  . Shortness of breath on exertion 07/13/2016    Advance diet and continue observation 1 Day Post-Op    LOS: 1 day   Matt B. Hassell Done, MD, [facility] [phone] beeper [phone]  02/21/2017 8:21 PM

## 2017-02-22 LAB — CBC WITH DIFFERENTIAL/PLATELET
Basophils Absolute: 0 10*3/uL (ref 0.0–0.1)
Basophils Relative: 0 %
EOS ABS: 0.1 10*3/uL (ref 0.0–0.7)
EOS PCT: 1 %
HCT: 35.6 % — ABNORMAL LOW (ref 36.0–46.0)
Hemoglobin: 11.4 g/dL — ABNORMAL LOW (ref 12.0–15.0)
LYMPHS ABS: 1.6 10*3/uL (ref 0.7–4.0)
LYMPHS PCT: 18 %
MCH: 26.1 pg (ref 26.0–34.0)
MCHC: 32 g/dL (ref 30.0–36.0)
MCV: 81.7 fL (ref 78.0–100.0)
MONOS PCT: 10 %
Monocytes Absolute: 0.9 10*3/uL (ref 0.1–1.0)
Neutro Abs: 6.4 10*3/uL (ref 1.7–7.7)
Neutrophils Relative %: 71 %
PLATELETS: 253 10*3/uL (ref 150–400)
RBC: 4.36 MIL/uL (ref 3.87–5.11)
RDW: 14.4 % (ref 11.5–15.5)
WBC: 8.9 10*3/uL (ref 4.0–10.5)

## 2017-02-22 NOTE — Progress Notes (Signed)
Discharge and medication instructions reviewed with patient and her mother. Questions answered and both deny further questions. No prescriptions given to patient. Mother is driving her home. Donne Hazel, RN

## 2017-02-22 NOTE — Discharge Summary (Signed)
Physician Discharge Summary  Patient ID: Yvette White MRN: [id number] DOB/AGE: 09-14-1970 46 y.o.  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  PD 1 began liquids but not taking enough.  PD was taking liquids adequately and ready for discharge  Consults: none  Significant Diagnostic Studies: none    Discharge Exam: Blood pressure 139/66, pulse (!) 55, temperature 98.2 F (36.8 C), temperature source Oral, resp. rate 18, height 5\' 6"  (1.676 m), weight (!) 165.4 kg (364 lb 9.6 oz), last menstrual period 02/20/2017, SpO2 98 %. Incisions OK  Disposition: 01-Home or Self Care  Discharge Instructions    Ambulate hourly while awake    Complete by:  As directed    Call MD for:  difficulty breathing, headache or visual disturbances    Complete by:  As directed    Call MD for:  persistant dizziness or light-headedness    Complete by:  As directed    Call MD for:  persistant nausea and vomiting    Complete by:  As directed    Call MD for:  redness, tenderness, or signs of infection (pain, swelling, redness, odor or green/yellow discharge around incision site)    Complete by:  As directed    Call MD for:  severe uncontrolled pain    Complete by:  As directed    Call MD for:  temperature >101 F    Complete by:  As directed    Diet bariatric full liquid    Complete by:  As directed    Incentive spirometry    Complete by:  As directed    Perform hourly while awake     Allergies as of 02/22/2017      Reactions   Theophylline    Other reaction(s): Other (See Comments) Hyperactive "Slo phylinne"   Penicillins Rash   Has patient had a PCN reaction causing immediate rash, facial/tongue/throat swelling, SOB or lightheadedness with  hypotension:unsure Has patient had a PCN reaction causing severe rash involving mucus membranes or skin necrosis:unsure Has patient had a PCN reaction that required hospitalization:No Has patient had a PCN reaction occurring within the last 10 years: No If all of the above answers are "NO", then may proceed with Cephalosporin use.   Pseudoephedrine Rash   Sulfamethoxazole-trimethoprim Rash      Medication List    TAKE these medications   acetaminophen 500 MG tablet Commonly known as:  TYLENOL Take 1,000 mg by mouth every 6 (six) hours as needed (for pain.).   amphetamine-dextroamphetamine 20 MG tablet Commonly known as:  ADDERALL Take 20 mg by mouth daily.   ANTACID PO Take 2 tablets by mouth as needed (for breakthrough acid reflux.).   clonazePAM 0.5 MG tablet Commonly known as:  KLONOPIN Take 0.5-1 mg by mouth 3 (three) times daily as needed for sleep or anxiety.   cyclobenzaprine 10 MG tablet Commonly known as:  FLEXERIL Take 10 mg by mouth at bedtime.   lamoTRIgine 200 MG tablet Commonly known as:  LAMICTAL Take 200 mg by mouth every evening.   loratadine 10 MG tablet Commonly known as:  CLARITIN Take 10 mg by mouth every evening.   meloxicam 15 MG tablet Commonly known as:  MOBIC Take 15 mg by mouth daily. Notes to patient:  Avoid NSAIDs  for 6-8 weeks after surgery   omeprazole 40 MG capsule Commonly known as:  PRILOSEC Take 40 mg by mouth daily.   Vitamin D (Ergocalciferol) 50000 units Caps capsule Commonly known as:  DRISDOL Take 50,000 Units by mouth every 7 (seven) days.            Discharge Care Instructions        Start     Ordered   02/22/17 0000  Diet bariatric full liquid     02/22/17 1638   02/22/17 0000  Ambulate hourly while awake     02/22/17 1638   02/22/17 0000  Incentive spirometry    Comments:  Perform hourly while awake   02/22/17 1638   02/22/17 0000  Call MD for:  temperature >101 F     02/22/17 1638   02/22/17 0000  Call  MD for:  persistant nausea and vomiting     02/22/17 1638   02/22/17 0000  Call MD for:  severe uncontrolled pain     02/22/17 1638   02/22/17 0000  Call MD for:  redness, tenderness, or signs of infection (pain, swelling, redness, odor or green/yellow discharge around incision site)     02/22/17 1638   02/22/17 0000  Call MD for:  difficulty breathing, headache or visual disturbances     02/22/17 1638   02/22/17 0000  Call MD for:  persistant dizziness or light-headedness     02/22/17 1638     Follow-up Information    [facility]. Go on 03/14/2017.   Specialty:  General Surgery Why:  Dr Hassell Done at [address] information: [address] [place] [address] [phone]        [facility] Follow up.   Specialty:  General Surgery Contact information: [address] [place] [address] [phone]           Signed: Pedro Earls 02/22/2017, 4:38 PM

## 2017-02-22 NOTE — Progress Notes (Signed)
Patient alert and oriented, Post op day 2.  Provided support and encouragement.  Encouraged pulmonary toilet, ambulation and small sips of liquids. Started protein without difficulty.  All questions answered.  Will continue to monitor.

## 2017-02-22 NOTE — Progress Notes (Signed)
Patient alert and oriented, pain is controlled. Patient is tolerating fluids, advanced to protein shake today, patient is tolerating well.  Reviewed Gastric sleeve discharge instructions with patient and patient is able to articulate understanding.  Provided information on BELT program, Support Group and [facility] outpatient pharmacy. All questions answered, will continue to monitor.  

## 2017-02-26 ENCOUNTER — Telehealth (HOSPITAL_COMMUNITY): Payer: Self-pay

## 2017-02-26 NOTE — Telephone Encounter (Addendum)
Voice mail with contact information provided to patient for follow up question post bariatric surgery on Monday, Feb 26, 2017.  See below questions.   Made discharge phone call to patient. Asking the following questions.    1. Do you have someone to care for you now that you are home?  independent 2. Are you having pain now that is not relieved by your pain medication?  Not using pain medicine 3. Are you able to drink the recommended daily amount of fluids (48 ounces minimum/day) and protein (60-80 grams/day) as prescribed by the dietitian or nutritional counselor?  40 ounces of fluids, 43 grams protein 4. Are you taking the vitamins and minerals as prescribed?  yes 5. Do you have the "on call" number to contact your surgeon if you have a problem or question?  yes 6. Are your incisions free of redness, swelling or drainage? (If steri strips, address that these can fall off, shower as tolerated) yes 7. Have your bowels moved since your surgery?  If not, are you passing gas?  Yes,  Taking miralax 8. Are you up and walking 3-4 times per day?  yes 9. Were you provided your discharge medications before your surgery or before you were discharged from the hospital and are you taking them without problem?  yes

## 2017-02-28 ENCOUNTER — Telehealth (HOSPITAL_COMMUNITY): Payer: Self-pay

## 2017-02-28 ENCOUNTER — Telehealth: Payer: Self-pay | Admitting: Neurology

## 2017-02-28 DIAGNOSIS — Z6841 Body Mass Index (BMI) 40.0 and over, adult: Secondary | ICD-10-CM | POA: Diagnosis not present

## 2017-02-28 DIAGNOSIS — R399 Unspecified symptoms and signs involving the genitourinary system: Secondary | ICD-10-CM | POA: Diagnosis not present

## 2017-02-28 DIAGNOSIS — N3289 Other specified disorders of bladder: Secondary | ICD-10-CM | POA: Diagnosis not present

## 2017-02-28 NOTE — Telephone Encounter (Signed)
Called patient to discuss further. Pt can be placed on NP schedule if its the cpap follow and if that will allow her to be seen sooner. Pt didn't answer. LVM for pt to call back

## 2017-02-28 NOTE — Telephone Encounter (Signed)
Patient contact [facility] via email  On 02/27/2017 at 2000 to ask about pelvic pain.  Patient instructed to follow up with primary care doctor, if unable to follow up in timely manner and pain persisted or fever occurred to call  [facility] answering service for further instruction.  Spoke with patient this am and has appointment for follow up with primary.

## 2017-02-28 NOTE — Telephone Encounter (Signed)
Pt called she is not happy with the care from [facility] or the CPAP she is using. She missed appt 7.16 for CPAP f/u. Pt is scheduled for 12/10 and is on wait list. If she can be seen sooner please call.   FYI

## 2017-03-01 NOTE — Telephone Encounter (Signed)
I called pt again to discuss. No answer, left a message asking her to call me back. If pt calls back, please offer hear cpap follow up appt with the NP and ask what her specific concerns with [facility] are.

## 2017-03-06 ENCOUNTER — Encounter: Payer: BLUE CROSS/BLUE SHIELD | Attending: Surgery | Admitting: Registered"

## 2017-03-06 DIAGNOSIS — Z713 Dietary counseling and surveillance: Secondary | ICD-10-CM | POA: Insufficient documentation

## 2017-03-06 DIAGNOSIS — Z6841 Body Mass Index (BMI) 40.0 and over, adult: Secondary | ICD-10-CM | POA: Insufficient documentation

## 2017-03-06 DIAGNOSIS — E669 Obesity, unspecified: Secondary | ICD-10-CM

## 2017-03-06 NOTE — Telephone Encounter (Signed)
I called pt again to discuss. No answer, left a message asking her to call me back. This is our third attempt at reaching pt to offer her a sooner appt with the NP and discuss her concerns with [facility]. I will send her a letter asking her to call me back.  If pt calls back, please see if the NPs have a sooner opening than 12/10 with Dr. Rexene Alberts. Also, please ask her about her concerns with [facility], and ask if she wants to switch DMEs. We don't have any guarantee of a better DME. I always encourage pt's to call [facility] at [phone] to discuss her concerns as well.

## 2017-03-07 DIAGNOSIS — F411 Generalized anxiety disorder: Secondary | ICD-10-CM | POA: Diagnosis not present

## 2017-03-08 NOTE — Progress Notes (Signed)
Bariatric Class:  Appt start time: 1530 end time:  1630.  2 Week Post-Operative Nutrition Class  Patient was seen on 03/07/2017 for Post-Operative Nutrition education at [facility].   Surgery date: 02/20/2017 Surgery type: Sleeve gastrectomy Start weight at [facility]: 381.9 Weight today: 348.8 Weight change: 33.1 lbs loss  Pt states she is not taking Mobic anymore. Pt states she has had a few headaches, more sweating than usual, and some constipation.   TANITA  BODY COMP RESULTS  03/07/2017   BMI (kg/m^2) 56.3   Fat Mass (lbs) 200.6   Fat Free Mass (lbs) 148.2   Total Body Water (lbs) 112.2   The following the learning objectives were met by the patient during this course:  Identifies Phase 3A (Soft, High Proteins) Dietary Goals and will begin from 2 weeks post-operatively to 2 months post-operatively  Identifies appropriate sources of fluids and proteins   States protein recommendations and appropriate sources post-operatively  Identifies the need for appropriate texture modifications, mastication, and bite sizes when consuming solids  Identifies appropriate multivitamin and calcium sources post-operatively  Describes the need for physical activity post-operatively and will follow MD recommendations  States when to call healthcare provider regarding medication questions or post-operative complications  Handouts given during class include:  Phase 3A: Soft, High Protein Diet Handout  Follow-Up Plan: Patient will follow-up at [facility] in 6 weeks for 2 month post-op nutrition visit for diet advancement per MD.

## 2017-03-13 DIAGNOSIS — F4320 Adjustment disorder, unspecified: Secondary | ICD-10-CM | POA: Diagnosis not present

## 2017-03-20 DIAGNOSIS — G4733 Obstructive sleep apnea (adult) (pediatric): Secondary | ICD-10-CM | POA: Diagnosis not present

## 2017-03-20 DIAGNOSIS — F4320 Adjustment disorder, unspecified: Secondary | ICD-10-CM | POA: Diagnosis not present

## 2017-03-27 DIAGNOSIS — D508 Other iron deficiency anemias: Secondary | ICD-10-CM | POA: Diagnosis not present

## 2017-03-27 DIAGNOSIS — R7301 Impaired fasting glucose: Secondary | ICD-10-CM | POA: Diagnosis not present

## 2017-03-27 DIAGNOSIS — Z9884 Bariatric surgery status: Secondary | ICD-10-CM | POA: Diagnosis not present

## 2017-03-27 DIAGNOSIS — E559 Vitamin D deficiency, unspecified: Secondary | ICD-10-CM | POA: Diagnosis not present

## 2017-03-27 DIAGNOSIS — Z6841 Body Mass Index (BMI) 40.0 and over, adult: Secondary | ICD-10-CM | POA: Diagnosis not present

## 2017-03-27 DIAGNOSIS — F411 Generalized anxiety disorder: Secondary | ICD-10-CM | POA: Diagnosis not present

## 2017-03-27 LAB — CBC AND DIFFERENTIAL
HCT: 41 (ref 36–46)
Hemoglobin: 13.5 (ref 12.0–16.0)
Neutrophils Absolute: 6
Platelets: 276 (ref 150–399)
WBC: 7.5

## 2017-03-27 LAB — BASIC METABOLIC PANEL
BUN: 12 (ref 4–21)
CREATININE: 0.8 (ref 0.5–1.1)
GLUCOSE: 97
POTASSIUM: 4 (ref 3.4–5.3)
Sodium: 138 (ref 137–147)

## 2017-03-27 LAB — LIPID PANEL
Cholesterol: 120 (ref 0–200)
HDL: 29 — AB (ref 35–70)
LDL Cholesterol: 74
Triglycerides: 86 (ref 40–160)

## 2017-03-27 LAB — HEPATIC FUNCTION PANEL
ALT: 28 (ref 7–35)
AST: 25 (ref 13–35)
Alkaline Phosphatase: 100 (ref 25–125)
BILIRUBIN DIRECT: 0.6 — AB (ref 0.01–0.4)
BILIRUBIN, TOTAL: 0.6

## 2017-03-27 LAB — VITAMIN B12: VITAMIN B 12: 538

## 2017-03-27 LAB — HEMOGLOBIN A1C: Hemoglobin A1C: <5.7

## 2017-03-29 DIAGNOSIS — F4320 Adjustment disorder, unspecified: Secondary | ICD-10-CM | POA: Diagnosis not present

## 2017-04-04 ENCOUNTER — Ambulatory Visit (INDEPENDENT_AMBULATORY_CARE_PROVIDER_SITE_OTHER): Payer: BLUE CROSS/BLUE SHIELD | Admitting: Family Medicine

## 2017-04-04 VITALS — BP 118/74 | HR 55 | Temp 97.7°F | Ht 66.0 in | Wt 332.0 lb

## 2017-04-04 DIAGNOSIS — Z6841 Body Mass Index (BMI) 40.0 and over, adult: Secondary | ICD-10-CM | POA: Diagnosis not present

## 2017-04-04 DIAGNOSIS — K219 Gastro-esophageal reflux disease without esophagitis: Secondary | ICD-10-CM | POA: Diagnosis not present

## 2017-04-04 DIAGNOSIS — D508 Other iron deficiency anemias: Secondary | ICD-10-CM | POA: Diagnosis not present

## 2017-04-04 MED ORDER — IRON CARBONYL-VITAMIN C-FOS 30-10-25 MG PO CHEW: 1.0000 | CHEWABLE_TABLET | Freq: Every day | ORAL | Status: DC

## 2017-04-04 NOTE — Progress Notes (Signed)
Office: [phone]  /  Fax: [phone]   HPI:   Chief Complaint: OBESITY Yvette White is here to discuss her progress with her obesity treatment plan. She is on the Category 2 plan + 100 calories and then pre-op diet to help shrink liver and is following her eating plan approximately 0 % of the time. She states she is exercising 0 minutes 0 times per week. Yvette White had status post sleeve gastrectomy approximately 6 weeks ago. She states she is feeling fine but has just started feeling hungry. She is on soft foods and trying to get enough water. She finds herself skipping meals and eating more in the evening. She is journaling and working on increasing protein to 60 grams daily.  Her weight is (!) 332 lb (150.6 kg) today and has had a weight loss of 51 pounds over a period of 10 weeks since her last visit. She has lost 51 lbs since starting treatment with [place].  GERD (Gastroesophageal Reflux Disease) Yvette White is taking prilosec and Tums. She has status post gastric sleeve, but she is not having any GERD symptoms.  Iron Deficiency Anemia Yvette White has a diagnosis of iron deficiency anemia. Yvette White's iron was lower as expected after surgery. She started some bariatric vitamins but doesn't think they contain iron.   ALLERGIES: Allergies  Allergen Reactions  . Theophylline     Other reaction(s): Other (See Comments) Hyperactive  "Slo phylinne"  . Penicillins Rash    Has patient had a PCN reaction causing immediate rash, facial/tongue/throat swelling, SOB or lightheadedness with hypotension:unsure Has patient had a PCN reaction causing severe rash involving mucus membranes or skin necrosis:unsure Has patient had a PCN reaction that required hospitalization:No Has patient had a PCN reaction occurring within the last 10 years: No If all of the above answers are "NO", then may proceed with Cephalosporin use.   . Pseudoephedrine Rash  . Sulfamethoxazole-Trimethoprim Rash    MEDICATIONS: Current Outpatient  Medications on File Prior to Visit  Medication Sig Dispense Refill  . acetaminophen (TYLENOL) 500 MG tablet Take 1,000 mg by mouth every 6 (six) hours as needed (for pain.).    Marland Kitchen amphetamine-dextroamphetamine (ADDERALL) 20 MG tablet Take 20 mg by mouth daily.    . Calcium Carbonate Antacid (ANTACID PO) Take 2 tablets by mouth as needed (for breakthrough acid reflux.).     Marland Kitchen clonazePAM (KLONOPIN) 0.5 MG tablet Take 0.5-1 mg by mouth 3 (three) times daily as needed for sleep or anxiety.  3  . cyclobenzaprine (FLEXERIL) 10 MG tablet Take 10 mg by mouth at bedtime.  2  . lamoTRIgine (LAMICTAL) 200 MG tablet Take 200 mg by mouth every evening.     . loratadine (CLARITIN) 10 MG tablet Take 10 mg by mouth every evening.    . meloxicam (MOBIC) 15 MG tablet Take 15 mg by mouth daily.    . Multiple Vitamins-Minerals (OPURITY BYPASS OPTIMIZED) CHEW Chew 1 tablet daily by mouth.    Marland Kitchen omeprazole (PRILOSEC) 40 MG capsule Take 40 mg by mouth daily.    . Vitamin D, Ergocalciferol, (DRISDOL) 50000 units CAPS capsule Take 50,000 Units by mouth every 7 (seven) days.   5   Current Facility-Administered Medications on File Prior to Visit  Medication Dose Route Frequency Provider Last Rate Last Dose  . regadenoson (LEXISCAN) injection SOLN 0.4 mg  0.4 mg Intravenous Once Yvette Spark, MD        PAST MEDICAL HISTORY: Past Medical History:  Diagnosis Date  . ADD (attention  deficit disorder)   . Anemia   . Anxiety   . Arthritis   . Back pain   . Depression   . Disorder of thyroid   . DOE (dyspnea on exertion) 07/13/2016  . GERD (gastroesophageal reflux disease)   . Headache    Migraines  . Heart murmur   . Hyperthyroidism   . Joint pain   . Menstrual disorder    uterine fibriods, polyps,cysts  . Morbid obesity ([place])   . Neurocardiogenic syncope   . Palpitations   . Pre-diabetes   . Sleep apnea   . Sleep disorder    being checked for sleep spnea   . Swallowing difficulty   . Swelling     feet and legs  . Vitamin D deficiency     PAST SURGICAL HISTORY: Past Surgical History:  Procedure Laterality Date  . TILT TABLE STUDY    . URETHRAL DILATION     age 54  . WISDOM TOOTH EXTRACTION      SOCIAL HISTORY: Social History   Tobacco Use  . Smoking status: Former Smoker    Packs/day: 0.25    Years: 20.00    Pack years: 5.00    Types: Cigarettes    Last attempt to quit: 09/30/2014    Years since quitting: 2.5  . Smokeless tobacco: Never Used  Substance Use Topics  . Alcohol use: Yes    Comment: occ  . Drug use: No    FAMILY HISTORY: Family History  Problem Relation Age of Onset  . Diabetes Mother   . Heart disease Mother   . Uterine cancer Mother   . Hypertension Mother   . Hyperlipidemia Mother   . Kidney disease Mother   . Thyroid disease Mother   . Cancer Mother   . Depression Mother   . Sleep apnea Mother   . Obesity Mother   . Heart attack Father 67  . Diabetes Father   . Heart disease Father   . Hypertension Father   . Hyperlipidemia Father   . Obesity Father   . Atrial fibrillation Maternal Grandmother   . Parkinson's disease Maternal Grandmother   . Colon cancer Maternal Grandfather 49    ROS: Review of Systems  Constitutional: Positive for weight loss.    PHYSICAL EXAM: Blood pressure 118/74, pulse (!) 55, temperature 97.7 F (36.5 C), temperature source Oral, height 5\' 6"  (1.676 m), weight (!) 332 lb (150.6 kg), last menstrual period 03/23/2017, SpO2 98 %. Body mass index is 53.59 kg/m. Physical Exam  Constitutional: She is oriented to person, place, and time. She appears well-developed and well-nourished.  Cardiovascular: Normal rate.  Pulmonary/Chest: Effort normal.  Musculoskeletal: Normal range of motion.  Neurological: She is oriented to person, place, and time.  Skin: Skin is warm and dry.  Psychiatric: She has a normal mood and affect. Her behavior is normal.  Vitals reviewed.   RECENT LABS AND TESTS: BMET      Component Value Date/Time   NA 138 02/16/2017 0932   K 5.4 (H) 02/16/2017 0932   CL 103 02/16/2017 0932   CO2 27 02/16/2017 0932   GLUCOSE 112 (H) 02/16/2017 0932   BUN 19 02/16/2017 0932   CREATININE 0.99 02/20/2017 1738   CALCIUM 9.4 02/16/2017 0932   GFRNONAA >60 02/20/2017 1738   GFRAA >60 02/20/2017 1738   Lab Results  Component Value Date   HGBA1C 5.6 02/16/2017   No results found for: INSULIN CBC    Component Value Date/Time  WBC 8.9 02/22/2017 0608   RBC 4.36 02/22/2017 0608   HGB 11.4 (L) 02/22/2017 0608   HCT 35.6 (L) 02/22/2017 0608   PLT 253 02/22/2017 0608   MCV 81.7 02/22/2017 0608   MCH 26.1 02/22/2017 0608   MCHC 32.0 02/22/2017 0608   RDW 14.4 02/22/2017 0608   LYMPHSABS 1.6 02/22/2017 0608   MONOABS 0.9 02/22/2017 0608   EOSABS 0.1 02/22/2017 0608   BASOSABS 0.0 02/22/2017 0608   Iron/TIBC/Ferritin/ %Sat No results found for: IRON, TIBC, FERRITIN, IRONPCTSAT Lipid Panel  No results found for: CHOL, TRIG, HDL, CHOLHDL, VLDL, LDLCALC, LDLDIRECT Hepatic Function Panel     Component Value Date/Time   PROT 6.8 03/09/2007 0146   ALBUMIN 3.7 03/09/2007 0146   AST 25 03/09/2007 0146   ALT 30 03/09/2007 0146   ALKPHOS 100 03/09/2007 0146   BILITOT 0.5 03/09/2007 0146     ASSESSMENT AND PLAN: Gastroesophageal reflux disease without esophagitis  Other iron deficiency anemia - Plan: Iron Carbonyl-Vitamin C-FOS (CHEWABLE IRON) 30-10-25 MG CHEW  Class 3 severe obesity with serious comorbidity and body mass index (BMI) of 50.0 to 59.9 in adult, unspecified obesity type ([place])  PLAN:  GERD (Gastroesophageal Reflux Disease) Patty agrees to continue taking prilosec as prescribed and OTC Tums. Yvette White agrees to follow up with our clinic in 4 to 5 weeks.  Iron Deficiency Anemia The diagnosis of Iron deficiency anemia was discussed with Yvette White and was explained in detail. Yvette White was given suggestions of iron rich foods and she agrees start celebrate vitamins iron  30 mg qd chewables and follow up for labs as instructed by Dr. Hassell Done. Yvette White agrees to follow up with our clinic in 4 to 5 weeks.  We spent > than 50% of the 30 minute visit on the counseling as documented in the note.  Obesity Yvette White is currently in the action stage of change. As such, her goal is to continue with weight loss efforts She has agreed to Soft foods diet with 60-70 grams of protein daily Yvette White has been instructed to work up to a goal of 150 minutes of combined cardio and strengthening exercise per week for weight loss and overall health benefits. We discussed the following Behavioral Modification Strategies today: increasing lean protein intake, decreasing simple carbohydrates, decrease eating out, work on meal planning and easy cooking plans, no skipping meals, and keeping healthy foods in the home.   Yvette White has agreed to follow up with our clinic in 4 to 5 weeks. She was informed of the importance of frequent follow up visits to maximize her success with intensive lifestyle modifications for her multiple health conditions.  I, Trixie Dredge, am acting as transcriptionist for Yvette Nip, MD  I have reviewed the above documentation for accuracy and completeness, and I agree with the above. -Yvette Nip, MD      Today's visit was # 2 out of 11.  Starting weight: 383 lbs Starting date: 01/24/17 Today's weight : 332 lbs  Today's date: 04/04/2017 Total lbs lost to date: 72 (Patients must lose 7 lbs in the first 6 months to continue with counseling)   ASK: We discussed the diagnosis of obesity with Yvette White today and De agreed to give [place] permission to discuss obesity behavioral modification therapy today.  ASSESS: Yvette White has the diagnosis of obesity and her BMI today is 53.61 Yvette White is in the action stage of change   ADVISE: Yvette White was educated on the multiple health risks of obesity as well as the  benefit of weight loss to improve her health. She was advised of the  need for long term treatment and the importance of lifestyle modifications.  AGREE: Multiple dietary modification options and treatment options were discussed and  Shuntell agreed to Soft foods diet with 60-70 grams of protein daily We discussed the following Behavioral Modification Strategies today: increasing lean protein intake, decreasing simple carbohydrates, decrease eating out, work on meal planning and easy cooking plans, no skipping meals, and keeping healthy foods in the home.

## 2017-04-09 ENCOUNTER — Encounter: Payer: Self-pay | Admitting: Neurology

## 2017-04-11 ENCOUNTER — Telehealth: Payer: Self-pay | Admitting: Neurology

## 2017-04-11 DIAGNOSIS — H05121 Orbital myositis, right orbit: Secondary | ICD-10-CM | POA: Diagnosis not present

## 2017-04-11 DIAGNOSIS — H5710 Ocular pain, unspecified eye: Secondary | ICD-10-CM | POA: Diagnosis not present

## 2017-04-11 NOTE — Telephone Encounter (Signed)
I called pt and explained to her that she may need to go to UC or PCP to evaluate her right problems and may need a referral to ophthalmology. I advised her that Dr. Rexene Alberts reviewed her auto pap compliance for the past 30 days and it shows reassuring compliance and data. Pt may need an pressure adjustment but we will address that at her auto pap appt. Pt verbalized understanding and will proceed to UC.

## 2017-04-11 NOTE — Telephone Encounter (Signed)
Patient woke up this morning with right eye swollen. Her eye hurts and she is having a bad headache right over the eye that is swollen. She would like to be seen if possible. She has appointment with Dr. Rexene Alberts on 11-29.

## 2017-04-11 NOTE — Telephone Encounter (Signed)
Please encourage patient to proceed to urgent care or primary care physician. She may have a problem with her eye may need a referral to ophthalmology. I reviewed her AutoPap compliance data for the past 30 days and patient is compliant with AutoPap which is reassuring. She may need a pressure adjustment but we will address when she returns for follow-up for her sleep apnea.

## 2017-04-16 ENCOUNTER — Encounter: Payer: BLUE CROSS/BLUE SHIELD | Attending: Surgery | Admitting: Skilled Nursing Facility1

## 2017-04-16 ENCOUNTER — Encounter: Payer: Self-pay | Admitting: Skilled Nursing Facility1

## 2017-04-16 ENCOUNTER — Ambulatory Visit: Payer: Self-pay | Admitting: Registered"

## 2017-04-16 DIAGNOSIS — Z713 Dietary counseling and surveillance: Secondary | ICD-10-CM | POA: Diagnosis not present

## 2017-04-16 DIAGNOSIS — Z6841 Body Mass Index (BMI) 40.0 and over, adult: Secondary | ICD-10-CM | POA: Diagnosis not present

## 2017-04-16 DIAGNOSIS — H05121 Orbital myositis, right orbit: Secondary | ICD-10-CM | POA: Diagnosis not present

## 2017-04-16 NOTE — Progress Notes (Signed)
Follow-up visit:  8 Weeks Post-Operative Sleeve Surgery  Primary concerns today: Post-operative Bariatric Surgery Nutrition Management.  Pt states she is on prednisone. Pt states she eats her lunch over about an hour while still working. Pt states she has water bottle that blinks when she needs to drink. Pt states she has a bowel movement every three days and they are hard stools.   Surgery date: 02/20/2017 Surgery type: Sleeve gastrectomy Start weight at [facility]: 381.9 Weight today: 331 Weight change: 17 lbs loss  TANITA  BODY COMP RESULTS  03/07/2017   BMI (kg/m^2) 56.3   Fat Mass (lbs) 200.6   Fat Free Mass (lbs) 148.2   Total Body Water (lbs) 112.2    24-hr recall: 600-800 B (AM): greek yogurt with seeds ----1 egg----atkins protein shake (15g) Snk (AM):  L (PM): 3 cheddar cheese or jerky or hummus with quest protein chips 21 grams Snk (PM):  D (PM): tilapia or cod or haddock baked with I cant believe its not butter or squash with the fish or chicken from [place] with side salad or chili Snk (PM): protein shake  Fluid intake: 2 protein shakes, regular coffee: 40-60 ounces  Estimated total protein intake: 55-70  Medications: see list Supplementation: celebrate capsule and tums 3 times a day  Using straws: no Drinking while eating: no Having you been chewing well: yes Chewing/swallowing difficulties: no Changes in vision: no Changes to mood/headaches: no Hair loss/Cahnges to skin/Changes to nails: no Any difficulty focusing or concentrating: no Sweating: no Dizziness/Lightheaded:  Palpitations: no  Carbonated beverages: no N/V/D/C/GAS: a little constipated  Abdominal Pain: no Dumping syndrome: no  Recent physical activity:  ADL's  Progress Towards Goal(s):  In progress.  Handouts given during visit include:  Non-starchy veggies + protein   Nutritional Diagnosis:  [place]-3.3 Overweight/obesity related to past poor dietary habits and physical inactivity as  evidenced by patient w/ recent sleeve surgery following dietary guidelines for continued weight loss.    Intervention:  Nutrition counseling. Dietitian educated the pt on advancing her diet to include non-starchy vegetables. Goals: -Aim for 70 ounces of water -Have non-starchy vegetables with every lunch and dinner  Teaching Method Utilized:  Visual Auditory Hands on  Barriers to learning/adherence to lifestyle change: a want to add in carbohydrates   Demonstrated degree of understanding via:  Teach Back   Monitoring/Evaluation:  Dietary intake, exercise, and body weight.

## 2017-04-20 DIAGNOSIS — G4733 Obstructive sleep apnea (adult) (pediatric): Secondary | ICD-10-CM | POA: Diagnosis not present

## 2017-04-26 ENCOUNTER — Ambulatory Visit: Payer: BLUE CROSS/BLUE SHIELD | Admitting: Neurology

## 2017-04-26 ENCOUNTER — Encounter: Payer: Self-pay | Admitting: Neurology

## 2017-04-26 VITALS — BP 131/65 | HR 58 | Ht 66.0 in | Wt 323.0 lb

## 2017-04-26 DIAGNOSIS — G4733 Obstructive sleep apnea (adult) (pediatric): Secondary | ICD-10-CM | POA: Diagnosis not present

## 2017-04-26 DIAGNOSIS — Z9989 Dependence on other enabling machines and devices: Secondary | ICD-10-CM

## 2017-04-26 NOTE — Patient Instructions (Addendum)
Please continue using your autoPAP regularly. While your insurance requires that you use PAP at least 4 hours each night on 70% of the nights, I recommend, that you not skip any nights and use it throughout the night if you can. Getting used to PAP and staying with the treatment long term does take time and patience and discipline. Untreated obstructive sleep apnea when it is moderate to severe can have an adverse impact on cardiovascular health and raise her risk for heart disease, arrhythmias, hypertension, congestive heart failure, stroke and diabetes. Untreated obstructive sleep apnea causes sleep disruption, nonrestorative sleep, and sleep deprivation. This can have an impact on your day to day functioning and cause daytime sleepiness and impairment of cognitive function, memory loss, mood disturbance, and problems focussing. Using PAP regularly can improve these symptoms.  Please be mindful of stress being a trigger for autoimmune issues.   We will have you come back for a day time appointment in the sleep lab to work with Shirlean Mylar, our sleep lab manager for a mask fitting and troubleshooting with the humidity issues.

## 2017-04-26 NOTE — Progress Notes (Signed)
Subjective:    Patient ID: Yvette White is a 46 y.o. female.  HPI     Interim history:   Yvette White is a 46 year old right-handed woman with an underlying medical history of vitamin D deficiency, history of syncope, reflux disease, hypothyroidism, depression, ADHD and morbid obesity, who presents for follow-up consultation of her obstructive sleep apnea after recent home sleep testing and also for any problem of headache. The patient is unaccompanied today. I first met her on 06/27/2016 at the request of her primary care provider, at which time the patient reported snoring and excessive daytime somnolence. I suggested we proceed with sleep study testing. Her insurance denied and attended sleep study. She had a home sleep test on 09/13/2016 which showed an AHI of 6.2 per hour, average oxygen saturation of 94%, nadir of 86%. She was advised to start AutoPap therapy.  Today, 04/26/2017: I reviewed her AutoPap compliance data from 03/26/2017 through 04/24/2017 which is a total of 30 days, during which time she used her machine every night with percent used days greater than 4 hours at 97%, indicating excellent compliance with an average usage of 7 hours and 52 minutes, residual AHI 6.6 per hour, 95th percentile at 10.4 cm, leak very low with the 95th percentile at 0.1 L/m on a pressure range of 6-12 cm with EPR. She reports having difficulty with the mask. She is now using a small nasal mask but tends to pull it off. She has more difficulty with humidity setting. She tried changing the settings including the temperature and the moisture but she still has issues with condensation in the mask. She has had some residual right-sided eye pain and retro-orbital pain on the right, some headache above the right eyebrow but nowhere near what she had earlier this month. She has been started on prednisone and is on a taper. According to her bariatric surgeon she cannot be on steroids long-term for fear of side  effects including risk for ulcer and healing issues. She is on it for about a month per her ophthalmologist. Of note, she had laparoscopic bariatric surgery in September 2018. She called a few weeks ago with new onset eye swelling on the right side and eye pain and right-sided headache. She was advised to proceed to urgent care for this new problem and consider seeing ophthalmology. She saw Dr. Katy Fitch. She had a CT orbits with IV contrast on 04/11/2017 and I reviewed the results: IMPRESSION:   1.   Apparent mild retrobulbar stranding on the right especially extending about the lateral aspect of the optic nerve. 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   2.  No evidence of preseptal or postseptal collection or mass.   3.  Normal appearance of the globes and extraocular muscles.    Since she has been on the prednisone her vision and eye pain have improved. She had some double vision as well but this is improved. She was told that it could be an autoimmune process. She has had some thyroid function test through her ophthalmologist.   The patient's allergies, current medications, family history, past medical history, past social history, past surgical history and problem list were reviewed and updated as appropriate.   Previously (copied from previous notes for reference):   06/27/2016: (She) reports snoring and excessive daytime somnolence. I reviewed your office note from 03/06/2016, which you kindly included. She has a hx of syncope and has sleep studies in the remote past. Last sleep  study was about 10 years ago, per patient neg. for OSA. She sees a psychiatrist for depression. She is hoping to wean off her medication. She has been able to come off some of her medications. She needs at least 8 hours of sleep to feel rested. 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 Her mother and her maternal aunt have obstructive sleep apnea and uses CPAP machine. Patient is single, has no children, lives with mom, sister and niece. She is known to snore and also endorses waking up with a sense of gasping for air. She also has reflux symptoms at night and is supposed to see a gastroenterologist. She is also considering weight loss surgery. She quit smoking in May 2016, takes alcohol about 5 glasses a month, caffeine 1-2 cups daily. She denies any frank restless leg symptoms or leg twitching at night. She tries to make sure she gets about 8 hours of sleep on any given night. She has a history of syncope, thankfully none and about 9 or 10 years, will reestablish care with her cardiologist. She had an abnormal tilt table test in the past. She has nearly daily AM HAs, no meds for it, last about 3 hours, non migrainous, used to have migraines as a child.  She feels tired during the day, Epworth sleepiness score is 6 out of 24 today, her fatigue score is 39 out of 63.   Her Past Medical History Is Significant For: Past Medical History:  Diagnosis Date  . ADD (attention deficit disorder)   . Anemia   . Anxiety   . Arthritis   . Back pain   . Depression   . Disorder of thyroid   . DOE (dyspnea on exertion) 07/13/2016  . GERD (gastroesophageal reflux disease)   . Headache    Migraines  . Heart murmur   . Hyperthyroidism   . Joint pain   . Menstrual disorder    uterine fibriods, polyps,cysts  . Morbid obesity ([place])   . Neurocardiogenic syncope   . Palpitations   . Pre-diabetes   . Sleep apnea   . Sleep disorder    being checked for sleep spnea   . Swallowing difficulty   . Swelling    feet and legs  . Vitamin D deficiency     Her Past Surgical History Is Significant For: Past Surgical History:  Procedure Laterality Date  . DILATATION & CURETTAGE/HYSTEROSCOPY WITH MYOSURE N/A  06/08/2016   Procedure: DILATATION & CURETTAGE/HYSTEROSCOPY;  Surgeon: Louretta Shorten, MD;  Location: [facility];  Service: Gynecology;  Laterality: N/A;  . LAPAROSCOPIC GASTRIC SLEEVE RESECTION N/A 02/20/2017   Procedure: LAPAROSCOPIC GASTRIC SLEEVE RESECTION WITH UPPER ENDOSCOPY;  Surgeon: Johnathan Hausen, MD;  Location: [facility];  Service: General;  Laterality: N/A;  . TILT TABLE STUDY    . URETHRAL DILATION     age 76  . WISDOM TOOTH EXTRACTION      Her Family History Is Significant For: Family History  Problem Relation Age of Onset  . Diabetes Mother   . Heart disease Mother   . Uterine cancer Mother   . Hypertension Mother   . Hyperlipidemia Mother   . Kidney disease Mother   . Thyroid disease Mother   . Cancer Mother   . Depression Mother   . Sleep apnea Mother   . Obesity  Mother   . Heart attack Father 31  . Diabetes Father   . Heart disease Father   . Hypertension Father   . Hyperlipidemia Father   . Obesity Father   . Atrial fibrillation Maternal Grandmother   . Parkinson's disease Maternal Grandmother   . Colon cancer Maternal Grandfather 70    Her Social History Is Significant For: Social History   Socioeconomic History  . Marital status: Single    Spouse name: None  . Number of children: 0  . Years of education: BA  . Highest education level: None  Social Needs  . Financial resource strain: None  . Food insecurity - worry: None  . Food insecurity - inability: None  . Transportation needs - medical: None  . Transportation needs - non-medical: None  Occupational History  . Occupation: Freight forwarder, Geophysicist/field seismologist, Instructor  Tobacco Use  . Smoking status: Former Smoker    Packs/day: 0.25    Years: 20.00    Pack years: 5.00    Types: Cigarettes    Last attempt to quit: 09/30/2014    Years since quitting: 2.5  . Smokeless tobacco: Never Used  Substance and Sexual Activity  . Alcohol use: Yes    Comment: occ  . Drug use: No  . Sexual activity: None  Other  Topics Concern  . None  Social History Narrative   Drinks 1-2 caffeine drinks a day     Her Allergies Are:  Allergies  Allergen Reactions  . Theophylline     Other reaction(s): Other (See Comments) Hyperactive  "Slo phylinne"  . Penicillins Rash    Has patient had a PCN reaction causing immediate rash, facial/tongue/throat swelling, SOB or lightheadedness with hypotension:unsure Has patient had a PCN reaction causing severe rash involving mucus membranes or skin necrosis:unsure Has patient had a PCN reaction that required hospitalization:No Has patient had a PCN reaction occurring within the last 10 years: No If all of the above answers are "NO", then may proceed with Cephalosporin use.   . Pseudoephedrine Rash  . Sulfamethoxazole-Trimethoprim Rash  :   Her Current Medications Are:  Outpatient Encounter Medications as of 04/26/2017  Medication Sig  . acetaminophen (TYLENOL) 500 MG tablet Take 1,000 mg by mouth every 6 (six) hours as needed (for pain.).  Marland Kitchen amphetamine-dextroamphetamine (ADDERALL) 20 MG tablet Take 20 mg by mouth daily.  . Calcium Carbonate Antacid (ANTACID PO) Take 500 mg by mouth 3 (three) times daily.   . clonazePAM (KLONOPIN) 0.5 MG tablet Take 0.5-1 mg by mouth 3 (three) times daily as needed for sleep or anxiety.  . cyclobenzaprine (FLEXERIL) 10 MG tablet Take 10 mg by mouth at bedtime.  . lamoTRIgine (LAMICTAL) 200 MG tablet Take 200 mg by mouth every evening.   . loratadine (CLARITIN) 10 MG tablet Take 10 mg by mouth every evening.  . meloxicam (MOBIC) 15 MG tablet Take 15 mg by mouth daily.  . Multiple Vitamins-Minerals (CELEBRATE MULTI-COMPLETE 18 PO) Take by mouth.  . Multiple Vitamins-Minerals (OPURITY BYPASS OPTIMIZED) CHEW Chew 1 tablet daily by mouth.  Marland Kitchen omeprazole (PRILOSEC) 40 MG capsule Take 40 mg by mouth daily.  Marland Kitchen PREDNISONE PO Take by mouth.  . Vitamin D, Ergocalciferol, (DRISDOL) 50000 units CAPS capsule Take 50,000 Units by mouth every 7  (seven) days.   . Iron Carbonyl-Vitamin C-FOS (CHEWABLE IRON) 30-10-25 MG CHEW Chew 1 tablet daily at 12 noon by mouth.   Facility-Administered Encounter Medications as of 04/26/2017  Medication  . regadenoson (LEXISCAN) injection SOLN  0.4 mg  :  Review of Systems:  Out of a complete 14 point review of systems, all are reviewed and negative with the exception of these symptoms as listed below: Review of Systems  Neurological:       Pt presents today to discuss her cpap. Pt has noticed a lot of condensation in her mask which is bothersome. Pt recently had a CT that showed inflamed ocular muscles and pt is on a prednisone taper. Pt is having headaches related to the ocular inflammation.    Objective:  Neurological Exam  Physical Exam Physical Examination:   Vitals:   04/26/17 1144  BP: 131/65  Pulse: (!) 58   General Examination: The patient is a very pleasant 46 y.o. female in no acute distress. She appears well-developed and well-nourished and well groomed.   HEENT: Normocephalic, atraumatic, pupils are equal, round and reactive to light and accommodation. She wears corrective eyeglasses. Funduscopic exam shows no obvious abnormality. Extraocular tracking is good, she has no nystagmus, she denies double vision. She has no conjunctival or scleral irritation. Hearing is intact. Airway examination reveals mild mouth dryness, unchanged findings, smaller airway entry, small nasal anatomy as well. Tongue protrudes centrally and palate elevates symmetrically.   Chest: Clear to auscultation without wheezing, rhonchi or crackles noted.  Heart: S1+S2+0, regular and normal without murmurs, rubs or gallops noted.   Abdomen: Soft, non-tender and non-distended with normal bowel sounds appreciated on auscultation.  Extremities: There is no pitting edema in the distal lower extremities bilaterally. Pedal pulses are intact.  Skin: Warm and dry without trophic changes noted. There are no  varicose veins, but she has spider veins.  Musculoskeletal: exam reveals no obvious joint deformities, tenderness or joint swelling or erythema.   Neurologically:  Mental status: The patient is awake, alert and oriented in all 4 spheres. Her immediate and remote memory, attention, language skills and fund of knowledge are appropriate. There is no evidence of aphasia, agnosia, apraxia or anomia. Speech is clear with normal prosody and enunciation. Thought process is linear. Mood is normal and affect is normal.  Cranial nerves II - XII are as described above under HEENT exam.  Motor exam: Normal bulk, strength and tone is noted. There is no drift, tremor or rebound. Romberg is negative. Reflexes are 1+ in the upper extremities and trace in the lower extremities, fine motor skills and coordination: grossly intact.  Cerebellar testing: No dysmetria or intention tremor.    Sensory exam: intact to light touch.  Gait, station and balance: She stands easily. No veering to one side is noted. No leaning to one side is noted. Posture is age-appropriate and stance is narrow based. Gait shows normal stride length and normal pace. No problems turning are noted.   Assessment and Plan:  In summary, Yvette White is a very pleasant 46 year old female with an underlying medical history of vitamin D deficiency, history of syncope, reflux disease, hypothyroidism, depression, ADHD and morbid obesity, who presents for follow-up consultation of her mild OSA, now on AutoPap therapy. She is excellent with her compliance. She has had some issues with her mask and condensation through a nasal mask. She has tried to address this with her DME company. She has suggested the humidity setting and the heat on her machine. She is encouraged to continue to be fully compliant. She is commended for her treatment adherence. I suggested she make an appointment with our sleep lab manager, Shirlean Mylar, for additional troubleshooting with the  condensation  problem with her mask. The seal is very good. She has in the interim developed right eye pain and was diagnosed with inflammation of one of her eye muscles. She has been on prednisone and has improved significantly as she indicates. She is advised to follow-up with her ophthalmologist. She had a orbital CT with contrast recently which I reviewed. Physical exam is stable. She has recently undergone bariatric surgery.  From my end of things I suggested a 6 month follow-up, sooner as needed. She has a nonfocal neurological exam and is reassured. She is advised that autoimmune diseases often seem to flareup when there is stress. She is advised to work on stress reduction. I will ask our sleep lab manager Shirlean Mylar to reach out to her. I answered all her questions today and she was in agreement.  I spent 25 minutes in total face-to-face time with the patient, more than 50% of which was spent in counseling and coordination of care, reviewing test results, reviewing medication and discussing or reviewing the diagnosis of OSA, its prognosis and treatment options. Pertinent laboratory and imaging test results that were available during this visit with the patient were reviewed by me and considered in my medical decision making (see chart for details).

## 2017-04-27 ENCOUNTER — Telehealth: Payer: Self-pay

## 2017-04-27 NOTE — Telephone Encounter (Signed)
Lm to schedule a mask fitting.

## 2017-05-01 DIAGNOSIS — F411 Generalized anxiety disorder: Secondary | ICD-10-CM | POA: Diagnosis not present

## 2017-05-02 ENCOUNTER — Ambulatory Visit (INDEPENDENT_AMBULATORY_CARE_PROVIDER_SITE_OTHER): Payer: BLUE CROSS/BLUE SHIELD | Admitting: Family Medicine

## 2017-05-02 VITALS — BP 110/69 | HR 60 | Temp 98.2°F | Ht 66.0 in | Wt 316.0 lb

## 2017-05-02 DIAGNOSIS — E559 Vitamin D deficiency, unspecified: Secondary | ICD-10-CM

## 2017-05-02 DIAGNOSIS — R7303 Prediabetes: Secondary | ICD-10-CM

## 2017-05-02 DIAGNOSIS — E8881 Metabolic syndrome: Secondary | ICD-10-CM | POA: Insufficient documentation

## 2017-05-02 DIAGNOSIS — Z6841 Body Mass Index (BMI) 40.0 and over, adult: Secondary | ICD-10-CM | POA: Diagnosis not present

## 2017-05-02 MED ORDER — VITAMIN D (ERGOCALCIFEROL) 1.25 MG (50000 UNIT) PO CAPS: 50000.0000 [IU] | ORAL_CAPSULE | ORAL | 5 refills | Status: DC

## 2017-05-02 NOTE — Progress Notes (Signed)
Office: [phone]  /  Fax: [phone]   HPI:   Chief Complaint: OBESITY Yvette White is here to discuss her progress with her obesity treatment plan. She is on the soft foods diet with 60 to 70 grams of protein and is following her eating plan approximately 95 % of the time. She states she is exercising 0 minutes 0 times per week. 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 She is trying to journal and increase her H2O intake and protein. Yvette White has questions about current calorie and protein goals. Her weight is (!) 316 lb (143.3 kg) today and has had a weight loss of 16 pounds over a period of 4 weeks since her last visit. She has lost 67 lbs since starting treatment with [place].  Vitamin D deficiency Yvette White has a diagnosis of vitamin D deficiency. She is currently stable on vit D and denies nausea, vomiting or muscle weakness.  Pre-Diabetes Yvette White has a diagnosis of pre-diabetes based on her elevated Hgb A1c and was informed this puts her at greater risk of developing diabetes. Yvette White is doing well with diet and she is not taking metformin currently. She continues to work on diet and exercise to decrease risk of diabetes. She denies nausea or hypoglycemia. Yvette White needs a PCP to help monitor this and other health issues.  ALLERGIES: Allergies  Allergen Reactions  . Theophylline     Other reaction(s): Other (See Comments) Hyperactive  "Slo phylinne"  . Penicillins Rash    Has patient had a PCN reaction causing immediate rash, facial/tongue/throat swelling, SOB or lightheadedness with hypotension:unsure Has patient had a PCN reaction causing severe rash involving mucus membranes or skin necrosis:unsure Has patient had a PCN reaction that required hospitalization:No Has patient had a PCN reaction occurring within the last 10 years: No If all of the above answers are "NO", then may proceed with Cephalosporin use.   . Pseudoephedrine Rash  .  Sulfamethoxazole-Trimethoprim Rash    MEDICATIONS: Current Outpatient Medications on File Prior to Visit  Medication Sig Dispense Refill  . acetaminophen (TYLENOL) 500 MG tablet Take 1,000 mg by mouth every 6 (six) hours as needed (for pain.).    Marland Kitchen amphetamine-dextroamphetamine (ADDERALL) 20 MG tablet Take 20 mg by mouth daily.    . Calcium Carbonate Antacid (ANTACID PO) Take 500 mg by mouth 3 (three) times daily.     . clonazePAM (KLONOPIN) 0.5 MG tablet Take 0.5-1 mg by mouth 3 (three) times daily as needed for sleep or anxiety.  3  . cyclobenzaprine (FLEXERIL) 10 MG tablet Take 10 mg by mouth at bedtime.  2  . lamoTRIgine (LAMICTAL) 200 MG tablet Take 200 mg by mouth every evening.     . loratadine (CLARITIN) 10 MG tablet Take 10 mg by mouth every evening.    . Multiple Vitamins-Minerals (CELEBRATE MULTI-COMPLETE 18 PO) Take by mouth.    . Multiple Vitamins-Minerals (OPURITY BYPASS OPTIMIZED) CHEW Chew 1 tablet daily by mouth.    Marland Kitchen omeprazole (PRILOSEC) 40 MG capsule Take 40 mg by mouth daily.    Marland Kitchen PREDNISONE PO Take by mouth.    . Vitamin D, Ergocalciferol, (DRISDOL) 50000 units CAPS capsule Take 50,000 Units by mouth every 7 (seven) days.   5   Current Facility-Administered Medications on File Prior to Visit  Medication Dose Route Frequency Provider Last Rate Last Dose  . regadenoson (LEXISCAN) injection SOLN 0.4 mg  0.4 mg Intravenous Once Dorothy Spark, MD  PAST MEDICAL HISTORY: Past Medical History:  Diagnosis Date  . ADD (attention deficit disorder)   . Anemia   . Anxiety   . Arthritis   . Back pain   . Depression   . Disorder of thyroid   . DOE (dyspnea on exertion) 07/13/2016  . GERD (gastroesophageal reflux disease)   . Headache    Migraines  . Heart murmur   . Hyperthyroidism   . Joint pain   . Menstrual disorder    uterine fibriods, polyps,cysts  . Morbid obesity ([place])   . Neurocardiogenic syncope   . Palpitations   . Pre-diabetes   . Sleep apnea    . Sleep disorder    being checked for sleep spnea   . Swallowing difficulty   . Swelling    feet and legs  . Vitamin D deficiency     PAST SURGICAL HISTORY: Past Surgical History:  Procedure Laterality Date  . DILATATION & CURETTAGE/HYSTEROSCOPY WITH MYOSURE N/A 06/08/2016   Procedure: DILATATION & CURETTAGE/HYSTEROSCOPY;  Surgeon: Louretta Shorten, MD;  Location: [facility];  Service: Gynecology;  Laterality: N/A;  . LAPAROSCOPIC GASTRIC SLEEVE RESECTION N/A 02/20/2017   Procedure: LAPAROSCOPIC GASTRIC SLEEVE RESECTION WITH UPPER ENDOSCOPY;  Surgeon: Johnathan Hausen, MD;  Location: [facility];  Service: General;  Laterality: N/A;  . TILT TABLE STUDY    . URETHRAL DILATION     age 46  . WISDOM TOOTH EXTRACTION      SOCIAL HISTORY: Social History   Tobacco Use  . Smoking status: Former Smoker    Packs/day: 0.25    Years: 20.00    Pack years: 5.00    Types: Cigarettes    Last attempt to quit: 09/30/2014    Years since quitting: 2.5  . Smokeless tobacco: Never Used  Substance Use Topics  . Alcohol use: Yes    Comment: occ  . Drug use: No    FAMILY HISTORY: Family History  Problem Relation Age of Onset  . Diabetes Mother   . Heart disease Mother   . Uterine cancer Mother   . Hypertension Mother   . Hyperlipidemia Mother   . Kidney disease Mother   . Thyroid disease Mother   . Cancer Mother   . Depression Mother   . Sleep apnea Mother   . Obesity Mother   . Heart attack Father 75  . Diabetes Father   . Heart disease Father   . Hypertension Father   . Hyperlipidemia Father   . Obesity Father   . Atrial fibrillation Maternal Grandmother   . Parkinson's disease Maternal Grandmother   . Colon cancer Maternal Grandfather 11    ROS: Review of Systems  Constitutional: Positive for weight loss.  Gastrointestinal: Negative for nausea and vomiting.  Musculoskeletal:       Negative muscle weakness  Endo/Heme/Allergies:       Negative hypoglycemia    PHYSICAL EXAM: Blood  pressure 110/69, pulse 60, temperature 98.2 F (36.8 C), temperature source Oral, height 5\' 6"  (1.676 m), weight (!) 316 lb (143.3 kg), last menstrual period 03/23/2017, SpO2 99 %. Body mass index is 51 kg/m. Physical Exam  Constitutional: She is oriented to person, place, and time. She appears well-developed and well-nourished.  Cardiovascular: Normal rate.  Pulmonary/Chest: Effort normal.  Musculoskeletal: Normal range of motion.  Neurological: She is oriented to person, place, and time.  Skin: Skin is warm and dry.  Psychiatric: She has a normal mood and affect. Her behavior is normal.  Vitals reviewed.  RECENT LABS AND TESTS: BMET    Component Value Date/Time   NA 138 02/16/2017 0932   K 5.4 (H) 02/16/2017 0932   CL 103 02/16/2017 0932   CO2 27 02/16/2017 0932   GLUCOSE 112 (H) 02/16/2017 0932   BUN 19 02/16/2017 0932   CREATININE 0.99 02/20/2017 1738   CALCIUM 9.4 02/16/2017 0932   GFRNONAA >60 02/20/2017 1738   GFRAA >60 02/20/2017 1738   Lab Results  Component Value Date   HGBA1C 5.6 02/16/2017   No results found for: INSULIN CBC    Component Value Date/Time   WBC 8.9 02/22/2017 0608   RBC 4.36 02/22/2017 0608   HGB 11.4 (L) 02/22/2017 0608   HCT 35.6 (L) 02/22/2017 0608   PLT 253 02/22/2017 0608   MCV 81.7 02/22/2017 0608   MCH 26.1 02/22/2017 0608   MCHC 32.0 02/22/2017 0608   RDW 14.4 02/22/2017 0608   LYMPHSABS 1.6 02/22/2017 0608   MONOABS 0.9 02/22/2017 0608   EOSABS 0.1 02/22/2017 0608   BASOSABS 0.0 02/22/2017 0608   Iron/TIBC/Ferritin/ %Sat No results found for: IRON, TIBC, FERRITIN, IRONPCTSAT Lipid Panel  No results found for: CHOL, TRIG, HDL, CHOLHDL, VLDL, LDLCALC, LDLDIRECT Hepatic Function Panel     Component Value Date/Time   PROT 6.8 03/09/2007 0146   ALBUMIN 3.7 03/09/2007 0146   AST 25 03/09/2007 0146   ALT 30 03/09/2007 0146   ALKPHOS 100 03/09/2007 0146   BILITOT 0.5 03/09/2007 0146     ASSESSMENT AND PLAN: Vitamin D  deficiency - Plan: Vitamin D, Ergocalciferol, (DRISDOL) 50000 units CAPS capsule  Prediabetes - Plan: Ambulatory referral to Family Practice  Class 3 severe obesity with serious comorbidity and body mass index (BMI) of 50.0 to 59.9 in adult, unspecified obesity type ([place])  PLAN:  Vitamin D Deficiency Yvette White was informed that low vitamin D levels contributes to fatigue and are associated with obesity, breast, and colon cancer. She agrees to continue to take prescription Vit D @50 ,000 IU every week #4 with no refills and will follow up for routine testing of vitamin D, at least 2-3 times per year. She was informed of the risk of over-replacement of vitamin D and agrees to not increase her dose unless he discusses this with [place] first.  Pre-Diabetes Yvette White will continue to work on weight loss, exercise, and decreasing simple carbohydrates in her diet to help decrease the risk of diabetes. She was informed that eating too many simple carbohydrates or too many calories at one sitting increases the likelihood of GI side effects. We will refer to Dr. Briscoe Deutscher as PCP and Yvette White agreed to follow up with [place] as directed to monitor her progress.  Obesity Yvette White is currently in the action stage of change. As such, her goal is to continue with weight loss efforts She has agreed to keep a food journal with 800 to 1000 calories and 70+ grams of protein daily Yvette White has been instructed to work up to a goal of 150 minutes of combined cardio and strengthening exercise per week for weight loss and overall health benefits. We discussed the following Behavioral Modification Strategies today: increase H2O intake, no skipping meals, increasing lean protein intake, dealing with family or coworker sabotage and holiday eating strategies   Yvette White has agreed to follow up with our clinic in 6 weeks. She was informed of the importance of frequent follow up visits to maximize her success with intensive lifestyle modifications for her  multiple health conditions.  Yvette White,  am acting as transcriptionist for Yvette Nip, MD  I have reviewed the above documentation for accuracy and completeness, and I agree with the above. -Yvette Nip, MD    OBESITY BEHAVIORAL INTERVENTION VISIT  Today's visit was # 3 out of 22.  Starting weight: 383 lbs Starting date: 01/24/17 Today's weight : 316 lbs Today's date: 05/02/2017 Total lbs lost to date: 22 (Patients must lose 7 lbs in the first 6 months to continue with counseling)   ASK: We discussed the diagnosis of obesity with Yvette White today and Yvette White agreed to give [place] permission to discuss obesity behavioral modification therapy today.  ASSESS: Yvette White has the diagnosis of obesity and her BMI today is 51.03 Yvette White is in the action stage of change   ADVISE: Yvette White was educated on the multiple health risks of obesity as well as the benefit of weight loss to improve her health. She was advised of the need for long term treatment and the importance of lifestyle modifications.  AGREE: Multiple dietary modification options and treatment options were discussed and  Yvette White agreed to keep a food journal with 800 to 1000 calories and 70+ grams of protein daily We discussed the following Behavioral Modification Strategies today: increase H2O intake, no skipping meals,  increasing lean protein intake, dealing with family or coworker sabotage and holiday eating strategies

## 2017-05-04 DIAGNOSIS — H05121 Orbital myositis, right orbit: Secondary | ICD-10-CM | POA: Diagnosis not present

## 2017-05-07 ENCOUNTER — Ambulatory Visit: Payer: Self-pay | Admitting: Neurology

## 2017-05-14 DIAGNOSIS — F4320 Adjustment disorder, unspecified: Secondary | ICD-10-CM | POA: Diagnosis not present

## 2017-05-17 ENCOUNTER — Encounter (INDEPENDENT_AMBULATORY_CARE_PROVIDER_SITE_OTHER): Payer: Self-pay

## 2017-05-20 DIAGNOSIS — G4733 Obstructive sleep apnea (adult) (pediatric): Secondary | ICD-10-CM | POA: Diagnosis not present

## 2017-05-25 DIAGNOSIS — H05121 Orbital myositis, right orbit: Secondary | ICD-10-CM | POA: Diagnosis not present

## 2017-05-25 DIAGNOSIS — F4320 Adjustment disorder, unspecified: Secondary | ICD-10-CM | POA: Diagnosis not present

## 2017-05-31 DIAGNOSIS — M79675 Pain in left toe(s): Secondary | ICD-10-CM | POA: Diagnosis not present

## 2017-05-31 DIAGNOSIS — L03032 Cellulitis of left toe: Secondary | ICD-10-CM | POA: Diagnosis not present

## 2017-06-05 ENCOUNTER — Ambulatory Visit: Payer: Self-pay | Admitting: Skilled Nursing Facility1

## 2017-06-13 ENCOUNTER — Ambulatory Visit (INDEPENDENT_AMBULATORY_CARE_PROVIDER_SITE_OTHER): Payer: Self-pay | Admitting: Family Medicine

## 2017-06-14 ENCOUNTER — Encounter: Payer: BLUE CROSS/BLUE SHIELD | Attending: Surgery | Admitting: Skilled Nursing Facility1

## 2017-06-14 ENCOUNTER — Encounter: Payer: Self-pay | Admitting: Skilled Nursing Facility1

## 2017-06-14 DIAGNOSIS — Z6841 Body Mass Index (BMI) 40.0 and over, adult: Secondary | ICD-10-CM | POA: Insufficient documentation

## 2017-06-14 DIAGNOSIS — Z713 Dietary counseling and surveillance: Secondary | ICD-10-CM | POA: Insufficient documentation

## 2017-06-14 NOTE — Progress Notes (Signed)
Post-Operative Sleeve Surgery  Primary concerns today: Post-operative Bariatric Surgery Nutrition Management.  Pt states she does not want to get weighed. Pt states adding in vegetables went well. Pt states her biggest challenge is eating more. Pt states she feels she eats too fast. Pt states she eats what she is supposed to eat-then states she has been eating fruit and drinking fruit drink. Pt states she may be eating due tot stress and is scared to eat and just wants to go back to drinking shakes. Pt states she has been looking for a therapist.   Surgery date: 02/20/2017 Surgery type: Sleeve gastrectomy [facility] weight at [facility]: 381.9 Weight today:  Weight change: 17 lbs loss  TANITA  BODY COMP RESULTS  03/07/2017   BMI (kg/m^2) 56.3   Fat Mass (lbs) 200.6   Fat Free Mass (lbs) 148.2   Total Body Water (lbs) 112.2    24-hr recall: 600-800: splitting up lunch to take multivitamin  B (AM): coffee and yogurt with fruit puree or jimmy dean egg white Snk (AM): half protein bar L (PM): 2 eggs with 1 oz cheese pepperjack or cheddar or cottage cheese or tuna creations or chicken creation with cheese or protein bar or taco salad with cheese and guacmole with pico and sour cream Snk (PM):  D (PM): tilapia or cod or haddock baked with I cant believe its not butter or squash with the fish or chicken from [place] with side salad or chili Snk (PM): sugar free pudding or halo cup ice cream or protein shake to take prednisone   Fluid intake: 1/2 protein shakes, regular coffee: water: 50 ounces, juice:lemon honey and water or apple and strawberry or cranberry and apple Estimated total protein intake: 60-70  Medications: see list Supplementation: celebrate capsule and tums 3 times a day  Using straws: no Drinking while eating: no Having you been chewing well: yes Chewing/swallowing difficulties: no Changes in vision: no Changes to mood/headaches: no Hair loss/Cahnges to skin/Changes to nails:  no Any difficulty focusing or concentrating: no Sweating: no Dizziness/Lightheaded:  Palpitations: no  Carbonated beverages: no N/V/D/C/GAS: a little constipated better if taking miralax 2 times every other week Abdominal Pain: no Dumping syndrome: no  Recent physical activity:  ADL's  Progress Towards Goal(s):  In progress.    Nutritional Diagnosis:  [place]-3.3 Overweight/obesity related to past poor dietary habits and physical inactivity as evidenced by patient w/ recent sleeve surgery following dietary guidelines for continued weight loss.    Intervention:  Nutrition counseling. Dietitian educated the pt on advancing her diet to include non-starchy vegetables. Goals: -Work on finishing 2 of your bottles of water [email or web address]  And was given counseling sheet -9 grams or less of fat and sugar for all your snacks  -Be sure to have vegetables with every lunch and every dinner every day  Teaching Method Utilized:  Visual Auditory Hands on  Barriers to learning/adherence to lifestyle change: a want to add in carbohydrates   Demonstrated degree of understanding via:  Teach Back   Monitoring/Evaluation:  Dietary intake, exercise, and body weight.

## 2017-06-14 NOTE — Patient Instructions (Addendum)
-  Work on [employer] 2 of your bottles of water  [email or web address]   -9 grams or less of fat and sugar for all your snacks   -Be sure to have vegetables with every lunch and every dinner every day

## 2017-06-15 DIAGNOSIS — F4323 Adjustment disorder with mixed anxiety and depressed mood: Secondary | ICD-10-CM | POA: Diagnosis not present

## 2017-06-20 DIAGNOSIS — G4733 Obstructive sleep apnea (adult) (pediatric): Secondary | ICD-10-CM | POA: Diagnosis not present

## 2017-06-20 DIAGNOSIS — F4323 Adjustment disorder with mixed anxiety and depressed mood: Secondary | ICD-10-CM | POA: Diagnosis not present

## 2017-06-27 ENCOUNTER — Encounter (INDEPENDENT_AMBULATORY_CARE_PROVIDER_SITE_OTHER): Payer: Self-pay

## 2017-06-27 ENCOUNTER — Ambulatory Visit (INDEPENDENT_AMBULATORY_CARE_PROVIDER_SITE_OTHER): Payer: Self-pay | Admitting: Family Medicine

## 2017-06-28 DIAGNOSIS — F4323 Adjustment disorder with mixed anxiety and depressed mood: Secondary | ICD-10-CM | POA: Diagnosis not present

## 2017-07-04 DIAGNOSIS — F4323 Adjustment disorder with mixed anxiety and depressed mood: Secondary | ICD-10-CM | POA: Diagnosis not present

## 2017-07-05 DIAGNOSIS — F4323 Adjustment disorder with mixed anxiety and depressed mood: Secondary | ICD-10-CM | POA: Diagnosis not present

## 2017-07-06 DIAGNOSIS — H05121 Orbital myositis, right orbit: Secondary | ICD-10-CM | POA: Diagnosis not present

## 2017-07-11 DIAGNOSIS — F4323 Adjustment disorder with mixed anxiety and depressed mood: Secondary | ICD-10-CM | POA: Diagnosis not present

## 2017-07-12 DIAGNOSIS — F410 Panic disorder [episodic paroxysmal anxiety] without agoraphobia: Secondary | ICD-10-CM | POA: Diagnosis not present

## 2017-07-12 DIAGNOSIS — F331 Major depressive disorder, recurrent, moderate: Secondary | ICD-10-CM | POA: Diagnosis not present

## 2017-07-12 DIAGNOSIS — F4323 Adjustment disorder with mixed anxiety and depressed mood: Secondary | ICD-10-CM | POA: Diagnosis not present

## 2017-07-12 DIAGNOSIS — F900 Attention-deficit hyperactivity disorder, predominantly inattentive type: Secondary | ICD-10-CM | POA: Diagnosis not present

## 2017-07-19 DIAGNOSIS — F4323 Adjustment disorder with mixed anxiety and depressed mood: Secondary | ICD-10-CM | POA: Diagnosis not present

## 2017-07-23 DIAGNOSIS — Z713 Dietary counseling and surveillance: Secondary | ICD-10-CM | POA: Diagnosis not present

## 2017-07-24 DIAGNOSIS — F4323 Adjustment disorder with mixed anxiety and depressed mood: Secondary | ICD-10-CM | POA: Diagnosis not present

## 2017-07-24 DIAGNOSIS — H05121 Orbital myositis, right orbit: Secondary | ICD-10-CM | POA: Diagnosis not present

## 2017-07-25 ENCOUNTER — Ambulatory Visit: Payer: BLUE CROSS/BLUE SHIELD | Admitting: Family Medicine

## 2017-07-25 ENCOUNTER — Encounter: Payer: Self-pay | Admitting: Family Medicine

## 2017-07-25 VITALS — BP 118/74 | HR 70 | Temp 98.7°F | Wt 290.8 lb

## 2017-07-25 DIAGNOSIS — N3289 Other specified disorders of bladder: Secondary | ICD-10-CM | POA: Diagnosis not present

## 2017-07-25 DIAGNOSIS — E88819 Insulin resistance, unspecified: Secondary | ICD-10-CM

## 2017-07-25 DIAGNOSIS — Z87891 Personal history of nicotine dependence: Secondary | ICD-10-CM | POA: Diagnosis not present

## 2017-07-25 DIAGNOSIS — G4733 Obstructive sleep apnea (adult) (pediatric): Secondary | ICD-10-CM | POA: Diagnosis not present

## 2017-07-25 DIAGNOSIS — G43009 Migraine without aura, not intractable, without status migrainosus: Secondary | ICD-10-CM | POA: Diagnosis not present

## 2017-07-25 DIAGNOSIS — H5711 Ocular pain, right eye: Secondary | ICD-10-CM | POA: Diagnosis not present

## 2017-07-25 DIAGNOSIS — Z9884 Bariatric surgery status: Secondary | ICD-10-CM | POA: Diagnosis not present

## 2017-07-25 DIAGNOSIS — E8881 Metabolic syndrome: Secondary | ICD-10-CM | POA: Diagnosis not present

## 2017-07-25 DIAGNOSIS — E786 Lipoprotein deficiency: Secondary | ICD-10-CM

## 2017-07-25 DIAGNOSIS — E559 Vitamin D deficiency, unspecified: Secondary | ICD-10-CM | POA: Diagnosis not present

## 2017-07-25 DIAGNOSIS — Z9989 Dependence on other enabling machines and devices: Secondary | ICD-10-CM

## 2017-07-25 DIAGNOSIS — F900 Attention-deficit hyperactivity disorder, predominantly inattentive type: Secondary | ICD-10-CM

## 2017-07-25 DIAGNOSIS — Z79899 Other long term (current) drug therapy: Secondary | ICD-10-CM

## 2017-07-25 DIAGNOSIS — Z87898 Personal history of other specified conditions: Secondary | ICD-10-CM

## 2017-07-25 DIAGNOSIS — E079 Disorder of thyroid, unspecified: Secondary | ICD-10-CM

## 2017-07-25 LAB — COMPREHENSIVE METABOLIC PANEL
ALT: 14 U/L (ref 0–35)
AST: 15 U/L (ref 0–37)
Albumin: 3.9 g/dL (ref 3.5–5.2)
Alkaline Phosphatase: 92 U/L (ref 39–117)
BUN: 14 mg/dL (ref 6–23)
CO2: 31 mEq/L (ref 19–32)
Calcium: 9.6 mg/dL (ref 8.4–10.5)
Chloride: 101 mEq/L (ref 96–112)
Creatinine, Ser: 0.76 mg/dL (ref 0.40–1.20)
GFR: 87.43 mL/min (ref >60.00)
Glucose, Bld: 91 mg/dL (ref 70–99)
Potassium: 4.3 mEq/L (ref 3.5–5.1)
Sodium: 139 mEq/L (ref 135–145)
Total Bilirubin: 0.8 mg/dL (ref 0.2–1.2)
Total Protein: 6.7 g/dL (ref 6.0–8.3)

## 2017-07-25 LAB — VITAMIN B12: Vitamin B-12: 569 pg/mL (ref 211–911)

## 2017-07-25 LAB — CBC WITH DIFFERENTIAL/PLATELET
Basophils Absolute: 0 10*3/uL (ref 0.0–0.1)
Basophils Relative: 0.6 % (ref 0.0–3.0)
Eosinophils Absolute: 0.2 10*3/uL (ref 0.0–0.7)
Eosinophils Relative: 2.7 % (ref 0.0–5.0)
HCT: 40.9 % (ref 36.0–46.0)
Hemoglobin: 13.6 g/dL (ref 12.0–15.0)
Lymphocytes Relative: 18.4 % (ref 12.0–46.0)
Lymphs Abs: 1.4 10*3/uL (ref 0.7–4.0)
MCHC: 33.2 g/dL (ref 30.0–36.0)
MCV: 86.9 fl (ref 78.0–100.0)
Monocytes Absolute: 0.4 10*3/uL (ref 0.1–1.0)
Monocytes Relative: 5.4 % (ref 3.0–12.0)
Neutro Abs: 5.7 10*3/uL (ref 1.4–7.7)
Neutrophils Relative %: 72.9 % (ref 43.0–77.0)
Platelets: 283 10*3/uL (ref 150.0–400.0)
RBC: 4.7 Mil/uL (ref 3.87–5.11)
RDW: 13.6 % (ref 11.5–15.5)
WBC: 7.9 10*3/uL (ref 4.0–10.5)

## 2017-07-25 LAB — LIPID PANEL
Cholesterol: 137 mg/dL (ref 0–200)
HDL: 38.2 mg/dL — ABNORMAL LOW (ref >39.00)
LDL Cholesterol: 85 mg/dL (ref 0–99)
NonHDL: 98.52
Total CHOL/HDL Ratio: 4
Triglycerides: 68 mg/dL (ref 0.0–149.0)
VLDL: 13.6 mg/dL (ref 0.0–40.0)

## 2017-07-25 LAB — HEMOGLOBIN A1C: Hgb A1c MFr Bld: 5.5 % (ref 4.6–6.5)

## 2017-07-25 LAB — VITAMIN D 25 HYDROXY (VIT D DEFICIENCY, FRACTURES): VITD: 40.06 ng/mL (ref 30.00–100.00)

## 2017-07-25 LAB — TSH: TSH: 0.37 u[IU]/mL (ref 0.35–4.50)

## 2017-07-25 NOTE — Progress Notes (Signed)
Yvette White is a 47 y.o. female is here to [facility].   Patient Care Team: Briscoe Deutscher, DO as PCP - General (Family Medicine)   History of Present Illness:   Lonell Grandchild, CMA acting as scribe for Dr. Briscoe Deutscher.   HPI: See Assessment and Plan section for Problem Based Charting of issues discussed today.  Patient in office today to establish care. She would like to have labs drawn today.   There are no preventive care reminders to display for this patient.   Depression screen PHQ 2/9 01/24/2017  Decreased Interest 2  Down, Depressed, Hopeless 3  PHQ - 2 Score 5  Altered sleeping 3  Tired, decreased energy 3  Change in appetite 2  Feeling bad or failure about yourself  2  Trouble concentrating 1  Moving slowly or fidgety/restless 3  Suicidal thoughts 1  PHQ-9 Score 20  Difficult doing work/chores Very difficult   PMHx, SurgHx, SocialHx, Medications, and Allergies were reviewed in the Visit Navigator and updated as appropriate.   Past Medical History:  Diagnosis Date  . ADD (attention deficit disorder)   . Anemia   . Anxiety   . Arthritis   . Back pain   . Chicken pox   . Depression   . Disorder of thyroid   . DOE (dyspnea on exertion) 07/13/2016  . GERD (gastroesophageal reflux disease)   . Headache    Migraines  . Heart murmur   . Hyperthyroidism   . Joint pain   . Menstrual disorder    uterine fibriods, polyps,cysts  . Morbid obesity ([place])   . Neurocardiogenic syncope   . Palpitations   . Pre-diabetes   . Sleep apnea   . Sleep disorder    being checked for sleep spnea   . Swallowing difficulty   . Swelling    feet and legs  . Vitamin D deficiency     Past Surgical History:  Procedure Laterality Date  . DILATATION & CURETTAGE/HYSTEROSCOPY WITH MYOSURE N/A 06/08/2016   Procedure: DILATATION & CURETTAGE/HYSTEROSCOPY;  Surgeon: Louretta Shorten, MD;  Location: [facility];  Service: Gynecology;  Laterality: N/A;  . LAPAROSCOPIC GASTRIC SLEEVE  RESECTION N/A 02/20/2017   Procedure: LAPAROSCOPIC GASTRIC SLEEVE RESECTION WITH UPPER ENDOSCOPY;  Surgeon: Johnathan Hausen, MD;  Location: [facility];  Service: General;  Laterality: N/A;  . TILT TABLE STUDY    . URETHRAL DILATION     age 71  . WISDOM TOOTH EXTRACTION      Family History  Problem Relation Age of Onset  . Diabetes Mother   . Heart disease Mother   . Uterine cancer Mother   . Hypertension Mother   . Hyperlipidemia Mother   . Kidney disease Mother   . Thyroid disease Mother   . Cancer Mother   . Depression Mother   . Sleep apnea Mother   . Obesity Mother   . Heart attack Father 16  . Diabetes Father   . Heart disease Father   . Hypertension Father   . Hyperlipidemia Father   . Obesity Father   . Atrial fibrillation Maternal Grandmother   . Parkinson's disease Maternal Grandmother   . Colon cancer Maternal Grandfather 52  . Heart disease Paternal Grandmother   . Heart disease Paternal Grandfather    Social History   Tobacco Use  . Smoking status: Former Smoker    Packs/day: 0.25    Years: 20.00    Pack years: 5.00    Types: Cigarettes  Last attempt to quit: 09/30/2014    Years since quitting: 2.8  . Smokeless tobacco: Never Used  Substance Use Topics  . Alcohol use: Yes    Comment: occ  . Drug use: No   Current Medications and Allergies:   .  acetaminophen (TYLENOL) 500 MG tablet, Take 1,000 mg by mouth every 6 (six) hours as needed (for pain.)., Disp: , Rfl:  .  amphetamine-dextroamphetamine (ADDERALL) 20 MG tablet, Take 20 mg by mouth daily., Disp: , Rfl:  .  Biotin 1000 MCG tablet, Take 1,000 mcg by mouth 2 (two) times daily., Disp: , Rfl:  .  Calcium Carbonate Antacid (ANTACID PO), Take 500 mg by mouth 3 (three) times daily. , Disp: , Rfl:  .  clonazePAM (KLONOPIN) 0.5 MG tablet, Take 0.5-1 mg by mouth 3 (three) times daily as needed for sleep or anxiety., Disp: , Rfl: 3 .  cyclobenzaprine (FLEXERIL) 10 MG tablet, Take 10 mg by mouth at bedtime.,  Disp: , Rfl: 2 .  lamoTRIgine (LAMICTAL) 200 MG tablet, Take 200 mg by mouth every evening. , Disp: , Rfl:  .  loratadine (CLARITIN) 10 MG tablet, Take 10 mg by mouth every evening., Disp: , Rfl:  .  Multiple Vitamins-Minerals (CELEBRATE MULTI-COMPLETE 18 PO), Take by mouth., Disp: , Rfl:  .  omeprazole (PRILOSEC) 40 MG capsule, Take 40 mg by mouth daily., Disp: , Rfl:  .  PREDNISONE PO, Take by mouth., Disp: , Rfl:  .  Vitamin D, Ergocalciferol, (DRISDOL) 50000 units CAPS capsule, Take 1 capsule (50,000 Units total) by mouth every 7 (seven) days., Disp: 30 capsule, Rfl: 5  Allergies  Allergen Reactions  . Theophylline     Other reaction(s): Other (See Comments) Hyperactive  "Slo phylinne"  . Penicillins Rash    Has patient had a PCN reaction causing immediate rash, facial/tongue/throat swelling, SOB or lightheadedness with hypotension:unsure Has patient had a PCN reaction causing severe rash involving mucus membranes or skin necrosis:unsure Has patient had a PCN reaction that required hospitalization:No Has patient had a PCN reaction occurring within the last 10 years: No If all of the above answers are "NO", then may proceed with Cephalosporin use.   . Pseudoephedrine Rash  . Sulfamethoxazole-Trimethoprim Rash   Review of Systems:   Pertinent items are noted in the HPI. Otherwise, ROS is negative.  Vitals:   Vitals:   07/25/17 0859  BP: 118/74  Pulse: 70  Temp: 98.7 F (37.1 C)  TempSrc: Oral  SpO2: 96%  Weight: 290 lb 12.8 oz (131.9 kg)     Body mass index is 46.94 kg/m.  Physical Exam:   Physical Exam  Constitutional: She is oriented to person, place, and time. She appears well-developed and well-nourished. No distress.  HENT:  Head: Normocephalic and atraumatic.  Right Ear: External ear normal.  Left Ear: External ear normal.  Nose: Nose normal.  Mouth/Throat: Oropharynx is clear and moist.  Eyes: Conjunctivae and EOM are normal. Pupils are equal, round, and  reactive to light.  Neck: Normal range of motion. Neck supple. No thyromegaly present.  Cardiovascular: Normal rate, regular rhythm, normal heart sounds and intact distal pulses.  Pulmonary/Chest: Effort normal and breath sounds normal.  Abdominal: Soft. Bowel sounds are normal.  Musculoskeletal: Normal range of motion.  Lymphadenopathy:    She has no cervical adenopathy.  Neurological: She is alert and oriented to person, place, and time.  Skin: Skin is warm and dry. Capillary refill takes less than 2 seconds.  Psychiatric: She has  a normal mood and affect. Her behavior is normal.  Nursing note and vitals reviewed.  Results for orders placed or performed in visit on 07/25/17  HIV antibody  Result Value Ref Range   HIV 1&2 Ab, 4th Generation NON-REACTIVE NON-REACTI  Lipid panel  Result Value Ref Range   Cholesterol 137 0 - 200 mg/dL   Triglycerides 68.0 0.0 - 149.0 mg/dL   HDL 38.20 (L) >39.00 mg/dL   VLDL 13.6 0.0 - 40.0 mg/dL   LDL Cholesterol 85 0 - 99 mg/dL   Total CHOL/HDL Ratio 4    NonHDL 98.52   Vitamin B12  Result Value Ref Range   Vitamin B-12 569 211 - 911 pg/mL  CBC with Differential/Platelet  Result Value Ref Range   WBC 7.9 4.0 - 10.5 K/uL   RBC 4.70 3.87 - 5.11 Mil/uL   Hemoglobin 13.6 12.0 - 15.0 g/dL   HCT 40.9 36.0 - 46.0 %   MCV 86.9 78.0 - 100.0 fl   MCHC 33.2 30.0 - 36.0 g/dL   RDW 13.6 11.5 - 15.5 %   Platelets 283.0 150.0 - 400.0 K/uL   Neutrophils Relative % 72.9 43.0 - 77.0 %   Lymphocytes Relative 18.4 12.0 - 46.0 %   Monocytes Relative 5.4 3.0 - 12.0 %   Eosinophils Relative 2.7 0.0 - 5.0 %   Basophils Relative 0.6 0.0 - 3.0 %   Neutro Abs 5.7 1.4 - 7.7 K/uL   Lymphs Abs 1.4 0.7 - 4.0 K/uL   Monocytes Absolute 0.4 0.1 - 1.0 K/uL   Eosinophils Absolute 0.2 0.0 - 0.7 K/uL   Basophils Absolute 0.0 0.0 - 0.1 K/uL  Comprehensive metabolic panel  Result Value Ref Range   Sodium 139 135 - 145 mEq/L   Potassium 4.3 3.5 - 5.1 mEq/L   Chloride 101  96 - 112 mEq/L   CO2 31 19 - 32 mEq/L   Glucose, Bld 91 70 - 99 mg/dL   BUN 14 6 - 23 mg/dL   Creatinine, Ser 0.76 0.40 - 1.20 mg/dL   Total Bilirubin 0.8 0.2 - 1.2 mg/dL   Alkaline Phosphatase 92 39 - 117 U/L   AST 15 0 - 37 U/L   ALT 14 0 - 35 U/L   Total Protein 6.7 6.0 - 8.3 g/dL   Albumin 3.9 3.5 - 5.2 g/dL   Calcium 9.6 8.4 - 10.5 mg/dL   GFR 87.43 >60.00 mL/min  Hemoglobin A1c  Result Value Ref Range   Hgb A1c MFr Bld 5.5 4.6 - 6.5 %  VITAMIN D 25 Hydroxy (Vit-D Deficiency, Fractures)  Result Value Ref Range   VITD 40.06 30.00 - 100.00 ng/mL  TSH  Result Value Ref Range   TSH 0.37 0.35 - 4.50 uIU/mL   Assessment and Plan:   Current Problem List updated with notes today.   Patient Active Problem List   Diagnosis Date Noted  . Former smoker, quit in 2016 07/29/2017  . Low HDL (under 40) 07/29/2017  . Bladder spasms 07/29/2017  . History of abnormal mammogram 07/29/2017  . Ocular pain, right eye 07/29/2017  . Thyroid condition, history of low TSH that normalized with monitoring 07/29/2017  . High risk medication use - PREDNISONE x 3, s/p recent gastric sleeve 07/29/2017  . ADD (attention deficit disorder)   . OSA on CPAP   . Migraine   . Vitamin D deficiency, on 50K IU weekly 05/02/2017  . Insulin resistance, A1c 5.5 on 07/25/17 05/02/2017  . 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 Bipolar 1 disorder ([place]), followed by Psych, controlled with Lamictal 01/24/2017  . Morbid obesity ([place]), s/p gastric sleeve, at time of surgery - weight 382, BMI 61 07/13/2016  . Abnormal menses 03/06/2016  . GERD (gastroesophageal reflux disease), on Prilosec 03/06/2016  . History of neurocardiogenic syncope, no episodes since 2017 03/06/2016  . Pharyngoesophageal dysphagia 03/06/2016  . Seasonal allergies, controlled with Claritin 08/25/2013   Problem  Former smoker, quit in 2016  [place] (Under 40)   Lab Results  Component Value Date   CHOL 137 07/25/2017    HDL 38.20 (L) 07/25/2017   LDLCALC 85 07/25/2017   TRIG 68.0 07/25/2017   CHOLHDL 4 07/25/2017     Bladder Spasms  History of Abnormal Mammogram  Ocular Pain, Right Eye   Acute Interface, Incoming Rad Results - 04/11/2017  4:46 PM EST INDICATION: Ocular pain  TECHNIQUE: CT ORBITS W IV CONTRAST; Axial CT images were obtained with sagittal and coronal multiplanar reformats.  Contrast - After administration of IV contrast.,  80 mL Isovue-370 IV.  IMPRESSION: 1.  Apparent mild retrobulbar stranding on the right especially extending about the lateral aspect of the optic nerve. 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 2.  No evidence of preseptal or postseptal collection or mass. 3.  Normal appearance of the globes and extraocular muscles.   Thyroid condition, history of low TSH that normalized with monitoring   Lab Results  Component Value Date   TSH 0.37 07/25/2017      High risk medication use - PREDNISONE x 3, s/p recent gastric sleeve  Add (Attention Deficit Disorder)  [place] On Cpap  Migraine  Vitamin D deficiency, on 50K IU weekly   Current vitamin D level is 40. Goal vitamin D level > 40. She will continue to supplement.     Insulin resistance, A1c 5.5 on 07/25/17   Lab Results  Component Value Date   HGBA1C 5.5 07/25/2017     Bipolar 1 disorder ([place]), followed by Psych, controlled with Lamictal  Morbid obesity ([place]), s/p gastric sleeve, at time of surgery - weight 382, BMI 61   Wt Readings from Last 3 Encounters:  07/25/17 290 lb 12.8 oz (131.9 kg)  05/02/17 (!) 316 lb (143.3 kg)  04/26/17 (!) 323 lb (146.5 kg)   Still followed by General Surgery. Working with a Automotive engineer that specializes in mindful eating. She knows about the BELT program, but doesn't feel that she has time to exercise yet. She takes all vitamin/mineral supplements daily. No issues with food intake. Down > 100 pounds already. The patient feels  that she is doing very well after the surgery. Unfortunately, she has had to take high doses of Prednisone on three separate occasions due to ocular complaints. She is followed by Dr. Katy Fitch and has [facility] upcoming [facility] at [facility].   GERD (gastroesophageal reflux disease), on Prilosec  History of neurocardiogenic syncope, no episodes since 2017  Seasonal allergies, controlled with Claritin  Class 3 obesity with serious comorbidity and body mass index (BMI) of 60.0 to 69.9 in adult (Resolved)  Other Fatigue (Resolved)  Heart Palpitations (Resolved)  Shortness of Breath On Exertion (Resolved)  Morbid Obesity With Bmi of 60.0-69.9, Adult ([facility]) (Resolved)   Briscoe Deutscher   CMA served as Education administrator during this visit. History, Physical, and Plan performed by medical provider. The above documentation has been reviewed and is accurate and complete. Briscoe Deutscher, D.O.  Records requested if needed.  Time spent with the patient: 30 minutes, of which >50% was spent in obtaining information about her symptoms, reviewing her previous labs, evaluations, and treatments, counseling her about her condition (please see the discussed topics above), and developing a plan to further investigate it; she had a number of questions which I addressed.

## 2017-07-26 DIAGNOSIS — F4323 Adjustment disorder with mixed anxiety and depressed mood: Secondary | ICD-10-CM | POA: Diagnosis not present

## 2017-07-26 LAB — HIV ANTIBODY (ROUTINE TESTING W REFLEX): HIV 1&2 Ab, 4th Generation: NONREACTIVE

## 2017-07-29 ENCOUNTER — Encounter: Payer: Self-pay | Admitting: Family Medicine

## 2017-07-29 DIAGNOSIS — E079 Disorder of thyroid, unspecified: Secondary | ICD-10-CM | POA: Insufficient documentation

## 2017-07-29 DIAGNOSIS — Z79899 Other long term (current) drug therapy: Secondary | ICD-10-CM | POA: Insufficient documentation

## 2017-07-29 DIAGNOSIS — H5711 Ocular pain, right eye: Secondary | ICD-10-CM | POA: Insufficient documentation

## 2017-07-29 DIAGNOSIS — G43909 Migraine, unspecified, not intractable, without status migrainosus: Secondary | ICD-10-CM | POA: Insufficient documentation

## 2017-07-29 DIAGNOSIS — Z87898 Personal history of other specified conditions: Secondary | ICD-10-CM | POA: Insufficient documentation

## 2017-07-29 DIAGNOSIS — F988 Other specified behavioral and emotional disorders with onset usually occurring in childhood and adolescence: Secondary | ICD-10-CM | POA: Insufficient documentation

## 2017-07-29 DIAGNOSIS — Z9989 Dependence on other enabling machines and devices: Secondary | ICD-10-CM

## 2017-07-29 DIAGNOSIS — E786 Lipoprotein deficiency: Secondary | ICD-10-CM | POA: Insufficient documentation

## 2017-07-29 DIAGNOSIS — N3289 Other specified disorders of bladder: Secondary | ICD-10-CM | POA: Insufficient documentation

## 2017-07-29 DIAGNOSIS — Z87891 Personal history of nicotine dependence: Secondary | ICD-10-CM | POA: Insufficient documentation

## 2017-07-29 DIAGNOSIS — G4733 Obstructive sleep apnea (adult) (pediatric): Secondary | ICD-10-CM | POA: Insufficient documentation

## 2017-07-29 NOTE — Progress Notes (Signed)
Labs are all stable. I am going to ask my CMA to check in on that [facility] appointment and call you with any new information.

## 2017-07-30 DIAGNOSIS — Z713 Dietary counseling and surveillance: Secondary | ICD-10-CM | POA: Diagnosis not present

## 2017-07-31 DIAGNOSIS — F4323 Adjustment disorder with mixed anxiety and depressed mood: Secondary | ICD-10-CM | POA: Diagnosis not present

## 2017-08-02 ENCOUNTER — Ambulatory Visit: Payer: Self-pay | Admitting: Skilled Nursing Facility1

## 2017-08-02 DIAGNOSIS — F4323 Adjustment disorder with mixed anxiety and depressed mood: Secondary | ICD-10-CM | POA: Diagnosis not present

## 2017-08-06 DIAGNOSIS — F4323 Adjustment disorder with mixed anxiety and depressed mood: Secondary | ICD-10-CM | POA: Diagnosis not present

## 2017-08-06 DIAGNOSIS — Z713 Dietary counseling and surveillance: Secondary | ICD-10-CM | POA: Diagnosis not present

## 2017-08-10 DIAGNOSIS — F4323 Adjustment disorder with mixed anxiety and depressed mood: Secondary | ICD-10-CM | POA: Diagnosis not present

## 2017-08-13 DIAGNOSIS — F4323 Adjustment disorder with mixed anxiety and depressed mood: Secondary | ICD-10-CM | POA: Diagnosis not present

## 2017-08-15 DIAGNOSIS — K219 Gastro-esophageal reflux disease without esophagitis: Secondary | ICD-10-CM | POA: Diagnosis not present

## 2017-08-15 DIAGNOSIS — F4323 Adjustment disorder with mixed anxiety and depressed mood: Secondary | ICD-10-CM | POA: Diagnosis not present

## 2017-08-16 DIAGNOSIS — Z713 Dietary counseling and surveillance: Secondary | ICD-10-CM | POA: Diagnosis not present

## 2017-08-18 DIAGNOSIS — G4733 Obstructive sleep apnea (adult) (pediatric): Secondary | ICD-10-CM | POA: Diagnosis not present

## 2017-08-20 DIAGNOSIS — Z713 Dietary counseling and surveillance: Secondary | ICD-10-CM | POA: Diagnosis not present

## 2017-08-22 DIAGNOSIS — N390 Urinary tract infection, site not specified: Secondary | ICD-10-CM | POA: Diagnosis not present

## 2017-08-22 DIAGNOSIS — F4323 Adjustment disorder with mixed anxiety and depressed mood: Secondary | ICD-10-CM | POA: Diagnosis not present

## 2017-08-22 DIAGNOSIS — R319 Hematuria, unspecified: Secondary | ICD-10-CM | POA: Diagnosis not present

## 2017-08-23 DIAGNOSIS — Z713 Dietary counseling and surveillance: Secondary | ICD-10-CM | POA: Diagnosis not present

## 2017-08-24 ENCOUNTER — Other Ambulatory Visit (HOSPITAL_COMMUNITY)
Admission: RE | Admit: 2017-08-24 | Discharge: 2017-08-24 | Disposition: A | Discharge status: Home / Self Care | Payer: BLUE CROSS/BLUE SHIELD | Source: Ambulatory Visit | Attending: Family Medicine | Admitting: Family Medicine

## 2017-08-24 ENCOUNTER — Encounter: Payer: Self-pay | Admitting: Family Medicine

## 2017-08-24 ENCOUNTER — Ambulatory Visit: Payer: BLUE CROSS/BLUE SHIELD | Admitting: Family Medicine

## 2017-08-24 VITALS — BP 120/80 | HR 78 | Temp 98.6°F | Ht 65.0 in | Wt 271.0 lb

## 2017-08-24 DIAGNOSIS — F319 Bipolar disorder, unspecified: Secondary | ICD-10-CM | POA: Diagnosis not present

## 2017-08-24 DIAGNOSIS — R5383 Other fatigue: Secondary | ICD-10-CM

## 2017-08-24 DIAGNOSIS — N760 Acute vaginitis: Secondary | ICD-10-CM | POA: Diagnosis not present

## 2017-08-24 DIAGNOSIS — R300 Dysuria: Secondary | ICD-10-CM | POA: Diagnosis not present

## 2017-08-24 DIAGNOSIS — Z124 Encounter for screening for malignant neoplasm of cervix: Secondary | ICD-10-CM

## 2017-08-24 DIAGNOSIS — N3000 Acute cystitis without hematuria: Secondary | ICD-10-CM

## 2017-08-24 DIAGNOSIS — F4323 Adjustment disorder with mixed anxiety and depressed mood: Secondary | ICD-10-CM | POA: Diagnosis not present

## 2017-08-24 LAB — POC URINALSYSI DIPSTICK (AUTOMATED)
Bilirubin, UA: NEGATIVE
Glucose, UA: NEGATIVE
Ketones, UA: NEGATIVE
Nitrite, UA: NEGATIVE
Spec Grav, UA: 1.015 (ref 1.010–1.025)
Urobilinogen, UA: 0.2 E.U./dL
pH, UA: 6 (ref 5.0–8.0)

## 2017-08-24 MED ORDER — URIBEL 118 MG PO CAPS: ORAL_CAPSULE | ORAL | 0 refills | Status: DC

## 2017-08-24 NOTE — Progress Notes (Signed)
Yvette White is a 47 y.o. female is here for follow up.  History of Present Illness:   Lonell Grandchild, CMA acting as scribe for Dr. Briscoe Deutscher.   HPI: Three days ago had severe discomfort with urination off and on. She was seen at urgent care on Wednesday. Told that she had bladder infection. She was started on antibiotic but she did not start yet she feels like their may be another reason because it only is painful for about three hours a day.   Patient is also having  Increased fatigue. Recent labs were normal but it is getting worse. She is not able to make it to lunch time with out being exhausted.     Review of Systems  Constitutional: Negative for chills and fever.  HENT: Negative for ear pain and hearing loss.   Eyes: Negative for blurred vision and double vision.  Respiratory: Negative for cough.   Cardiovascular: Negative for chest pain and palpitations.  Gastrointestinal: Negative for heartburn and nausea.  Genitourinary: Positive for dysuria. Negative for flank pain, frequency, hematuria and urgency.  Musculoskeletal: Negative for myalgias and neck pain.  Skin: Negative for rash.  Neurological: Negative for dizziness and headaches.   There are no preventive care reminders to display for this patient.   Depression screen [facility] 2/9 01/24/2017 01/18/2017  Decreased Interest 2 0  Down, Depressed, Hopeless 3 0  PHQ - 2 Score 5 0  Altered sleeping 3 -  Tired, decreased energy 3 -  Change in appetite 2 -  Feeling bad or failure about yourself  2 -  Trouble concentrating 1 -  Moving slowly or fidgety/restless 3 -  Suicidal thoughts 1 -  PHQ-9 Score 20 -  Difficult doing work/chores Very difficult -   PMHx, SurgHx, SocialHx, FamHx, Medications, and Allergies were reviewed in the Visit Navigator and updated as appropriate.   Patient Active Problem List   Diagnosis Date Noted  . Former smoker, quit in 2016 07/29/2017  . Low HDL (under 40) 07/29/2017  . Bladder  spasms 07/29/2017  . History of abnormal mammogram 07/29/2017  . Ocular pain, right eye 07/29/2017  . Thyroid condition, history of low TSH that normalized with monitoring 07/29/2017  . High risk medication use - PREDNISONE x 3, s/p recent gastric sleeve 07/29/2017  . ADD (attention deficit disorder)   . OSA on CPAP   . Migraine   . Vitamin D deficiency, on 50K IU weekly 05/02/2017  . Insulin resistance, A1c 5.5 on 07/25/17 05/02/2017  . 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 Bipolar 1 disorder ([place]), followed by Psych, controlled with Lamictal 01/24/2017  . Morbid obesity ([place]), s/p gastric sleeve, at time of surgery - weight 382, BMI 61 07/13/2016  . Abnormal menses 03/06/2016  . GERD (gastroesophageal reflux disease), on Prilosec 03/06/2016  . History of neurocardiogenic syncope, no episodes since 2017 03/06/2016  . Pharyngoesophageal dysphagia 03/06/2016  . Seasonal allergies, controlled with Claritin 08/25/2013   Social History   Tobacco Use  . Smoking status: Former Smoker    Packs/day: 0.25    Years: 20.00    Pack years: 5.00    Types: Cigarettes    Last attempt to quit: 09/30/2014    Years since quitting: 2.9  . Smokeless tobacco: Never Used  Substance Use Topics  . Alcohol use: Yes    Comment: occ  . Drug use: No   Current Medications and Allergies:   .  acetaminophen (TYLENOL) 500 MG tablet,  Take 1,000 mg by mouth every 6 (six) hours as needed (for pain.)., Disp: , Rfl:  .  amphetamine-dextroamphetamine (ADDERALL) 20 MG tablet, Take 20 mg by mouth daily., Disp: , Rfl:  .  Biotin 1000 MCG tablet, Take 1,000 mcg by mouth 2 (two) times daily., Disp: , Rfl:  .  Calcium Carbonate Antacid (ANTACID PO), Take 500 mg by mouth 3 (three) times daily. , Disp: , Rfl:  .  clonazePAM (KLONOPIN) 0.5 MG tablet, Take 0.5-1 mg by mouth 3 (three) times daily as needed for sleep or anxiety., Disp: , Rfl: 3 .  cyclobenzaprine (FLEXERIL) 10 MG tablet, Take 10 mg by  mouth at bedtime., Disp: , Rfl: 2 .  lamoTRIgine (LAMICTAL) 200 MG tablet, Take 200 mg by mouth every evening. , Disp: , Rfl:  .  loratadine (CLARITIN) 10 MG tablet, Take 10 mg by mouth every evening., Disp: , Rfl:  .  Multiple Vitamins-Minerals (CELEBRATE MULTI-COMPLETE 18 PO), Take by mouth., Disp: , Rfl:  .  omeprazole (PRILOSEC) 40 MG capsule, Take 40 mg by mouth daily., Disp: , Rfl:  .  PREDNISONE PO, Take by mouth., Disp: , Rfl:  .  Vitamin D, Ergocalciferol, (DRISDOL) 50000 units CAPS capsule, Take 1 capsule (50,000 Units total) by mouth every 7 (seven) days., Disp: 30 capsule, Rfl: 5   Allergies  Allergen Reactions  . Theophylline     Other reaction(s): Other (See Comments) Hyperactive  "Slo phylinne"  . Penicillins Rash    Has patient had a PCN reaction causing immediate rash, facial/tongue/throat swelling, SOB or lightheadedness with hypotension:unsure Has patient had a PCN reaction causing severe rash involving mucus membranes or skin necrosis:unsure Has patient had a PCN reaction that required hospitalization:No Has patient had a PCN reaction occurring within the last 10 years: No If all of the above answers are "NO", then may proceed with Cephalosporin use.   . Pseudoephedrine Rash  . Sulfamethoxazole-Trimethoprim Rash   Review of Systems   Pertinent items are noted in the HPI. Otherwise, ROS is negative.  Vitals:   Vitals:   08/24/17 1018  BP: 120/80  Pulse: 78  Temp: 98.6 F (37 C)  TempSrc: Oral  SpO2: 95%  Weight: 271 lb (122.9 kg)  Height: 5\' 5"  (1.651 m)     Body mass index is 45.1 kg/m.  Physical Exam:   Physical Exam  Constitutional: She is oriented to person, place, and time. She appears well-developed and well-nourished. No distress.  HENT:  Head: Normocephalic and atraumatic.  Right Ear: External ear normal.  Left Ear: External ear normal.  Nose: Nose normal.  Mouth/Throat: Oropharynx is clear and moist.  Eyes: Pupils are equal, round,  and reactive to light. Conjunctivae and EOM are normal.  Neck: Normal range of motion. Neck supple. No thyromegaly present.  Cardiovascular: Normal rate, regular rhythm, normal heart sounds and intact distal pulses.  Pulmonary/Chest: Effort normal and breath sounds normal.  Abdominal: Soft. Bowel sounds are normal.  Genitourinary: Rectum normal and vagina normal. There is no lesion on the right labia. There is no lesion on the left labia. Uterus is not enlarged. Cervix exhibits no motion tenderness, no discharge and no friability. Right adnexum displays no mass. Left adnexum displays no mass.  Musculoskeletal: Normal range of motion.  Lymphadenopathy:    She has no cervical adenopathy.  Neurological: She is alert and oriented to person, place, and time.  Skin: Skin is warm and dry. Capillary refill takes less than 2 seconds.  Psychiatric: She has  a normal mood and affect. Her behavior is normal.  Nursing note and vitals reviewed.   Results for orders placed or performed in visit on 08/24/17  Urine Culture  Result Value Ref Range   MICRO NUMBER: [id number]    SPECIMEN QUALITY: ADEQUATE    Sample Source URINE    STATUS: FINAL    ISOLATE 1: Proteus mirabilis (A)       Susceptibility   Proteus mirabilis - URINE CULTURE, REFLEX    AMOX/CLAVULANIC <=2 Sensitive     AMPICILLIN <=2 Sensitive     AMPICILLIN/SULBACTAM <=2 Sensitive     CEFAZOLIN* <=4 Not Reportable      * For infections other than uncomplicated UTIcaused by E. coli, K. pneumoniae or P. mirabilis:Cefazolin is resistant if MIC > or = 8 mcg/mL.(Distinguishing susceptible versus intermediatefor isolates with MIC < or = 4 mcg/mL requiresadditional testing.)For uncomplicated UTI caused by E. coli,K. pneumoniae or P. mirabilis: Cefazolin issusceptible if MIC <32 mcg/mL and predictssusceptible to the oral agents cefaclor, cefdinir,cefpodoxime, cefprozil, cefuroxime, cephalexinand loracarbef.    CEFEPIME <=1 Sensitive     CEFTRIAXONE <=1  Sensitive     CIPROFLOXACIN <=0.25 Sensitive     LEVOFLOXACIN <=0.12 Sensitive     ERTAPENEM <=0.5 Sensitive     GENTAMICIN <=1 Sensitive     NITROFURANTOIN 128 Resistant     PIP/TAZO <=4 Sensitive     TOBRAMYCIN <=1 Sensitive     TRIMETH/SULFA* >=320 Resistant      * For infections other than uncomplicated UTIcaused by E. coli, K. pneumoniae or P. mirabilis:Cefazolin is resistant if MIC > or = 8 mcg/mL.(Distinguishing susceptible versus intermediatefor isolates with MIC < or = 4 mcg/mL requiresadditional testing.)For uncomplicated UTI caused by E. coli,K. pneumoniae or P. mirabilis: Cefazolin issusceptible if MIC <32 mcg/mL and predictssusceptible to the oral agents cefaclor, cefdinir,cefpodoxime, cefprozil, cefuroxime, cephalexinand loracarbef.Legend:S = Susceptible  I = IntermediateR = Resistant  NS = Not susceptible* = Not tested  NR = Not reported**NN = See antimicrobic comments  POCT Urinalysis Dipstick (Automated)  Result Value Ref Range   Color, UA Yellow    Clarity, UA Cloudy    Glucose, UA Negative    Bilirubin, UA Negative    Ketones, UA Negative    Spec Grav, UA 1.015 1.010 - 1.025   Blood, UA Large    pH, UA 6.0 5.0 - 8.0   Protein, UA Small    Urobilinogen, UA 0.2 0.2 or 1.0 E.U./dL   Nitrite, UA Negative    Leukocytes, UA Large (3+) (A) Negative  Cytology - PAP  Result Value Ref Range   Adequacy      Satisfactory for evaluation  endocervical/transformation zone component PRESENT.   Diagnosis      NEGATIVE FOR INTRAEPITHELIAL LESIONS OR MALIGNANCY.   Bacterial vaginitis Negative for Bacterial Vaginitis Microorganisms    Candida vaginitis Negative for Candida species    HPV NOT DETECTED    Material Submitted CervicoVaginal Pap [ThinPrep Imaged]    Assessment and Plan:   1. Dysuria - POCT Urinalysis Dipstick (Automated) - Urine Culture - Meth-Hyo-M Bl-Na Phos-Ph Sal (URIBEL) 118 MG CAPS; One po QID prn bladder spasm  Dispense: 30 capsule; Refill: 0  2. Screening  for cervical cancer - Cytology - PAP  3. Fatigue, unspecified type Patient works a few jobs, doesn't exercise. She endorses taking vitamins. Recent labs look great. Denies depression. CPAP compliant. Will recommend maintaining protein levels.   - buPROPion (WELLBUTRIN XL) 300 MG 24 hr tablet; Take 300 mg  by mouth daily.  4. Acute vaginitis Wet prep as above.  6. Acute cystitis without hematuria - ciprofloxacin (CIPRO) 500 MG tablet; Take 1 tablet (500 mg total) by mouth 2 (two) times daily for 7 days.  Dispense: 14 tablet; Refill: 0  7. Bipolar 1 disorder (HCC) Stable.   . Reviewed expectations re: course of current medical issues. . Discussed self-management of symptoms. . Outlined signs and symptoms indicating need for more acute intervention. . Patient verbalized understanding and all questions were answered. Marland Kitchen Health Maintenance issues including appropriate healthy diet, exercise, and smoking avoidance were discussed with patient. . See orders for this visit as documented in the electronic medical record. . Patient received an After Visit Summary.  CMA served as Education administrator during this visit. History, Physical, and Plan performed by medical provider. The above documentation has been reviewed and is accurate and complete. Briscoe Deutscher, D.O.  Briscoe Deutscher, DO [place] [place] [facility] 09/01/2017

## 2017-08-26 LAB — URINE CULTURE
MICRO NUMBER:: 90394160
SPECIMEN QUALITY:: ADEQUATE

## 2017-08-26 MED ORDER — CIPROFLOXACIN HCL 500 MG PO TABS: 500.0000 mg | ORAL_TABLET | Freq: Two times a day (BID) | ORAL | 0 refills | Status: AC

## 2017-08-27 DIAGNOSIS — Z713 Dietary counseling and surveillance: Secondary | ICD-10-CM | POA: Diagnosis not present

## 2017-08-28 DIAGNOSIS — F4323 Adjustment disorder with mixed anxiety and depressed mood: Secondary | ICD-10-CM | POA: Diagnosis not present

## 2017-08-28 DIAGNOSIS — Z713 Dietary counseling and surveillance: Secondary | ICD-10-CM | POA: Diagnosis not present

## 2017-08-28 LAB — CYTOLOGY - PAP
Bacterial vaginitis: NEGATIVE
Candida vaginitis: NEGATIVE
Diagnosis: NEGATIVE
HPV: NOT DETECTED

## 2017-08-29 DIAGNOSIS — F4323 Adjustment disorder with mixed anxiety and depressed mood: Secondary | ICD-10-CM | POA: Diagnosis not present

## 2017-08-30 DIAGNOSIS — Z713 Dietary counseling and surveillance: Secondary | ICD-10-CM | POA: Diagnosis not present

## 2017-09-07 DIAGNOSIS — F4323 Adjustment disorder with mixed anxiety and depressed mood: Secondary | ICD-10-CM | POA: Diagnosis not present

## 2017-09-10 DIAGNOSIS — F4323 Adjustment disorder with mixed anxiety and depressed mood: Secondary | ICD-10-CM | POA: Diagnosis not present

## 2017-09-12 DIAGNOSIS — F4323 Adjustment disorder with mixed anxiety and depressed mood: Secondary | ICD-10-CM | POA: Diagnosis not present

## 2017-09-13 DIAGNOSIS — Z713 Dietary counseling and surveillance: Secondary | ICD-10-CM | POA: Diagnosis not present

## 2017-09-15 IMAGING — NM NM MISC PROCEDURE
3 series · 18 of 18 positions shown · non-contrast
Comparison: none

[Series 1: wbr_s-proj_st stress_(id)_sa · 6.5mm · 6.51mm/px · 6 of 512 frames shown (1 of 2)]
[frame 43/512]
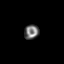
[frame 128/512]
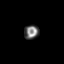
[frame 214/512]
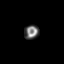
[frame 299/512]
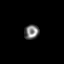
[frame 384/512]
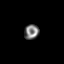
[frame 470/512]
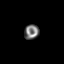

[Series 1: wbr_r-proj_st rest_(id)_sa · 6.5mm · 6.51mm/px · 6 of 64 frames shown]
[frame 6/64]
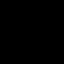
[frame 16/64]
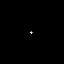
[frame 27/64]
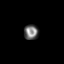
[frame 38/64]
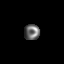
[frame 48/64]
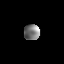
[frame 59/64]
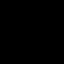

[Series 1: wbr_s-proj_st stress_(id)_sa · 6.5mm · 6.51mm/px · 6 of 64 frames shown (2 of 2)]
[frame 6/64]
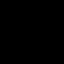
[frame 16/64]
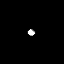
[frame 27/64]
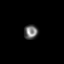
[frame 38/64]
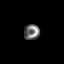
[frame 48/64]
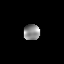
[frame 59/64]
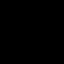

[18 of 18 positions shown; findings below may reference images not displayed]

Canned report from images found in remote index.

Refer to host system for actual result text.

## 2017-09-18 DIAGNOSIS — G4733 Obstructive sleep apnea (adult) (pediatric): Secondary | ICD-10-CM | POA: Diagnosis not present

## 2017-09-18 DIAGNOSIS — F4323 Adjustment disorder with mixed anxiety and depressed mood: Secondary | ICD-10-CM | POA: Diagnosis not present

## 2017-09-19 DIAGNOSIS — H0510 Unspecified chronic inflammatory disorders of orbit: Secondary | ICD-10-CM | POA: Diagnosis not present

## 2017-09-19 DIAGNOSIS — H5711 Ocular pain, right eye: Secondary | ICD-10-CM | POA: Diagnosis not present

## 2017-09-20 DIAGNOSIS — Z713 Dietary counseling and surveillance: Secondary | ICD-10-CM | POA: Diagnosis not present

## 2017-09-20 DIAGNOSIS — F4323 Adjustment disorder with mixed anxiety and depressed mood: Secondary | ICD-10-CM | POA: Diagnosis not present

## 2017-09-24 DIAGNOSIS — F4323 Adjustment disorder with mixed anxiety and depressed mood: Secondary | ICD-10-CM | POA: Diagnosis not present

## 2017-09-26 DIAGNOSIS — F4323 Adjustment disorder with mixed anxiety and depressed mood: Secondary | ICD-10-CM | POA: Diagnosis not present

## 2017-09-27 DIAGNOSIS — M545 Low back pain, unspecified: Secondary | ICD-10-CM | POA: Insufficient documentation

## 2017-09-27 DIAGNOSIS — G8929 Other chronic pain: Secondary | ICD-10-CM | POA: Insufficient documentation

## 2017-09-27 DIAGNOSIS — M546 Pain in thoracic spine: Secondary | ICD-10-CM | POA: Insufficient documentation

## 2017-09-27 DIAGNOSIS — Z713 Dietary counseling and surveillance: Secondary | ICD-10-CM | POA: Diagnosis not present

## 2017-10-04 DIAGNOSIS — F4323 Adjustment disorder with mixed anxiety and depressed mood: Secondary | ICD-10-CM | POA: Diagnosis not present

## 2017-10-05 DIAGNOSIS — Z713 Dietary counseling and surveillance: Secondary | ICD-10-CM | POA: Diagnosis not present

## 2017-10-08 DIAGNOSIS — F4323 Adjustment disorder with mixed anxiety and depressed mood: Secondary | ICD-10-CM | POA: Diagnosis not present

## 2017-10-10 DIAGNOSIS — F4323 Adjustment disorder with mixed anxiety and depressed mood: Secondary | ICD-10-CM | POA: Diagnosis not present

## 2017-10-12 DIAGNOSIS — Z713 Dietary counseling and surveillance: Secondary | ICD-10-CM | POA: Diagnosis not present

## 2017-10-15 DIAGNOSIS — Z713 Dietary counseling and surveillance: Secondary | ICD-10-CM | POA: Diagnosis not present

## 2017-10-15 DIAGNOSIS — F4323 Adjustment disorder with mixed anxiety and depressed mood: Secondary | ICD-10-CM | POA: Diagnosis not present

## 2017-10-17 DIAGNOSIS — F4323 Adjustment disorder with mixed anxiety and depressed mood: Secondary | ICD-10-CM | POA: Diagnosis not present

## 2017-10-18 DIAGNOSIS — G4733 Obstructive sleep apnea (adult) (pediatric): Secondary | ICD-10-CM | POA: Diagnosis not present

## 2017-10-19 DIAGNOSIS — Z713 Dietary counseling and surveillance: Secondary | ICD-10-CM | POA: Diagnosis not present

## 2017-10-24 ENCOUNTER — Ambulatory Visit: Payer: Self-pay | Admitting: Neurology

## 2017-10-26 DIAGNOSIS — Z713 Dietary counseling and surveillance: Secondary | ICD-10-CM | POA: Diagnosis not present

## 2017-10-26 DIAGNOSIS — F4323 Adjustment disorder with mixed anxiety and depressed mood: Secondary | ICD-10-CM | POA: Diagnosis not present

## 2017-10-31 ENCOUNTER — Ambulatory Visit: Payer: BLUE CROSS/BLUE SHIELD | Admitting: Family Medicine

## 2017-10-31 ENCOUNTER — Encounter: Payer: Self-pay | Admitting: Family Medicine

## 2017-10-31 VITALS — BP 112/62 | HR 62 | Temp 98.3°F | Ht 69.0 in | Wt 256.0 lb

## 2017-10-31 DIAGNOSIS — N3000 Acute cystitis without hematuria: Secondary | ICD-10-CM

## 2017-10-31 DIAGNOSIS — F4323 Adjustment disorder with mixed anxiety and depressed mood: Secondary | ICD-10-CM | POA: Diagnosis not present

## 2017-10-31 DIAGNOSIS — R350 Frequency of micturition: Secondary | ICD-10-CM | POA: Diagnosis not present

## 2017-10-31 LAB — POCT URINALYSIS DIPSTICK
Bilirubin, UA: NEGATIVE
Glucose, UA: NEGATIVE
Ketones, UA: NEGATIVE
Nitrite, UA: NEGATIVE
Protein, UA: NEGATIVE
Spec Grav, UA: 1.01 (ref 1.010–1.025)
Urobilinogen, UA: 0.2 E.U./dL
pH, UA: 7 (ref 5.0–8.0)

## 2017-10-31 MED ORDER — CIPROFLOXACIN HCL 500 MG PO TABS: 500.0000 mg | ORAL_TABLET | Freq: Two times a day (BID) | ORAL | 0 refills | Status: DC

## 2017-10-31 NOTE — Progress Notes (Signed)
Patient: Yvette White MRN: [id number] DOB: Nov 23, 1970 PCP: Briscoe Deutscher, DO     Subjective:  Chief Complaint  Patient presents with  . Urinary Frequency    HPI: The patient is a 47 y.o. female who presents today for urinary frequency/urgency and dysuria. She was seen on 08/24/17 and was treated for a UTI. meds changed to cipro due to resistance. She states her symptoms went away, but not completely. She states symptoms started to get progressively worse this week. She has no CVA tenderness and no fever/chills. She does have a hx of frequent UTI as a child (5 years) and had a procedure done where they widened the urethra. She has no visible blood in her urine. She is drinking lots of water. No change to her lifestyle. Urine also starting to have a smell, but is clear in color.   Review of Systems  Constitutional: Negative for chills, fatigue and fever.  HENT: Negative for facial swelling.   Eyes: Negative for discharge and itching.  Respiratory: Negative for choking and shortness of breath.   Gastrointestinal: Negative for abdominal pain, diarrhea, nausea and rectal pain.  Genitourinary: Positive for frequency and urgency.  Neurological: Negative for weakness and headaches.  Psychiatric/Behavioral: Negative for confusion.    Allergies Patient is allergic to theophylline; penicillins; pseudoephedrine; and sulfamethoxazole-trimethoprim.  Past Medical History Patient  has a past medical history of ADD (attention deficit disorder), controlled with Adderall, Anemia, Anxiety, Arthritis, Chicken pox, Depression, Disorder of thyroid, GERD (gastroesophageal reflux disease), Heart murmur, Hyperthyroidism, Menstrual disorder, Migraine, Morbid obesity ([place]), Neurocardiogenic syncope, OSA on CPAP, Pre-diabetes, Swallowing difficulty, and Vitamin D deficiency.  Surgical History Patient  has a past surgical history that includes Urethral dilation (1979); Tilt table study; Wisdom tooth extraction;  Dilatation & curettage/hysteroscopy with myosure (N/A, 06/08/2016); and Laparoscopic gastric sleeve resection (N/A, 02/20/2017).  Family History Pateint's family history includes Atrial fibrillation in her maternal grandmother; Cancer in her mother; Colon cancer (age of onset: 50) in her maternal grandfather; Depression in her mother; Diabetes in her father and mother; Heart attack (age of onset: 35) in her father; Heart disease in her father, mother, paternal grandfather, and paternal grandmother; Hyperlipidemia in her father and mother; Hypertension in her father and mother; Kidney disease in her mother; Obesity in her father and mother; Parkinson's disease in her maternal grandmother; Sleep apnea in her mother; Thyroid disease in her mother; Uterine cancer in her mother.  Social History Patient  reports that she quit smoking about 3 years ago. Her smoking use included cigarettes. She has a 5.00 pack-year smoking history. She has never used smokeless tobacco. She reports that she drinks alcohol. She reports that she does not use drugs.    Objective: Vitals:   10/31/17 1121  BP: 112/62  Pulse: 62  Temp: 98.3 F (36.8 C)  SpO2: 96%  Weight: 256 lb (116.1 kg)  Height: 5\' 9"  (1.753 m)    Body mass index is 37.8 kg/m.  Physical Exam  Constitutional: She appears well-developed and well-nourished.  Cardiovascular: Normal rate, regular rhythm and normal heart sounds.  Pulmonary/Chest: Effort normal and breath sounds normal.  Abdominal: Soft. Bowel sounds are normal. There is tenderness (suprapubically ).  Musculoskeletal:  No cva tenderness   Vitals reviewed.  Urine dipstick shows negative for all components, positive for leukocytes.     Assessment/plan: 1. Acute cystitis without hematuria UA same as last visit with +culture. Will do cipro since we know was sensitive to this. Continue water. F/u on culture.  Fever/chills/ back pain: ER.  - POCT urinalysis dipstick - Urine  Culture   Return if symptoms worsen or fail to improve.    Orma Flaming, MD [place]   10/31/2017

## 2017-11-02 DIAGNOSIS — Z713 Dietary counseling and surveillance: Secondary | ICD-10-CM | POA: Diagnosis not present

## 2017-11-03 LAB — URINE CULTURE
MICRO NUMBER:: 90676534
SPECIMEN QUALITY:: ADEQUATE

## 2017-11-14 DIAGNOSIS — H0510 Unspecified chronic inflammatory disorders of orbit: Secondary | ICD-10-CM | POA: Diagnosis not present

## 2017-11-14 DIAGNOSIS — F4323 Adjustment disorder with mixed anxiety and depressed mood: Secondary | ICD-10-CM | POA: Diagnosis not present

## 2017-11-14 DIAGNOSIS — H5711 Ocular pain, right eye: Secondary | ICD-10-CM | POA: Diagnosis not present

## 2017-11-15 ENCOUNTER — Telehealth: Payer: Self-pay | Admitting: Family Medicine

## 2017-11-15 ENCOUNTER — Telehealth: Payer: Self-pay

## 2017-11-15 ENCOUNTER — Encounter: Payer: Self-pay | Admitting: *Deleted

## 2017-11-15 ENCOUNTER — Ambulatory Visit: Payer: Self-pay | Admitting: Family Medicine

## 2017-11-15 NOTE — Telephone Encounter (Signed)
Spoke to pt.  Seen on 6/5 and dx

## 2017-11-15 NOTE — Telephone Encounter (Signed)
L/m to get more information about symptoms.  CRM Started pleas ask patient about symptoms

## 2017-11-15 NOTE — Telephone Encounter (Signed)
See note

## 2017-11-15 NOTE — Telephone Encounter (Signed)
Spoke to pt, she was seen on 6/5 by [name] and dx with acute cystitis.  Pt completed course of antibiotics and is now having recurring sx.  Urine is cloudy and she is having dysuria.  Scheduled pt for appt w/[name] for Friday 11/16/17 at 10:30 a.m.

## 2017-11-15 NOTE — Telephone Encounter (Signed)
She called in c/o continued symptoms after completing the course of Cipro.   She has been seen for UTI symptoms by [name] on 08/24/17 and [name] on 10/31/17.     She c/o still having burning and cloudy urine.   Her symptoms improved but never went away.   When she was seen the first time by [name] and her symptoms came back she waited and they got worse so she is calling in sooner this time since the symptoms did not go away. I let her know I would route a note to [name] since she saw her last and someone would be in contact with her regarding another antibiotic or if she needed to be seen again.   She was agreeable to this plan.  I routed a note to Dr. Shelby Mattocks nurse pool.    Reason for Disposition . [1] Taking antibiotic > 72 hours (3 days) for UTI AND [2] painful urination or frequency not improved  Answer Assessment - Initial Assessment Questions 1. SYMPTOM: "What's the main symptom you're concerned about?" (e.g., frequency, incontinence)     Urine cloudy, You have burning do not have urgency.   No abd or back pain.   2. ONSET: "When did the  ________  start?"     They never went away since I was seen just improved slightly.  3. PAIN: "Is there any pain?" If so, ask: "How bad is it?" (Scale: 1-10; mild, moderate, severe)     Burning 4. CAUSE: "What do you think is causing the symptoms?"     UTI 5. OTHER SYMPTOMS: "Do you have any other symptoms?" (e.g., fever, flank pain, blood in urine, pain with urination)     No back or abd pain. 6. PREGNANCY: "Is there any chance you are pregnant?" "When was your last menstrual period?"     Periodically have periods.  Answer Assessment - Initial Assessment Questions 1. ANTIBIOTIC: "What antibiotic are you taking?" "How many times per day?"     She saw [name] who prescribed Cipro.   She completed taking it. 2. DURATION: "When was the antibiotic started?"     *No Answer* 3. MAIN SYMPTOM: "What is the main symptom you are concerned  about?"     My symptoms improved but never went away.   I'm still having burning. 4. FEVER: "Do you have a fever?" If so, ask: "What is it, how was it measured, and when did it start?"     No 5. OTHER SYMPTOMS: "Do you have any other symptoms?" (e.g., flank pain, vaginal discharge, blood in urine)     Denies abd or back/flank pain.  Protocols used: URINARY TRACT INFECTION ON ANTIBIOTIC FOLLOW-UP CALL - FEMALE-A-AH, URINARY [facility]

## 2017-11-15 NOTE — Telephone Encounter (Signed)
This encounter was created in error - please disregard.

## 2017-11-15 NOTE — Telephone Encounter (Signed)
See note.   Copied from [place] [id number]. Topic: General - Other >> Nov 15, 2017 10:02 AM Yvette Rack wrote: Reason for CRM: Pt states she is still experiencing symptoms of the UTI. Pt states it has improved but she still has it and would like another Rx sent to [place], [address] [place] [phone] (Phone)  [phone] (Fax). Pt request a call back. Cb# [phone]

## 2017-11-16 ENCOUNTER — Encounter: Payer: Self-pay | Admitting: Family Medicine

## 2017-11-16 ENCOUNTER — Ambulatory Visit: Payer: BLUE CROSS/BLUE SHIELD | Admitting: Family Medicine

## 2017-11-16 VITALS — BP 108/82 | HR 61 | Temp 98.2°F | Wt 255.4 lb

## 2017-11-16 DIAGNOSIS — N390 Urinary tract infection, site not specified: Secondary | ICD-10-CM | POA: Diagnosis not present

## 2017-11-16 DIAGNOSIS — Z713 Dietary counseling and surveillance: Secondary | ICD-10-CM | POA: Diagnosis not present

## 2017-11-16 DIAGNOSIS — N3000 Acute cystitis without hematuria: Secondary | ICD-10-CM

## 2017-11-16 DIAGNOSIS — R300 Dysuria: Secondary | ICD-10-CM

## 2017-11-16 LAB — POCT URINALYSIS DIPSTICK
BILIRUBIN UA: NEGATIVE
GLUCOSE UA: NEGATIVE
KETONES UA: NEGATIVE
Leukocytes, UA: NEGATIVE
Nitrite, UA: NEGATIVE
PH UA: 7 (ref 5.0–8.0)
Protein, UA: NEGATIVE
RBC UA: NEGATIVE
Spec Grav, UA: 1.01 (ref 1.010–1.025)
UROBILINOGEN UA: 0.2 U/dL

## 2017-11-16 NOTE — Telephone Encounter (Signed)
Patient is seeing [name] today.

## 2017-11-16 NOTE — Progress Notes (Signed)
Patient: Yvette White MRN: [id number] DOB: March 07, 1971 PCP: Briscoe Deutscher, DO     Subjective:  Chief Complaint  Patient presents with  . poss UTI    dysuria, cloudy urine    HPI: The patient is a 47 y.o. female who presents today for dysuria. I saw her on 6/5 for similar symptoms and her urine grew proteus m. Sensitive to cipro. I gave her a 7 day course of cipro. She took the cipro and feels better; however, she is still having an occasional odor and has a cloudy color to it. She has no visible blood in her urine, no flank pain and no fevers. She does have dysuria at times and pain at the beginning and end of the stream. No frequency or urgency. She has been seen multiple times for urine symptoms. She has seen urology once before a few years ago. She thinks she did an ultrasound at that time. She was told she had a spastic bladder at that time. 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 Last time she was up to 50mg  of prednisone.   Review of Systems  Constitutional: Negative for fatigue and fever.  Eyes: Positive for visual disturbance.  Gastrointestinal: Negative for abdominal pain.  Genitourinary: Positive for dysuria. Negative for difficulty urinating, flank pain, frequency, hematuria, pelvic pain, urgency and vaginal discharge.    Allergies Patient is allergic to theophylline; penicillins; pseudoephedrine; and sulfamethoxazole-trimethoprim.  Past Medical History Patient  has a past medical history of ADD (attention deficit disorder), controlled with Adderall, Anemia, Anxiety, Arthritis, Chicken pox, Depression, Disorder of thyroid, GERD (gastroesophageal reflux disease), Heart murmur, Hyperthyroidism, Menstrual disorder, Migraine, Morbid obesity ([place]), Neurocardiogenic syncope, OSA on CPAP, Pre-diabetes, Swallowing difficulty, and Vitamin D deficiency.  Surgical History Patient  has a past surgical history that includes Urethral  dilation (1979); Tilt table study; Wisdom tooth extraction; Dilatation & curettage/hysteroscopy with myosure (N/A, 06/08/2016); and Laparoscopic gastric sleeve resection (N/A, 02/20/2017).  Family History Pateint's family history includes Atrial fibrillation in her maternal grandmother; Cancer in her mother; Colon cancer (age of onset: 80) in her maternal grandfather; Depression in her mother; Diabetes in her father and mother; Heart attack (age of onset: 86) in her father; Heart disease in her father, mother, paternal grandfather, and paternal grandmother; Hyperlipidemia in her father and mother; Hypertension in her father and mother; Kidney disease in her mother; Obesity in her father and mother; Parkinson's disease in her maternal grandmother; Sleep apnea in her mother; Thyroid disease in her mother; Uterine cancer in her mother.  Social History Patient  reports that she quit smoking about 3 years ago. Her smoking use included cigarettes. She has a 5.00 pack-year smoking history. She has never used smokeless tobacco. She reports that she drinks alcohol. She reports that she does not use drugs.    Objective: Vitals:   11/16/17 1035  BP: 108/82  Pulse: 61  Temp: 98.2 F (36.8 C)  TempSrc: Oral  SpO2: 98%  Weight: 255 lb 6.4 oz (115.8 kg)    Body mass index is 37.72 kg/m.  Physical Exam  Constitutional: She appears well-developed and well-nourished.  Cardiovascular: Normal rate, regular rhythm and normal heart sounds.  Pulmonary/Chest: Effort normal and breath sounds normal.  Abdominal: Soft. Bowel sounds are normal. There is no tenderness.  Musculoskeletal:  No CVA tenderness   Vitals reviewed.      Assessment/plan: 1. Dysuria -UA clean. Will wait on culture. I wonder if the chronic  steroid use is contributing to her UTIs. Will do referral to urology to make sure no IC component as well. May need chronic abx, but will let urology decide this. Will f/u on culture. She is happy with  this.  - POCT urinalysis dipstick - Urine Culture  3. Recurrent UTI  - Ambulatory referral to Urology     Return if symptoms worsen or fail to improve.   Orma Flaming, MD [place]   11/16/2017

## 2017-11-18 LAB — URINE CULTURE
MICRO NUMBER: 90745370
SPECIMEN QUALITY: ADEQUATE

## 2017-11-19 DIAGNOSIS — R946 Abnormal results of thyroid function studies: Secondary | ICD-10-CM | POA: Diagnosis not present

## 2017-11-19 DIAGNOSIS — H0510 Unspecified chronic inflammatory disorders of orbit: Secondary | ICD-10-CM | POA: Diagnosis not present

## 2017-11-21 DIAGNOSIS — H0510 Unspecified chronic inflammatory disorders of orbit: Secondary | ICD-10-CM | POA: Diagnosis not present

## 2017-11-21 DIAGNOSIS — H02402 Unspecified ptosis of left eyelid: Secondary | ICD-10-CM | POA: Diagnosis not present

## 2017-11-21 DIAGNOSIS — Z79899 Other long term (current) drug therapy: Secondary | ICD-10-CM | POA: Diagnosis not present

## 2017-11-23 DIAGNOSIS — Z713 Dietary counseling and surveillance: Secondary | ICD-10-CM | POA: Diagnosis not present

## 2017-11-27 DIAGNOSIS — H04021 Chronic dacryoadenitis, right lacrimal gland: Secondary | ICD-10-CM | POA: Diagnosis not present

## 2017-11-27 DIAGNOSIS — H05121 Orbital myositis, right orbit: Secondary | ICD-10-CM | POA: Diagnosis not present

## 2017-11-27 DIAGNOSIS — K219 Gastro-esophageal reflux disease without esophagitis: Secondary | ICD-10-CM | POA: Diagnosis not present

## 2017-11-27 DIAGNOSIS — Z9884 Bariatric surgery status: Secondary | ICD-10-CM | POA: Diagnosis not present

## 2017-11-27 DIAGNOSIS — F418 Other specified anxiety disorders: Secondary | ICD-10-CM | POA: Diagnosis not present

## 2017-11-27 DIAGNOSIS — H0510 Unspecified chronic inflammatory disorders of orbit: Secondary | ICD-10-CM | POA: Diagnosis not present

## 2017-11-27 DIAGNOSIS — Z6841 Body Mass Index (BMI) 40.0 and over, adult: Secondary | ICD-10-CM | POA: Diagnosis not present

## 2017-11-27 DIAGNOSIS — Z87891 Personal history of nicotine dependence: Secondary | ICD-10-CM | POA: Diagnosis not present

## 2017-11-27 DIAGNOSIS — Z79899 Other long term (current) drug therapy: Secondary | ICD-10-CM | POA: Diagnosis not present

## 2017-11-27 DIAGNOSIS — E669 Obesity, unspecified: Secondary | ICD-10-CM | POA: Diagnosis not present

## 2017-12-06 DIAGNOSIS — H0510 Unspecified chronic inflammatory disorders of orbit: Secondary | ICD-10-CM | POA: Diagnosis not present

## 2017-12-06 DIAGNOSIS — H02401 Unspecified ptosis of right eyelid: Secondary | ICD-10-CM | POA: Diagnosis not present

## 2017-12-07 DIAGNOSIS — Z713 Dietary counseling and surveillance: Secondary | ICD-10-CM | POA: Diagnosis not present

## 2017-12-14 DIAGNOSIS — Z713 Dietary counseling and surveillance: Secondary | ICD-10-CM | POA: Diagnosis not present

## 2017-12-17 DIAGNOSIS — Z713 Dietary counseling and surveillance: Secondary | ICD-10-CM | POA: Diagnosis not present

## 2017-12-17 DIAGNOSIS — F4323 Adjustment disorder with mixed anxiety and depressed mood: Secondary | ICD-10-CM | POA: Diagnosis not present

## 2017-12-21 DIAGNOSIS — Z713 Dietary counseling and surveillance: Secondary | ICD-10-CM | POA: Diagnosis not present

## 2017-12-23 NOTE — Progress Notes (Signed)
Yvette White is a 47 y.o. female is here for follow up.  History of Present Illness:   Yvette White CMA acting as scribe for Dr. Juleen White.  HPI: Patient comes in today for her 5 month follow up.   Dysuria: Patient has had burning and urgency since March. She is still having the same symptoms. She was treated with Cipro on June 5 by [name]. We will recheck urine today.   Morbid obesity: Patient had Gastric sleeve surgery done September 2018 and has lost over a 135 pounds since surgery.   Health Maintenance Due  Topic Date Due  . INFLUENZA VACCINE  12/27/2017   Depression screen [facility] 2/9 12/24/2017 01/24/2017 01/18/2017  Decreased Interest 0 2 0  Down, Depressed, Hopeless 1 3 0  PHQ - 2 Score 1 5 0  Altered sleeping 2 3 -  Tired, decreased energy 1 3 -  Change in appetite 2 2 -  Feeling bad or failure about yourself  2 2 -  Trouble concentrating 1 1 -  Moving slowly or fidgety/restless 1 3 -  Suicidal thoughts 0 1 -  PHQ-9 Score 10 20 -  Difficult doing work/chores Somewhat difficult Very difficult -   PMHx, SurgHx, SocialHx, FamHx, Medications, and Allergies were reviewed in the Visit Navigator and updated as appropriate.   Patient Active Problem List   Diagnosis Date Noted  . Former smoker, quit in 2016 07/29/2017  . Low HDL (under 40) 07/29/2017  . Bladder spasms 07/29/2017  . History of abnormal mammogram 07/29/2017  . Ocular pain, right eye 07/29/2017  . Thyroid condition, history of low TSH that normalized with monitoring 07/29/2017  . High risk medication use - PREDNISONE x 3, s/p recent gastric sleeve 07/29/2017  . ADD (attention deficit disorder)   . OSA on CPAP   . Migraine   . Vitamin D deficiency, on 50K IU weekly 05/02/2017  . Insulin resistance, A1c 5.5 on 07/25/17 05/02/2017  . 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 Bipolar 1 disorder ([place]), followed by Psych, controlled with Lamictal 01/24/2017  . Morbid obesity ([place]), s/p gastric  sleeve, at time of surgery - weight 382, BMI 61 07/13/2016  . Abnormal menses 03/06/2016  . GERD (gastroesophageal reflux disease), on Prilosec 03/06/2016  . History of neurocardiogenic syncope, no episodes since 2017 03/06/2016  . Pharyngoesophageal dysphagia 03/06/2016  . Seasonal allergies, controlled with Claritin 08/25/2013   Social History   Tobacco Use  . Smoking status: Former Smoker    Packs/day: 0.25    Years: 20.00    Pack years: 5.00    Types: Cigarettes    Last attempt to quit: 09/30/2014    Years since quitting: 3.2  . Smokeless tobacco: Never Used  Substance Use Topics  . Alcohol use: Yes    Comment: occ  . Drug use: No   Current Medications and Allergies:   .  acetaminophen (TYLENOL) 500 MG tablet, Take 1,000 mg by mouth every 6 (six) hours as needed (for pain.)., Disp: , Rfl:  .  amphetamine-dextroamphetamine (ADDERALL) 20 MG tablet, Take 20 mg by mouth daily., Disp: , Rfl:  .  Biotin 1000 MCG tablet, Take 1,000 mcg by mouth 2 (two) times daily., Disp: , Rfl:  .  buPROPion (WELLBUTRIN XL) 300 MG 24 hr tablet, Take 300 mg by mouth daily., Disp: , Rfl:  .  Calcium Carbonate Antacid (ANTACID PO), Take 500 mg by mouth 3 (three) times daily. , Disp: , Rfl:  .  clonazePAM (KLONOPIN) 0.5 MG tablet, Take 0.5-1 mg by mouth 3 (three) times daily as needed for sleep or anxiety. .  cyclobenzaprine (FLEXERIL) 10 MG tablet, Take 10 mg by mouth at bedtime., Disp: , Rfl: 2 .  lamoTRIgine (LAMICTAL) 200 MG tablet, Take 200 mg by mouth every evening. , Disp: , Rfl:  .  loratadine (CLARITIN) 10 MG tablet, Take 10 mg by mouth every evening., Disp: , Rfl:  .  Meth-Hyo-M Bl-Na Phos-Ph Sal (URIBEL) 118 MG CAPS, One po QID prn bladder spasm, Disp: 30 capsule, Rfl: 0 .  Multiple Vitamins-Minerals (CELEBRATE MULTI-COMPLETE 18 PO), Take by mouth., Disp: , Rfl:  .  omeprazole (PRILOSEC) 40 MG capsule, Take 40 mg by mouth daily., Disp: , Rfl:  .  PREDNISONE PO, Take by mouth., Disp: , Rfl:      Allergies  Allergen Reactions  . Theophylline     Other reaction(s): Other (See Comments) Hyperactive  "Slo phylinne"  . Penicillins Rash    Has patient had a PCN reaction causing immediate rash, facial/tongue/throat swelling, SOB or lightheadedness with hypotension:unsure Has patient had a PCN reaction causing severe rash involving mucus membranes or skin necrosis:unsure Has patient had a PCN reaction that required hospitalization:No Has patient had a PCN reaction occurring within the last 10 years: No If all of the above answers are "NO", then may proceed with Cephalosporin use.   . Pseudoephedrine Rash  . Sulfamethoxazole-Trimethoprim Rash   Review of Systems   Pertinent items are noted in the HPI. Otherwise, ROS is negative.  Vitals:   Vitals:   12/24/17 0952  BP: 112/66  Pulse: (!) 58  Temp: 98 F (36.7 C)  TempSrc: Oral  SpO2: 98%  Weight: 244 lb 3.2 oz (110.8 kg)  Height: 5\' 9"  (1.753 m)     Body mass index is 36.06 kg/m.  Physical Exam:   Physical Exam  Constitutional: She appears well-nourished.  HENT:  Head: Normocephalic and atraumatic.  Eyes: Pupils are equal, round, and reactive to light. EOM are normal.  Neck: Normal range of motion. Neck supple.  Cardiovascular: Normal rate, regular rhythm, normal heart sounds and intact distal pulses.  Pulmonary/Chest: Effort normal.  Abdominal: Soft.  Skin: Skin is warm.  Psychiatric: She has a normal mood and affect. Her behavior is normal.  Nursing note and vitals reviewed.  Assessment and Plan:   Yvette White was seen today for follow-up.  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 On chronic prednisone.  Gastroesophageal reflux disease without esophagitis -     ranitidine (ZANTAC) 150 MG tablet; Take 1 tablet (150 mg total) by mouth at bedtime.  Vitamin D deficiency -     VITAMIN D 25 Hydroxy (Vit-D Deficiency, Fractures)    . Reviewed expectations re: course of current medical issues. . Discussed self-management of symptoms. . Outlined signs and symptoms indicating need for more acute intervention. . Patient verbalized understanding and all questions were answered. Marland Kitchen Health Maintenance issues including appropriate healthy diet, exercise, and smoking avoidance were discussed with patient. . See orders for this visit as documented in the electronic medical record. . Patient received an After Visit Summary.  Briscoe Deutscher, DO , [facility] 12/29/2017  Future Appointments  Date Time Provider [place]  02/06/2018  2:00 PM [name], MD [facility] None  04/02/2018  1:40 PM Briscoe Deutscher, DO [facility]   CMA served as scribe during this visit. History, Physical, and Plan performed by medical provider. The above documentation has been reviewed and is accurate and complete. Briscoe Deutscher, D.O.

## 2017-12-24 ENCOUNTER — Ambulatory Visit: Payer: BLUE CROSS/BLUE SHIELD | Admitting: Family Medicine

## 2017-12-24 ENCOUNTER — Encounter: Payer: Self-pay | Admitting: Family Medicine

## 2017-12-24 VITALS — BP 112/66 | HR 58 | Temp 98.0°F | Ht 69.0 in | Wt 244.2 lb

## 2017-12-24 DIAGNOSIS — R300 Dysuria: Secondary | ICD-10-CM

## 2017-12-24 DIAGNOSIS — H5711 Ocular pain, right eye: Secondary | ICD-10-CM

## 2017-12-24 DIAGNOSIS — E559 Vitamin D deficiency, unspecified: Secondary | ICD-10-CM | POA: Diagnosis not present

## 2017-12-24 DIAGNOSIS — Z9884 Bariatric surgery status: Secondary | ICD-10-CM | POA: Diagnosis not present

## 2017-12-24 DIAGNOSIS — R5383 Other fatigue: Secondary | ICD-10-CM | POA: Diagnosis not present

## 2017-12-24 DIAGNOSIS — K219 Gastro-esophageal reflux disease without esophagitis: Secondary | ICD-10-CM

## 2017-12-24 DIAGNOSIS — D508 Other iron deficiency anemias: Secondary | ICD-10-CM

## 2017-12-24 LAB — POCT URINALYSIS DIPSTICK
Bilirubin, UA: NEGATIVE
Blood, UA: NEGATIVE
Glucose, UA: NEGATIVE
Ketones, UA: NEGATIVE
Leukocytes, UA: NEGATIVE
Nitrite, UA: NEGATIVE
Protein, UA: NEGATIVE
Spec Grav, UA: 1.015 (ref 1.010–1.025)
Urobilinogen, UA: 0.2 E.U./dL
pH, UA: 6 (ref 5.0–8.0)

## 2017-12-24 LAB — COMPREHENSIVE METABOLIC PANEL
ALT: 15 U/L (ref 0–35)
AST: 9 U/L (ref 0–37)
Albumin: 3.8 g/dL (ref 3.5–5.2)
Alkaline Phosphatase: 70 U/L (ref 39–117)
BUN: 16 mg/dL (ref 6–23)
CO2: 31 mEq/L (ref 19–32)
Calcium: 9.1 mg/dL (ref 8.4–10.5)
Chloride: 101 mEq/L (ref 96–112)
Creatinine, Ser: 0.91 mg/dL (ref 0.40–1.20)
GFR: 70.89 mL/min (ref >60.00)
Glucose, Bld: 75 mg/dL (ref 70–99)
Potassium: 3.8 mEq/L (ref 3.5–5.1)
Sodium: 141 mEq/L (ref 135–145)
Total Bilirubin: 0.8 mg/dL (ref 0.2–1.2)
Total Protein: 6.1 g/dL (ref 6.0–8.3)

## 2017-12-24 LAB — CBC WITH DIFFERENTIAL/PLATELET
Basophils Absolute: 0 10*3/uL (ref 0.0–0.1)
Basophils Relative: 0.3 % (ref 0.0–3.0)
Eosinophils Absolute: 0.1 10*3/uL (ref 0.0–0.7)
Eosinophils Relative: 1.1 % (ref 0.0–5.0)
HCT: 40.3 % (ref 36.0–46.0)
Hemoglobin: 13.7 g/dL (ref 12.0–15.0)
Lymphocytes Relative: 26.9 % (ref 12.0–46.0)
Lymphs Abs: 3.4 10*3/uL (ref 0.7–4.0)
MCHC: 34 g/dL (ref 30.0–36.0)
MCV: 88.3 fl (ref 78.0–100.0)
Monocytes Absolute: 0.8 10*3/uL (ref 0.1–1.0)
Monocytes Relative: 6 % (ref 3.0–12.0)
Neutro Abs: 8.3 10*3/uL — ABNORMAL HIGH (ref 1.4–7.7)
Neutrophils Relative %: 65.7 % (ref 43.0–77.0)
Platelets: 241 10*3/uL (ref 150.0–400.0)
RBC: 4.57 Mil/uL (ref 3.87–5.11)
RDW: 12.9 % (ref 11.5–15.5)
WBC: 12.7 10*3/uL — ABNORMAL HIGH (ref 4.0–10.5)

## 2017-12-24 LAB — LIPID PANEL
Cholesterol: 151 mg/dL (ref 0–200)
HDL: 68.5 mg/dL (ref >39.00)
LDL Cholesterol: 68 mg/dL (ref 0–99)
NonHDL: 82.48
Total CHOL/HDL Ratio: 2
Triglycerides: 70 mg/dL (ref 0.0–149.0)
VLDL: 14 mg/dL (ref 0.0–40.0)

## 2017-12-24 LAB — URINALYSIS, MICROSCOPIC ONLY: RBC / HPF: NONE SEEN (ref 0–?)

## 2017-12-24 LAB — VITAMIN B12: Vitamin B-12: 492 pg/mL (ref 211–911)

## 2017-12-24 LAB — VITAMIN D 25 HYDROXY (VIT D DEFICIENCY, FRACTURES): VITD: 28.84 ng/mL — ABNORMAL LOW (ref 30.00–100.00)

## 2017-12-24 LAB — TSH: TSH: 1.89 u[IU]/mL (ref 0.35–4.50)

## 2017-12-24 MED ORDER — RANITIDINE HCL 150 MG PO TABS: 150.0000 mg | ORAL_TABLET | Freq: Every day | ORAL | 1 refills | Status: DC

## 2017-12-24 MED ORDER — URIBEL 118 MG PO CAPS: ORAL_CAPSULE | ORAL | 0 refills | Status: DC

## 2017-12-25 DIAGNOSIS — F4323 Adjustment disorder with mixed anxiety and depressed mood: Secondary | ICD-10-CM | POA: Diagnosis not present

## 2017-12-25 LAB — URINE CULTURE
MICRO NUMBER:: 90893292
SPECIMEN QUALITY:: ADEQUATE

## 2017-12-25 LAB — IRON,TIBC AND FERRITIN PANEL
%SAT: 33 % (calc) (ref 16–45)
Ferritin: 39 ng/mL (ref 16–232)
Iron: 106 ug/dL (ref 40–190)
TIBC: 320 mcg/dL (calc) (ref 250–450)

## 2017-12-27 DIAGNOSIS — H0510 Unspecified chronic inflammatory disorders of orbit: Secondary | ICD-10-CM | POA: Diagnosis not present

## 2017-12-27 DIAGNOSIS — Z0389 Encounter for observation for other suspected diseases and conditions ruled out: Secondary | ICD-10-CM | POA: Diagnosis not present

## 2017-12-27 DIAGNOSIS — Z713 Dietary counseling and surveillance: Secondary | ICD-10-CM | POA: Diagnosis not present

## 2017-12-28 DIAGNOSIS — Z713 Dietary counseling and surveillance: Secondary | ICD-10-CM | POA: Diagnosis not present

## 2017-12-28 DIAGNOSIS — F4323 Adjustment disorder with mixed anxiety and depressed mood: Secondary | ICD-10-CM | POA: Diagnosis not present

## 2017-12-29 ENCOUNTER — Encounter: Payer: Self-pay | Admitting: Family Medicine

## 2017-12-31 DIAGNOSIS — H0510 Unspecified chronic inflammatory disorders of orbit: Secondary | ICD-10-CM | POA: Diagnosis not present

## 2017-12-31 DIAGNOSIS — G43009 Migraine without aura, not intractable, without status migrainosus: Secondary | ICD-10-CM | POA: Insufficient documentation

## 2018-01-01 DIAGNOSIS — H0510 Unspecified chronic inflammatory disorders of orbit: Secondary | ICD-10-CM | POA: Diagnosis not present

## 2018-01-01 DIAGNOSIS — H532 Diplopia: Secondary | ICD-10-CM | POA: Insufficient documentation

## 2018-01-01 DIAGNOSIS — H5022 Vertical strabismus, left eye: Secondary | ICD-10-CM | POA: Insufficient documentation

## 2018-01-03 DIAGNOSIS — Z713 Dietary counseling and surveillance: Secondary | ICD-10-CM | POA: Diagnosis not present

## 2018-01-07 DIAGNOSIS — Z713 Dietary counseling and surveillance: Secondary | ICD-10-CM | POA: Diagnosis not present

## 2018-01-10 DIAGNOSIS — Z713 Dietary counseling and surveillance: Secondary | ICD-10-CM | POA: Diagnosis not present

## 2018-01-14 DIAGNOSIS — Z713 Dietary counseling and surveillance: Secondary | ICD-10-CM | POA: Diagnosis not present

## 2018-01-17 DIAGNOSIS — F4323 Adjustment disorder with mixed anxiety and depressed mood: Secondary | ICD-10-CM | POA: Diagnosis not present

## 2018-01-18 DIAGNOSIS — Z713 Dietary counseling and surveillance: Secondary | ICD-10-CM | POA: Diagnosis not present

## 2018-01-21 DIAGNOSIS — F3181 Bipolar II disorder: Secondary | ICD-10-CM | POA: Diagnosis not present

## 2018-01-21 DIAGNOSIS — Z713 Dietary counseling and surveillance: Secondary | ICD-10-CM | POA: Diagnosis not present

## 2018-01-21 DIAGNOSIS — F331 Major depressive disorder, recurrent, moderate: Secondary | ICD-10-CM | POA: Diagnosis not present

## 2018-01-21 DIAGNOSIS — F411 Generalized anxiety disorder: Secondary | ICD-10-CM | POA: Diagnosis not present

## 2018-01-22 DIAGNOSIS — F4323 Adjustment disorder with mixed anxiety and depressed mood: Secondary | ICD-10-CM | POA: Diagnosis not present

## 2018-01-23 DIAGNOSIS — H02843 Edema of right eye, unspecified eyelid: Secondary | ICD-10-CM | POA: Diagnosis not present

## 2018-01-23 DIAGNOSIS — H0510 Unspecified chronic inflammatory disorders of orbit: Secondary | ICD-10-CM | POA: Diagnosis not present

## 2018-01-23 DIAGNOSIS — H02401 Unspecified ptosis of right eyelid: Secondary | ICD-10-CM | POA: Diagnosis not present

## 2018-01-24 DIAGNOSIS — Z713 Dietary counseling and surveillance: Secondary | ICD-10-CM | POA: Diagnosis not present

## 2018-01-24 DIAGNOSIS — F4323 Adjustment disorder with mixed anxiety and depressed mood: Secondary | ICD-10-CM | POA: Diagnosis not present

## 2018-01-30 DIAGNOSIS — R12 Heartburn: Secondary | ICD-10-CM | POA: Diagnosis not present

## 2018-01-30 DIAGNOSIS — Z9884 Bariatric surgery status: Secondary | ICD-10-CM | POA: Diagnosis not present

## 2018-01-31 DIAGNOSIS — Z713 Dietary counseling and surveillance: Secondary | ICD-10-CM | POA: Diagnosis not present

## 2018-02-04 ENCOUNTER — Encounter: Payer: Self-pay | Admitting: Neurology

## 2018-02-05 ENCOUNTER — Telehealth: Payer: Self-pay

## 2018-02-05 DIAGNOSIS — F4323 Adjustment disorder with mixed anxiety and depressed mood: Secondary | ICD-10-CM | POA: Diagnosis not present

## 2018-02-05 NOTE — Telephone Encounter (Signed)
I called pt. There is no data from her cpap for the past 3 months. If pt is still using her machine, she will need to bring it to her appt tomorrow with Dr. Rexene Alberts.  No answer, left a message asking her to call me back. If pt calls back, please clarify this with her.

## 2018-02-06 ENCOUNTER — Encounter: Payer: Self-pay | Admitting: Neurology

## 2018-02-06 ENCOUNTER — Ambulatory Visit: Payer: BLUE CROSS/BLUE SHIELD | Admitting: Neurology

## 2018-02-06 VITALS — BP 151/70 | HR 88 | Ht 65.0 in | Wt 253.0 lb

## 2018-02-06 DIAGNOSIS — R634 Abnormal weight loss: Secondary | ICD-10-CM | POA: Diagnosis not present

## 2018-02-06 DIAGNOSIS — Z9884 Bariatric surgery status: Secondary | ICD-10-CM | POA: Diagnosis not present

## 2018-02-06 DIAGNOSIS — G4730 Sleep apnea, unspecified: Secondary | ICD-10-CM

## 2018-02-06 NOTE — Progress Notes (Signed)
Subjective:    Patient ID: Yvette White is a 47 y.o. female.  HPI     Interim history:   Yvette White is a 47 year old right-handed woman with an underlying medical history of vitamin D deficiency, history of syncope, reflux disease, hypothyroidism, depression, ADHD and morbid obesity, who presents for follow-up consultation of her obstructive sleep apnea, on treatment with AutoPap. The patient is unaccompanied today. I last saw her on 04/26/2017, at which time she was compliant with her AutoPap therapy. She had some difficulty with the mask and with humidity setting. Her headaches had improved after starting AutoPap. She had some right sided eye pain and retro-orbital pain and periorbital pain, was on steroids but was advised by her bariatric surgeon that she would be at risk if she were on long-term steroids because of healing issues and risk for ulcer. She was seen by ophthalmology, she had laparoscopic bariatric surgery in September 2018. She was diagnosed with an inflammatory eye disease and was placed on steroids by her ophthalmologist. Her eye pain improved after she started the steroids.  Today, 02/06/2018: I reviewed her AutoPap compliance data from the last 6 months from 08/09/2017 through 02/04/2018, during which time she used her machine 71 days. She reports that she stopped using her AutoPap because she felt he did not need it any longer. She has lost weight after her weight loss surgery last year. She does not snore to any significant degree any longer. She overall feels better after her weight loss. She takes her supplements as prescribed and as instructed.    She had a CT orbits with IV contrast on 04/11/2017 and I reviewed the results: IMPRESSION:   1.   Apparent mild retrobulbar stranding on the right especially extending about the lateral aspect of the optic nerve. 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   2.  No evidence  of preseptal or postseptal collection or mass.   3.  Normal appearance of the globes and extraocular muscles.      The patient's allergies, current medications, family history, past medical history, past social history, past surgical history and problem list were reviewed and updated as appropriate.    Previously (copied from previous notes for reference):   I first met her on 06/27/2016 at the request of her primary care provider, at which time the patient reported snoring and excessive daytime somnolence. I suggested we proceed with sleep study testing. Her insurance denied and attended sleep study. She had a home sleep test on 09/13/2016 which showed an AHI of 6.2 per hour, average oxygen saturation of 94%, nadir of 86%. She was advised to start AutoPap therapy.   I reviewed her AutoPap compliance data from 03/26/2017 through 04/24/2017 which is a total of 30 days, during which time she used her machine every night with percent used days greater than 4 hours at 97%, indicating excellent compliance with an average usage of 7 hours and 52 minutes, residual AHI 6.6 per hour, 95th percentile at 10.4 cm, leak very low with the 95th percentile at 0.1 L/m on a pressure range of 6-12 cm with EPR.    06/27/2016: (She) reports snoring and excessive daytime somnolence. I reviewed your office note from 03/06/2016, which you kindly included. She has a hx of syncope and has sleep studies in the remote past. Last sleep study was about 10 years ago, per patient neg. for OSA. She sees a psychiatrist for depression. She is hoping to wean off  her medication. She has been able to come off some of her medications. She needs at least 8 hours of sleep to feel rested. 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 Her mother and her maternal aunt have obstructive  sleep apnea and uses CPAP machine. Patient is single, has no children, lives with mom, sister and niece. She is known to snore and also endorses waking up with a sense of gasping for air. She also has reflux symptoms at night and is supposed to see a gastroenterologist. She is also considering weight loss surgery. She quit smoking in May 2016, takes alcohol about 5 glasses a month, caffeine 1-2 cups daily. She denies any frank restless leg symptoms or leg twitching at night. She tries to make sure she gets about 8 hours of sleep on any given night. She has a history of syncope, thankfully none and about 9 or 10 years, will reestablish care with her cardiologist. She had an abnormal tilt table test in the past. She has nearly daily AM HAs, no meds for it, last about 3 hours, non migrainous, used to have migraines as a child.  She feels tired during the day, Epworth sleepiness score is 6 out of 24 today, her fatigue score is 39 out of 63.   Her Past Medical History Is Significant For: Past Medical History:  Diagnosis Date  . ADD (attention deficit disorder), controlled with Adderall   . Anemia   . Anxiety   . Arthritis   . Chicken pox   . Depression   . Disorder of thyroid   . GERD (gastroesophageal reflux disease)   . Heart murmur   . Hyperthyroidism   . Menstrual disorder    uterine fibriods, polyps,cysts  . Migraine   . Morbid obesity ([place])   . Neurocardiogenic syncope   . OSA on CPAP   . Pre-diabetes   . Swallowing difficulty   . Vitamin D deficiency     Her Past Surgical History Is Significant For: Past Surgical History:  Procedure Laterality Date  . DILATATION & CURETTAGE/HYSTEROSCOPY WITH MYOSURE N/A 06/08/2016   Procedure: DILATATION & CURETTAGE/HYSTEROSCOPY;  Surgeon: Louretta Shorten, MD;  Location: [facility];  Service: Gynecology;  Laterality: N/A;  . LAPAROSCOPIC GASTRIC SLEEVE RESECTION N/A 02/20/2017   Procedure: LAPAROSCOPIC GASTRIC SLEEVE RESECTION WITH UPPER ENDOSCOPY;  Surgeon:  Johnathan Hausen, MD;  Location: [facility];  Service: General;  Laterality: N/A;  . TILT TABLE STUDY    . URETHRAL DILATION  1979  . WISDOM TOOTH EXTRACTION      Her Family History Is Significant For: Family History  Problem Relation Age of Onset  . Diabetes Mother   . Heart disease Mother   . Uterine cancer Mother   . Hypertension Mother   . Hyperlipidemia Mother   . Kidney disease Mother   . Thyroid disease Mother   . Cancer Mother   . Depression Mother   . Sleep apnea Mother   . Obesity Mother   . Heart attack Father 59  . Diabetes Father   . Heart disease Father   . Hypertension Father   . Hyperlipidemia Father   . Obesity Father   . Atrial fibrillation Maternal Grandmother   . Parkinson's disease Maternal Grandmother   . Colon cancer Maternal Grandfather 73  . Heart disease Paternal Grandmother   .  Heart disease Paternal Grandfather     Her Social History Is Significant For: Social History   Socioeconomic History  . Marital status: Single    Spouse name: Not on file  . Number of children: 0  . Years of education: BA  . Highest education level: Not on file  Occupational History  . Occupation: Freight forwarder, Geophysicist/field seismologist, Instructor  Social Needs  . Financial resource strain: Not on file  . Food insecurity:    Worry: Not on file    Inability: Not on file  . Transportation needs:    Medical: Not on file    Non-medical: Not on file  Tobacco Use  . Smoking status: Former Smoker    Packs/day: 0.25    Years: 20.00    Pack years: 5.00    Types: Cigarettes    Last attempt to quit: 09/30/2014    Years since quitting: 3.3  . Smokeless tobacco: Never Used  Substance and Sexual Activity  . Alcohol use: Yes    Comment: occ  . Drug use: No  . Sexual activity: Not on file  Lifestyle  . Physical activity:    Days per week: Not on file    Minutes per session: Not on file  . Stress: Not on file  Relationships  . Social connections:    Talks on phone: Not on file     Gets together: Not on file    Attends religious service: Not on file    Active member of club or organization: Not on file    Attends meetings of clubs or organizations: Not on file    Relationship status: Not on file  Other Topics Concern  . Not on file  Social History Narrative   Drinks 1-2 caffeine drinks a day. 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     Her Allergies Are:  Allergies  Allergen Reactions  . Theophylline     Other reaction(s): Other (See Comments) Hyperactive  "Slo phylinne"  . Penicillins Rash    Has patient had a PCN reaction causing immediate rash, facial/tongue/throat swelling, SOB or lightheadedness with hypotension:unsure Has patient had a PCN reaction causing severe rash involving mucus membranes or skin necrosis:unsure Has patient had a PCN reaction that required hospitalization:No Has patient had a PCN reaction occurring within the last 10 years: No If all of the above answers are "NO", then may proceed with Cephalosporin use.   . Pseudoephedrine Rash  . Sulfamethoxazole-Trimethoprim Rash  :   Her Current Medications Are:  Outpatient Encounter Medications as of 02/06/2018  Medication Sig  . acetaminophen (TYLENOL) 500 MG tablet Take 1,000 mg by mouth every 6 (six) hours as needed (for pain.).  Marland Kitchen Biotin 1000 MCG tablet Take 1,000 mcg by mouth 2 (two) times daily.  Marland Kitchen buPROPion (WELLBUTRIN XL) 150 MG 24 hr tablet Take 150 mg by mouth daily.  . Calcium Carbonate Antacid (ANTACID PO) Take 500 mg by mouth 3 (three) times daily.   . clonazePAM (KLONOPIN) 0.5 MG tablet Take 0.5-1 mg by mouth 3 (three) times daily as needed for sleep or anxiety.  . cyclobenzaprine (FLEXERIL) 10 MG tablet Take 10 mg by mouth at bedtime.  . lamoTRIgine (LAMICTAL) 200 MG tablet Take 200 mg by mouth every evening.   . loratadine (CLARITIN) 10 MG tablet Take 10 mg by mouth every evening.  . Meth-Hyo-M Bl-Na Phos-Ph Sal (URIBEL) 118 MG CAPS  One po QID prn bladder spasm  . Methotrexate Sodium (  METHOTREXATE, PF,) 50 MG/2ML injection once a week.  . Multiple Vitamins-Minerals (CELEBRATE MULTI-COMPLETE 18 PO) Take by mouth.  Marland Kitchen omeprazole (PRILOSEC) 40 MG capsule Take 40 mg by mouth daily.  . pantoprazole (PROTONIX) 40 MG tablet Take 40 mg by mouth daily.  Marland Kitchen PREDNISONE PO Take by mouth.  . [DISCONTINUED] famotidine (PEPCID) 20 MG tablet Take 20 mg by mouth daily.  . [DISCONTINUED] ranitidine (ZANTAC) 150 MG tablet Take 1 tablet (150 mg total) by mouth at bedtime.  . [DISCONTINUED] buPROPion (WELLBUTRIN XL) 300 MG 24 hr tablet Take 300 mg by mouth daily.   Facility-Administered Encounter Medications as of 02/06/2018  Medication  . regadenoson (LEXISCAN) injection SOLN 0.4 mg  :  Review of Systems:  Out of a complete 14 point review of systems, all are reviewed and negative with the exception of these symptoms as listed below: Review of Systems  Neurological:       Pt presents today to discuss her cpap. Pt has stopped using her cpap after her bariatric surgery because she feels as if she does not need it any longer.    Objective:  Neurological Exam  Physical Exam Physical Examination:   Vitals:   02/06/18 1324  BP: (!) 151/70  Pulse: 88    General Examination: The patient is a very pleasant 47 y.o. female in no acute distress. She appears well-developed and well-nourished and well groomed. Good spirits.    HEENT:Normocephalic, atraumatic, pupils are equal, round and reactive to light and accommodation. She wears corrective eyeglasses.Extraocular tracking is good, she has no nystagmus, she denies double vision. She has no conjunctival or scleral irritation. Hearing is intact. Airway examination reveals mild mouth dryness, unchanged findings, smaller airway entry, small nasal anatomy as well. Tongue protrudes centrally and palate elevates symmetrically.   Chest:Clear to auscultation without wheezing, rhonchi or crackles  noted.  Heart:S1+S2+0, regular and normal without murmurs, rubs or gallops noted.   Abdomen:Soft, non-tender and non-distended with normal bowel sounds appreciated on auscultation.  Extremities:There isnopitting edema in the distal lower extremities bilaterally.  Skin: Warm and dry without trophic changes noted.   Musculoskeletal: exam reveals no obvious joint deformities, tenderness or joint swelling or erythema.   Neurologically:  Mental status: The patient is awake, alert and oriented in all 4 spheres.Herimmediate and remote memory, attention, language skills and fund of knowledge are appropriate. There is no evidence of aphasia, agnosia, apraxia or anomia. Speech is clear with normal prosody and enunciation. Thought process is linear. Mood is normaland affect is normal.  Cranial nerves II - XII are as described above under HEENT exam.  Motor exam: Normal bulk, strength and tone is noted. There is no tremor, fine motor skills and coordination are grossly intact.  Cerebellar testing: No dysmetria or intention tremor.  Sensory exam: intact to light touch.  Gait, station and balance:Shestands easily. No veering to one side is noted. No leaning to one side is noted. Posture is age-appropriate and stance is narrow based. Gait showsnormalstride length and normalpace. No problems turning are noted.   Assessmentand Plan:  In summary,Yvette J Robertsis a very pleasant 66 year oldfemalewith an underlying medical history of vitamin D deficiency, history of syncope, reflux disease, hypothyroidism, depression, ADHD and morbid obesity, who presents for follow-up consultation of her overall mild OSA, for which she has been on AutoPap therapy. She had a home sleep testing in April 2018 prior to her weight loss surgery. She had weight loss surgery in September 2018 and has since then lost  a significant amount of weight. She was fully compliant with AutoPap therapy but with time as she  had lost weight she noticed that she did not feel the need for the AutoPap any longer. She quit using her AutoPap in June 2019. She is advised that I would like to proceed with a repeat home sleep test, so to have objective data confirms that she has no residual sleep disorder breathing. She is advised that we should call her from the sleep lab to arrange for home sleep test equipment pickup and we will call her with her test results and hopefully at that point forego treatment with AutoPap therapy officially and she can also talked to her DME company at that time regarding returning the machine. I suggested as needed follow-up with me at this juncture. I answered all her questions today and she was in agreement. I spent 20 minutes in total face-to-face time with the patient, more than 50% of which was spent in counseling and coordination of care, reviewing test results, reviewing medication and discussing or reviewing the diagnosis of OSA, its prognosis and treatment options. Pertinent laboratory and imaging test results that were available during this visit with the patient were reviewed by me and considered in my medical decision making (see chart for details).

## 2018-02-06 NOTE — Patient Instructions (Signed)
As discussed, I will request a repeat home sleep test to confirm improvement of you overall mild sleep apnea with your weight loss.  If you no longer have sleep apnea, we may be able to forego autoPAP therapy and you may be able to return your machine to [facility].  I am happy, you are feeling better! I would be happy to see you back as needed.

## 2018-02-07 DIAGNOSIS — Z713 Dietary counseling and surveillance: Secondary | ICD-10-CM | POA: Diagnosis not present

## 2018-02-07 DIAGNOSIS — F4323 Adjustment disorder with mixed anxiety and depressed mood: Secondary | ICD-10-CM | POA: Diagnosis not present

## 2018-02-12 DIAGNOSIS — F4323 Adjustment disorder with mixed anxiety and depressed mood: Secondary | ICD-10-CM | POA: Diagnosis not present

## 2018-02-14 DIAGNOSIS — Z713 Dietary counseling and surveillance: Secondary | ICD-10-CM | POA: Diagnosis not present

## 2018-02-15 DIAGNOSIS — F4323 Adjustment disorder with mixed anxiety and depressed mood: Secondary | ICD-10-CM | POA: Diagnosis not present

## 2018-02-19 DIAGNOSIS — F4323 Adjustment disorder with mixed anxiety and depressed mood: Secondary | ICD-10-CM | POA: Diagnosis not present

## 2018-02-21 DIAGNOSIS — Z713 Dietary counseling and surveillance: Secondary | ICD-10-CM | POA: Diagnosis not present

## 2018-02-22 DIAGNOSIS — H0510 Unspecified chronic inflammatory disorders of orbit: Secondary | ICD-10-CM | POA: Diagnosis not present

## 2018-02-26 DIAGNOSIS — Z713 Dietary counseling and surveillance: Secondary | ICD-10-CM | POA: Diagnosis not present

## 2018-02-26 DIAGNOSIS — F4323 Adjustment disorder with mixed anxiety and depressed mood: Secondary | ICD-10-CM | POA: Diagnosis not present

## 2018-03-04 ENCOUNTER — Telehealth: Payer: Self-pay | Admitting: Neurology

## 2018-03-04 NOTE — Telephone Encounter (Signed)
We have attempted to call the patient 2 times to schedule sleep study. Patient has been unavailable at the phone numbers we have on file and has not returned our calls. At this point we will send a letter asking pt to please contact the sleep lab to schedule their sleep study. If patient calls back we will schedule them for their sleep study. ° °

## 2018-03-06 DIAGNOSIS — F4323 Adjustment disorder with mixed anxiety and depressed mood: Secondary | ICD-10-CM | POA: Diagnosis not present

## 2018-03-12 DIAGNOSIS — F4323 Adjustment disorder with mixed anxiety and depressed mood: Secondary | ICD-10-CM | POA: Diagnosis not present

## 2018-03-18 DIAGNOSIS — Z713 Dietary counseling and surveillance: Secondary | ICD-10-CM | POA: Diagnosis not present

## 2018-03-20 DIAGNOSIS — F4323 Adjustment disorder with mixed anxiety and depressed mood: Secondary | ICD-10-CM | POA: Diagnosis not present

## 2018-03-26 DIAGNOSIS — F4323 Adjustment disorder with mixed anxiety and depressed mood: Secondary | ICD-10-CM | POA: Diagnosis not present

## 2018-04-02 ENCOUNTER — Encounter: Payer: Self-pay | Admitting: Family Medicine

## 2018-04-02 ENCOUNTER — Ambulatory Visit: Payer: BLUE CROSS/BLUE SHIELD | Admitting: Family Medicine

## 2018-04-02 VITALS — BP 114/76 | HR 64 | Temp 98.3°F | Ht 65.0 in | Wt 262.4 lb

## 2018-04-02 DIAGNOSIS — E8881 Metabolic syndrome: Secondary | ICD-10-CM | POA: Diagnosis not present

## 2018-04-02 DIAGNOSIS — F4323 Adjustment disorder with mixed anxiety and depressed mood: Secondary | ICD-10-CM | POA: Diagnosis not present

## 2018-04-02 DIAGNOSIS — H0510 Unspecified chronic inflammatory disorders of orbit: Secondary | ICD-10-CM | POA: Diagnosis not present

## 2018-04-02 DIAGNOSIS — Z6841 Body Mass Index (BMI) 40.0 and over, adult: Secondary | ICD-10-CM | POA: Diagnosis not present

## 2018-04-02 DIAGNOSIS — E88819 Insulin resistance, unspecified: Secondary | ICD-10-CM

## 2018-04-02 DIAGNOSIS — Z79899 Other long term (current) drug therapy: Secondary | ICD-10-CM | POA: Diagnosis not present

## 2018-04-02 LAB — CBC WITH DIFFERENTIAL/PLATELET
Basophils Absolute: 0 10*3/uL (ref 0.0–0.1)
Basophils Relative: 0.3 % (ref 0.0–3.0)
Eosinophils Absolute: 0 10*3/uL (ref 0.0–0.7)
Eosinophils Relative: 0.1 % (ref 0.0–5.0)
HCT: 39.8 % (ref 36.0–46.0)
Hemoglobin: 13.6 g/dL (ref 12.0–15.0)
Lymphocytes Relative: 9.1 % — ABNORMAL LOW (ref 12.0–46.0)
Lymphs Abs: 1 10*3/uL (ref 0.7–4.0)
MCHC: 34.1 g/dL (ref 30.0–36.0)
MCV: 93.3 fl (ref 78.0–100.0)
Monocytes Absolute: 0.3 10*3/uL (ref 0.1–1.0)
Monocytes Relative: 2.6 % — ABNORMAL LOW (ref 3.0–12.0)
Neutro Abs: 9.3 10*3/uL — ABNORMAL HIGH (ref 1.4–7.7)
Neutrophils Relative %: 87.9 % — ABNORMAL HIGH (ref 43.0–77.0)
Platelets: 324 10*3/uL (ref 150.0–400.0)
RBC: 4.27 Mil/uL (ref 3.87–5.11)
RDW: 13.9 % (ref 11.5–15.5)
WBC: 10.6 10*3/uL — ABNORMAL HIGH (ref 4.0–10.5)

## 2018-04-02 LAB — POCT GLYCOSYLATED HEMOGLOBIN (HGB A1C): Hemoglobin A1C: 5.3 % (ref 4.0–5.6)

## 2018-04-02 LAB — COMPREHENSIVE METABOLIC PANEL
ALT: 18 U/L (ref 0–35)
AST: 18 U/L (ref 0–37)
Albumin: 4.2 g/dL (ref 3.5–5.2)
Alkaline Phosphatase: 73 U/L (ref 39–117)
BUN: 12 mg/dL (ref 6–23)
CO2: 32 mEq/L (ref 19–32)
Calcium: 9.4 mg/dL (ref 8.4–10.5)
Chloride: 100 mEq/L (ref 96–112)
Creatinine, Ser: 0.82 mg/dL (ref 0.40–1.20)
GFR: 79.85 mL/min (ref >60.00)
Glucose, Bld: 121 mg/dL — ABNORMAL HIGH (ref 70–99)
Potassium: 4.4 mEq/L (ref 3.5–5.1)
Sodium: 138 mEq/L (ref 135–145)
Total Bilirubin: 0.6 mg/dL (ref 0.2–1.2)
Total Protein: 6.7 g/dL (ref 6.0–8.3)

## 2018-04-02 MED ORDER — SEMAGLUTIDE (1 MG/DOSE) 2 MG/1.5ML ~~LOC~~ SOPN: 1.0000 mg | PEN_INJECTOR | SUBCUTANEOUS | 6 refills | Status: DC

## 2018-04-02 NOTE — Progress Notes (Signed)
Yvette White is a 47 y.o. female is here for follow up.  History of Present Illness:   HPI:   1. Weight gain. Saw surgeon last month and was praised for doing well. Starting to exercise - 4 days per week, 2 days with trainor focusing on strength. Still seeing Dietician, mindful eating. Triggered some binging. Starting to decrease some foods again in order to lose weight again. 2. Idiopathic orbit inflammation. Followed by Ophthalmology. On MTX weekly and Prednisone 20 mg daily. Has been on Prednisone for one year.  3. Taking Bariatric MVM.   There are no preventive care reminders to display for this patient. Depression screen [facility] 2/9 12/24/2017 01/24/2017 01/18/2017  Decreased Interest 0 2 0  Down, Depressed, Hopeless 1 3 0  PHQ - 2 Score 1 5 0  Altered sleeping 2 3 -  Tired, decreased energy 1 3 -  Change in appetite 2 2 -  Feeling bad or failure about yourself  2 2 -  Trouble concentrating 1 1 -  Moving slowly or fidgety/restless 1 3 -  Suicidal thoughts 0 1 -  PHQ-9 Score 10 20 -  Difficult doing work/chores Somewhat difficult Very difficult -   PMHx, SurgHx, SocialHx, FamHx, Medications, and Allergies were reviewed in the Visit Navigator and updated as appropriate.   Patient Active Problem List   Diagnosis Date Noted  . Diplopia 01/01/2018  . 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 Migraine without aura and without status migrainosus, not intractable 12/31/2017  . Chronic thoracic back pain 09/27/2017  . Low back pain 09/27/2017  . Former smoker, quit in 2016 07/29/2017  . Low HDL (under 40) 07/29/2017  . Bladder spasms 07/29/2017  . History of abnormal mammogram 07/29/2017  . Ocular pain, right eye 07/29/2017  . Thyroid condition, history of low TSH that normalized with monitoring 07/29/2017  . High risk medication use - PREDNISONE x 3, s/p recent gastric sleeve 07/29/2017  . ADD (attention deficit disorder)   .  OSA on CPAP   . Migraine   . Vitamin D deficiency, on 50K IU weekly 05/02/2017  . Insulin resistance, A1c 5.5 on 07/25/17 05/02/2017  . 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 Bipolar 1 disorder ([place]), followed by Psych, controlled with Lamictal 01/24/2017  . Morbid obesity ([place]), s/p gastric sleeve, at time of surgery - weight 382, BMI 61 07/13/2016  . Abnormal menses 03/06/2016  . GERD (gastroesophageal reflux disease), on Prilosec 03/06/2016  . History of neurocardiogenic syncope, no episodes since 2017 03/06/2016  . Pharyngoesophageal dysphagia 03/06/2016  . Seasonal allergies, controlled with Claritin 08/25/2013   Social History   Tobacco Use  . Smoking status: Former Smoker    Packs/day: 0.25    Years: 20.00    Pack years: 5.00    Types: Cigarettes    Last attempt to quit: 09/30/2014    Years since quitting: 3.5  . Smokeless tobacco: Never Used  Substance Use Topics  . Alcohol use: Yes    Comment: occ  . Drug use: No   Current Medications and Allergies:   .  acetaminophen (TYLENOL) 500 MG tablet, Take 1,000 mg by mouth every 6 (six) hours as needed (for pain.)., Disp: , Rfl:  .  buPROPion (WELLBUTRIN XL) 150 MG 24 hr tablet, Take 150 mg by mouth daily., Disp: , Rfl:  .  Calcium Carbonate Antacid (ANTACID PO), Take 500 mg by mouth 3 (three) times daily. ,  Disp: , Rfl:  .  lamoTRIgine (LAMICTAL) 200 MG tablet, Take 200 mg by mouth every evening. , Disp: , Rfl:  .  loratadine (CLARITIN) 10 MG tablet, Take 10 mg by mouth every evening., Disp: , Rfl:  .  Meth-Hyo-M Bl-Na Phos-Ph Sal (URIBEL) 118 MG CAPS, One po QID prn bladder spasm, Disp: 30 capsule, Rfl: 0 .  Methotrexate Sodium (METHOTREXATE, PF,) 50 MG/2ML injection, once a week., Disp: , Rfl: 2 .  Multiple Vitamins-Minerals (CELEBRATE MULTI-COMPLETE 18 PO), Take by mouth., Disp: , Rfl:  .  pantoprazole (PROTONIX) 40 MG tablet, Take 40 mg by mouth daily., Disp: , Rfl: 3 .  PREDNISONE PO, Take by mouth.,  Disp: , Rfl:    Allergies  Allergen Reactions  . Theophylline     Other reaction(s): Other (See Comments) Hyperactive  "Slo phylinne"  . Penicillins Rash    Has patient had a PCN reaction causing immediate rash, facial/tongue/throat swelling, SOB or lightheadedness with hypotension:unsure Has patient had a PCN reaction causing severe rash involving mucus membranes or skin necrosis:unsure Has patient had a PCN reaction that required hospitalization:No Has patient had a PCN reaction occurring within the last 10 years: No If all of the above answers are "NO", then may proceed with Cephalosporin use.   . Pseudoephedrine Rash  . Sulfamethoxazole-Trimethoprim Rash   Review of Systems   Pertinent items are noted in the HPI. Otherwise, ROS is negative.  Vitals:   Vitals:   04/02/18 1340  BP: 114/76  Pulse: 64  Temp: 98.3 F (36.8 C)  TempSrc: Oral  SpO2: 97%  Weight: 262 lb 6.4 oz (119 kg)  Height: 5\' 5"  (1.651 m)     Body mass index is 43.67 kg/m.  Physical Exam:   Physical Exam  Constitutional: She appears well-nourished.  HENT:  Head: Normocephalic and atraumatic.  Eyes: Pupils are equal, round, and reactive to light. EOM are normal.  Neck: Normal range of motion. Neck supple.  Cardiovascular: Normal rate, regular rhythm, normal heart sounds and intact distal pulses.  Pulmonary/Chest: Effort normal.  Abdominal: Soft.  Skin: Skin is warm.  Psychiatric: She has a normal mood and affect. Her behavior is normal.  Nursing note and vitals reviewed.  Assessment and Plan:   Insulin resistance, A1c 5.5 on 07/25/17 History of insulin resistance, with signifant weight loss. Unfortunately, she has been on Prednisone for about one year. Starting to gain weight and showing signs of long-term steroid use. Plan: Yvette White will continue to work on weight loss, exercise, and decreasing simple carbohydrates in her diet to help decrease the risk of diabetes. We dicussed Ozempic, including  benefits and risks. She was informed that eating too many simple carbohydrates or too many calories at one sitting increases the likelihood of GI side effects.   Idiopathic orbital inflammatory syndrome Followed by Ophthalmology, on prednisone and MTX. No new symptoms. Will try to lessen steroid risks with Ozempic.   High risk medication use - PREDNISONE x 3, s/p recent gastric sleeve Current Outpatient Medications:  .  pantoprazole (PROTONIX) 40 MG tablet, Take 40 mg by mouth daily., Disp: , Rfl: 3 .  PREDNISONE PO, Take by mouth., Disp: , Rfl:  .  Semaglutide, 1 MG/DOSE, (OZEMPIC, 1 MG/DOSE,) 2 MG/1.5ML SOPN, Inject 1 mg into the skin once a week., Disp: 1 pen, Rfl: 6   Orders Placed This Encounter  Procedures  . CBC with Differential/Platelet  . Comprehensive metabolic panel  . Insulin, Free (Bioactive)  . POCT glycosylated hemoglobin (Hb  A1C)   Meds ordered this encounter  Medications  . Semaglutide, 1 MG/DOSE, (OZEMPIC, 1 MG/DOSE,) 2 MG/1.5ML SOPN    Sig: Inject 1 mg into the skin once a week.    Dispense:  1 pen    Refill:  6    . Reviewed expectations re: course of current medical issues. . Discussed self-management of symptoms. . Outlined signs and symptoms indicating need for more acute intervention. . Patient verbalized understanding and all questions were answered. Marland Kitchen Health Maintenance issues including appropriate healthy diet, exercise, and smoking avoidance were discussed with patient. . See orders for this visit as documented in the electronic medical record. . Patient received an After Visit Summary.  Briscoe Deutscher, DO [place] [place] 04/06/2018

## 2018-04-03 DIAGNOSIS — H0510 Unspecified chronic inflammatory disorders of orbit: Secondary | ICD-10-CM | POA: Diagnosis not present

## 2018-04-06 ENCOUNTER — Encounter: Payer: Self-pay | Admitting: Family Medicine

## 2018-04-06 NOTE — Assessment & Plan Note (Signed)
Followed by Ophthalmology, on prednisone and MTX. No new symptoms. Will try to lessen steroid risks with Ozempic.

## 2018-04-06 NOTE — Assessment & Plan Note (Signed)
Current Outpatient Medications:  .  pantoprazole (PROTONIX) 40 MG tablet, Take 40 mg by mouth daily., Disp: , Rfl: 3 .  PREDNISONE PO, Take by mouth., Disp: , Rfl:  .  Semaglutide, 1 MG/DOSE, (OZEMPIC, 1 MG/DOSE,) 2 MG/1.5ML SOPN, Inject 1 mg into the skin once a week., Disp: 1 pen, Rfl: 6

## 2018-04-06 NOTE — Assessment & Plan Note (Signed)
History of insulin resistance, with signifant weight loss. Unfortunately, she has been on Prednisone for about one year. Starting to gain weight and showing signs of long-term steroid use. Plan: Yvette White will continue to work on weight loss, exercise, and decreasing simple carbohydrates in her diet to help decrease the risk of diabetes. We dicussed Ozempic, including benefits and risks. She was informed that eating too many simple carbohydrates or too many calories at one sitting increases the likelihood of GI side effects.

## 2018-04-09 DIAGNOSIS — F4323 Adjustment disorder with mixed anxiety and depressed mood: Secondary | ICD-10-CM | POA: Diagnosis not present

## 2018-04-09 DIAGNOSIS — Z713 Dietary counseling and surveillance: Secondary | ICD-10-CM | POA: Diagnosis not present

## 2018-04-11 LAB — INSULIN, FREE (BIOACTIVE): Insulin, Free: 11.1 u[IU]/mL (ref 1.5–14.9)

## 2018-04-16 DIAGNOSIS — F4323 Adjustment disorder with mixed anxiety and depressed mood: Secondary | ICD-10-CM | POA: Diagnosis not present

## 2018-04-23 DIAGNOSIS — Z713 Dietary counseling and surveillance: Secondary | ICD-10-CM | POA: Diagnosis not present

## 2018-04-29 DIAGNOSIS — Z713 Dietary counseling and surveillance: Secondary | ICD-10-CM | POA: Diagnosis not present

## 2018-05-03 DIAGNOSIS — Z79899 Other long term (current) drug therapy: Secondary | ICD-10-CM | POA: Diagnosis not present

## 2018-05-03 DIAGNOSIS — H0510 Unspecified chronic inflammatory disorders of orbit: Secondary | ICD-10-CM | POA: Diagnosis not present

## 2018-05-03 DIAGNOSIS — Z7952 Long term (current) use of systemic steroids: Secondary | ICD-10-CM | POA: Diagnosis not present

## 2018-05-03 DIAGNOSIS — Z23 Encounter for immunization: Secondary | ICD-10-CM | POA: Diagnosis not present

## 2018-05-06 DIAGNOSIS — F4323 Adjustment disorder with mixed anxiety and depressed mood: Secondary | ICD-10-CM | POA: Diagnosis not present

## 2018-05-07 DIAGNOSIS — Z713 Dietary counseling and surveillance: Secondary | ICD-10-CM | POA: Diagnosis not present

## 2018-05-07 DIAGNOSIS — Z23 Encounter for immunization: Secondary | ICD-10-CM | POA: Diagnosis not present

## 2018-05-07 DIAGNOSIS — H0510 Unspecified chronic inflammatory disorders of orbit: Secondary | ICD-10-CM | POA: Diagnosis not present

## 2018-05-10 ENCOUNTER — Ambulatory Visit: Payer: BLUE CROSS/BLUE SHIELD | Admitting: Family Medicine

## 2018-05-10 ENCOUNTER — Encounter: Payer: Self-pay | Admitting: Family Medicine

## 2018-05-10 VITALS — BP 102/78 | HR 64 | Temp 97.9°F | Ht 65.0 in | Wt 263.2 lb

## 2018-05-10 DIAGNOSIS — J00 Acute nasopharyngitis [common cold]: Secondary | ICD-10-CM

## 2018-05-10 DIAGNOSIS — Z6841 Body Mass Index (BMI) 40.0 and over, adult: Secondary | ICD-10-CM

## 2018-05-10 DIAGNOSIS — E8881 Metabolic syndrome: Secondary | ICD-10-CM

## 2018-05-10 MED ORDER — AZELASTINE-FLUTICASONE 137-50 MCG/ACT NA SUSP: 1.0000 | Freq: Every day | NASAL | 1 refills | Status: DC

## 2018-05-10 MED ORDER — SEMAGLUTIDE (1 MG/DOSE) 2 MG/1.5ML ~~LOC~~ SOPN: 1.0000 mg | PEN_INJECTOR | SUBCUTANEOUS | 6 refills | Status: DC

## 2018-05-10 MED ORDER — AZITHROMYCIN 250 MG PO TABS: ORAL_TABLET | ORAL | 0 refills | Status: DC

## 2018-05-10 NOTE — Progress Notes (Signed)
Patient: Yvette White MRN: [id number] DOB: Dec 10, 1970 PCP: Briscoe Deutscher, DO     Subjective:  Chief Complaint  Patient presents with  . URI symptoms x 3 wks    HPI: The patient is a 47 y.o. female who presents today for sinus congestion x 3 wks. Her symptoms started out with a sore throat then a cough. These subsided, but her rhinorrhea/congestion has continued. She has some red streaks in her nasal mucous. Occasionally there is a yellow/cream/opaque color to it. She has not used any nasal sprays. She has pressure in her nasal ala, not really her sinuses. She has pressure above her right eye, but this her orbital myositis. She is on daily allergy medication. She has taken cold/flu at the beginning. She is also on MTX and is immunosuppressed so wants to get checked out. No fever/chills. 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 Of note, she started ozempic when symptoms started.   She has had her flu shot.   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 She on 30mg  daily. She is unable to come off of this due to her eye symptoms coming   Review of Systems  Constitutional: Positive for fatigue. Negative for chills and fever.  HENT: Positive for congestion, postnasal drip, rhinorrhea, sinus pressure and sinus pain. Negative for ear pain and sore throat.   Respiratory: Positive for cough and chest tightness. Negative for shortness of breath and wheezing.   Cardiovascular: Negative for chest pain.  Gastrointestinal: Negative for abdominal pain and nausea.  Musculoskeletal: Negative for back pain and neck pain.  Neurological: Negative for dizziness and headaches.  Psychiatric/Behavioral: Negative for sleep disturbance.    Allergies Patient is allergic to theophylline; penicillins; pseudoephedrine; and sulfamethoxazole-trimethoprim.  Past Medical History Patient  has a past medical history of ADD (attention deficit disorder), controlled with  Adderall, Anemia, Anxiety, Arthritis, Chicken pox, Depression, Disorder of thyroid, GERD (gastroesophageal reflux disease), Heart murmur, Hyperthyroidism, Menstrual disorder, Migraine, Morbid obesity ([place]), Neurocardiogenic syncope, OSA on CPAP, Pre-diabetes, Swallowing difficulty, and Vitamin D deficiency.  Surgical History Patient  has a past surgical history that includes Urethral dilation (1979); Tilt table study; Wisdom tooth extraction; Dilatation & curettage/hysteroscopy with myosure (N/A, 06/08/2016); and Laparoscopic gastric sleeve resection (N/A, 02/20/2017).  Family History Pateint's family history includes Atrial fibrillation in her maternal grandmother; Cancer in her mother; Colon cancer (age of onset: 70) in her maternal grandfather; Depression in her mother; Diabetes in her father and mother; Heart attack (age of onset: 31) in her father; Heart disease in her father, mother, paternal grandfather, and paternal grandmother; Hyperlipidemia in her father and mother; Hypertension in her father and mother; Kidney disease in her mother; Obesity in her father and mother; Parkinson's disease in her maternal grandmother; Sleep apnea in her mother; Thyroid disease in her mother; Uterine cancer in her mother.  Social History Patient  reports that she quit smoking about 3 years ago. Her smoking use included cigarettes. She has a 5.00 pack-year smoking history. She has never used smokeless tobacco. She reports current alcohol use. She reports that she does not use drugs.    Objective: Vitals:   05/10/18 1306  BP: 102/78  Pulse: 64  Temp: 97.9 F (36.6 C)  TempSrc: Oral  SpO2: 97%  Weight: 263 lb 3.2 oz (119.4 kg)  Height: 5\' 5"  (1.651 m)    Body mass index is 43.8 kg/m.  Physical Exam Vitals signs reviewed.  Constitutional:  Appearance: She is not ill-appearing or toxic-appearing.  HENT:     Right Ear: Tympanic membrane, ear canal and external ear normal.     Left Ear: Tympanic  membrane, ear canal and external ear normal.     Nose: Rhinorrhea present.     Comments: Mild edema bilateral turbinates. No bleeding. No sinus TTP  Neck:     Musculoskeletal: Normal range of motion and neck supple.  Cardiovascular:     Rate and Rhythm: Normal rate and regular rhythm.     Heart sounds: Normal heart sounds.  Pulmonary:     Effort: Pulmonary effort is normal.     Breath sounds: Normal breath sounds.  Abdominal:     General: Abdomen is flat. Bowel sounds are normal.     Palpations: Abdomen is soft.  Lymphadenopathy:     Cervical: No cervical adenopathy.  Skin:    General: Skin is warm and dry.     Capillary Refill: Capillary refill takes less than 2 seconds.  Neurological:     Mental Status: She is alert.        Assessment/plan: 1. Acute nasopharyngitis No signs of bacterial infection at this time. I wonder if ozempic could be contributing as symptoms started with new medication. Do not see as adverse effect. Will start her on dymista and continue allergy medication. recommended trial of cool mist humidifier at night. If dymista is too expensive would just get flonase, but can make nose bleeds worse. Already on daily prednisone. Will send in zpack for her, but she will not pick up unless fever/worsneing symptoms.   2. Insulin resistance, A1c 5.5 on 07/25/17 Never received her px from PCP. Gave one sample pack and sent in px for her.  - Semaglutide, 1 MG/DOSE, (OZEMPIC, 1 MG/DOSE,) 2 MG/1.5ML SOPN; Inject 1 mg into the skin once a week.  Dispense: 1 pen; Refill: 6  3. Morbid obesity with BMI of 40.0-44.9, adult (HCC)  - Semaglutide, 1 MG/DOSE, (OZEMPIC, 1 MG/DOSE,) 2 MG/1.5ML SOPN; Inject 1 mg into the skin once a week.  Dispense: 1 pen; Refill: 6    Return if symptoms worsen or fail to improve.   Orma Flaming, MD [place]   05/10/2018

## 2018-05-10 NOTE — Patient Instructions (Signed)
I think this is all viral. Start dymista daily.Marland Kitchen if too expensive just get flonase. Start cool mist humidifier.   im sending in zpack, but would only get if fever/worsening symptoms.

## 2018-05-14 DIAGNOSIS — F4323 Adjustment disorder with mixed anxiety and depressed mood: Secondary | ICD-10-CM | POA: Diagnosis not present

## 2018-05-16 DIAGNOSIS — H0510 Unspecified chronic inflammatory disorders of orbit: Secondary | ICD-10-CM | POA: Diagnosis not present

## 2018-05-27 DIAGNOSIS — Z713 Dietary counseling and surveillance: Secondary | ICD-10-CM | POA: Diagnosis not present

## 2018-05-28 DIAGNOSIS — F4323 Adjustment disorder with mixed anxiety and depressed mood: Secondary | ICD-10-CM | POA: Diagnosis not present

## 2018-06-04 DIAGNOSIS — Z713 Dietary counseling and surveillance: Secondary | ICD-10-CM | POA: Diagnosis not present

## 2018-06-04 DIAGNOSIS — F4323 Adjustment disorder with mixed anxiety and depressed mood: Secondary | ICD-10-CM | POA: Diagnosis not present

## 2018-06-10 DIAGNOSIS — F4323 Adjustment disorder with mixed anxiety and depressed mood: Secondary | ICD-10-CM | POA: Diagnosis not present

## 2018-06-11 DIAGNOSIS — Z713 Dietary counseling and surveillance: Secondary | ICD-10-CM | POA: Diagnosis not present

## 2018-06-25 DIAGNOSIS — Z713 Dietary counseling and surveillance: Secondary | ICD-10-CM | POA: Diagnosis not present

## 2018-06-26 ENCOUNTER — Encounter: Payer: Self-pay | Admitting: Family Medicine

## 2018-06-26 ENCOUNTER — Ambulatory Visit (INDEPENDENT_AMBULATORY_CARE_PROVIDER_SITE_OTHER): Payer: BLUE CROSS/BLUE SHIELD | Admitting: Family Medicine

## 2018-06-26 VITALS — BP 92/58 | HR 64 | Temp 97.9°F | Ht 65.0 in | Wt 266.4 lb

## 2018-06-26 DIAGNOSIS — Z7952 Long term (current) use of systemic steroids: Secondary | ICD-10-CM | POA: Diagnosis not present

## 2018-06-26 DIAGNOSIS — M7989 Other specified soft tissue disorders: Secondary | ICD-10-CM | POA: Diagnosis not present

## 2018-06-26 DIAGNOSIS — R110 Nausea: Secondary | ICD-10-CM

## 2018-06-26 DIAGNOSIS — F4323 Adjustment disorder with mixed anxiety and depressed mood: Secondary | ICD-10-CM | POA: Diagnosis not present

## 2018-06-26 DIAGNOSIS — R5383 Other fatigue: Secondary | ICD-10-CM

## 2018-06-26 LAB — CBC WITH DIFFERENTIAL/PLATELET
Basophils Absolute: 0.1 10*3/uL (ref 0.0–0.1)
Basophils Relative: 0.8 % (ref 0.0–3.0)
EOS ABS: 0.2 10*3/uL (ref 0.0–0.7)
Eosinophils Relative: 1.7 % (ref 0.0–5.0)
HEMATOCRIT: 39.3 % (ref 36.0–46.0)
Hemoglobin: 13.2 g/dL (ref 12.0–15.0)
LYMPHS PCT: 27.4 % (ref 12.0–46.0)
Lymphs Abs: 3 10*3/uL (ref 0.7–4.0)
MCHC: 33.6 g/dL (ref 30.0–36.0)
MCV: 93.9 fl (ref 78.0–100.0)
Monocytes Absolute: 0.5 10*3/uL (ref 0.1–1.0)
Monocytes Relative: 4.6 % (ref 3.0–12.0)
Neutro Abs: 7.2 10*3/uL (ref 1.4–7.7)
Neutrophils Relative %: 65.5 % (ref 43.0–77.0)
Platelets: 290 10*3/uL (ref 150.0–400.0)
RBC: 4.19 Mil/uL (ref 3.87–5.11)
RDW: 14.3 % (ref 11.5–15.5)
WBC: 11 10*3/uL — AB (ref 4.0–10.5)

## 2018-06-26 LAB — COMPREHENSIVE METABOLIC PANEL
ALK PHOS: 69 U/L (ref 39–117)
ALT: 35 U/L (ref 0–35)
AST: 21 U/L (ref 0–37)
Albumin: 3.7 g/dL (ref 3.5–5.2)
BUN: 17 mg/dL (ref 6–23)
CALCIUM: 8.9 mg/dL (ref 8.4–10.5)
CO2: 34 mEq/L — ABNORMAL HIGH (ref 19–32)
Chloride: 101 mEq/L (ref 96–112)
Creatinine, Ser: 0.8 mg/dL (ref 0.40–1.20)
GFR: 77.22 mL/min (ref >60.00)
Glucose, Bld: 79 mg/dL (ref 70–99)
Potassium: 3.8 mEq/L (ref 3.5–5.1)
Sodium: 139 mEq/L (ref 135–145)
TOTAL PROTEIN: 5.7 g/dL — AB (ref 6.0–8.3)
Total Bilirubin: 0.8 mg/dL (ref 0.2–1.2)

## 2018-06-26 LAB — HEMOGLOBIN A1C: Hgb A1c MFr Bld: 5.5 % (ref 4.6–6.5)

## 2018-06-26 LAB — VITAMIN D 25 HYDROXY (VIT D DEFICIENCY, FRACTURES): VITD: 24.32 ng/mL — ABNORMAL LOW (ref 30.00–100.00)

## 2018-06-26 LAB — TSH: TSH: 3.59 u[IU]/mL (ref 0.35–4.50)

## 2018-06-26 LAB — BRAIN NATRIURETIC PEPTIDE: Pro B Natriuretic peptide (BNP): 40 pg/mL (ref 0.0–100.0)

## 2018-06-26 MED ORDER — ONDANSETRON HCL 4 MG PO TABS: 4.0000 mg | ORAL_TABLET | Freq: Three times a day (TID) | ORAL | 0 refills | Status: DC | PRN

## 2018-06-26 MED ORDER — SUCRALFATE 1 G PO TABS: 1.0000 g | ORAL_TABLET | Freq: Three times a day (TID) | ORAL | 0 refills | Status: DC

## 2018-06-26 NOTE — Progress Notes (Signed)
Patient: Yvette White MRN: [id number] DOB: November 21, 1970 PCP: Briscoe Deutscher, DO     Subjective:  Chief Complaint  Patient presents with  . Nausea  . Fatigue    HPI: The patient is a 48 y.o. female who presents today for nausea and fatigue and flushed face x 3 weeks. She states she has been nauseated 75% of her day. No stomach pain and no emesis. She gets intermittently flushing and hot in her face. She also has increased fatigue that is more than normal. She is able to get out of bed and go to work. She has had nothing change and no new medication. Still on chronic steroids. She did increase her ozempic pen to a higher dose a few weeks ago. Not really associated with food. Crackers do tend to make it better.  Also eating wheat thins makes it better.  No diarrhea, no blood in stool. She is not on any NSAIDs, limited greasy/fried foods, not excessive chocolate, limited alcohol and daily caffeine. She does not feel like she is stressed more than normal. She is on protonix daily and takes tums. She has not traveled out of the country. No recent illness or other symptoms. She has no chest pain, shortness of breath, rashes, etc. She does have some mild right leg (more foot) swelling that is new that started last week. She wears compression hose and it makes it feel better. No long traveling, trauma, sedentary lifestyle. Not on ocp and does not smoke. It is not red or tender to touch.   Review of Systems  Constitutional: Positive for fatigue. Negative for chills, fever and unexpected weight change.  Respiratory: Negative for shortness of breath.   Cardiovascular: Positive for leg swelling. Negative for chest pain.       Pt states she has been having intermittent right leg swelling   Gastrointestinal: Positive for constipation and nausea. Negative for abdominal distention, abdominal pain, blood in stool, diarrhea and vomiting.  Musculoskeletal: Negative for back pain and myalgias.  Skin: Negative for  rash.  Neurological: Positive for light-headedness. Negative for dizziness, weakness and headaches.    Allergies Patient is allergic to theophylline; penicillins; pseudoephedrine; and sulfamethoxazole-trimethoprim.  Past Medical History Patient  has a past medical history of ADD (attention deficit disorder), controlled with Adderall, Anemia, Anxiety, Arthritis, Chicken pox, Depression, Disorder of thyroid, GERD (gastroesophageal reflux disease), Heart murmur, Hyperthyroidism, Menstrual disorder, Migraine, Morbid obesity ([place]), Neurocardiogenic syncope, OSA on CPAP, Pre-diabetes, Swallowing difficulty, and Vitamin D deficiency.  Surgical History Patient  has a past surgical history that includes Urethral dilation (1979); Tilt table study; Wisdom tooth extraction; Dilatation & curettage/hysteroscopy with myosure (N/A, 06/08/2016); and Laparoscopic gastric sleeve resection (N/A, 02/20/2017).  Family History Pateint's family history includes Atrial fibrillation in her maternal grandmother; Cancer in her mother; Colon cancer (age of onset: 86) in her maternal grandfather; Depression in her mother; Diabetes in her father and mother; Heart attack (age of onset: 45) in her father; Heart disease in her father, mother, paternal grandfather, and paternal grandmother; Hyperlipidemia in her father and mother; Hypertension in her father and mother; Kidney disease in her mother; Obesity in her father and mother; Parkinson's disease in her maternal grandmother; Sleep apnea in her mother; Thyroid disease in her mother; Uterine cancer in her mother.  Social History Patient  reports that she quit smoking about 3 years ago. Her smoking use included cigarettes. She has a 5.00 pack-year smoking history. She has never used smokeless tobacco. She reports current alcohol use. She  reports that she does not use drugs.    Objective: Vitals:   06/26/18 0839  BP: (!) 92/58  Pulse: 64  Temp: 97.9 F (36.6 C)  TempSrc:  Oral  SpO2: 97%  Weight: 266 lb 6.4 oz (120.8 kg)  Height: 5\' 5"  (1.651 m)    Body mass index is 44.33 kg/m.  Physical Exam Vitals signs reviewed.  Constitutional:      Appearance: She is obese.  HENT:     Head: Normocephalic and atraumatic.     Nose: Nose normal.  Eyes:     Extraocular Movements: Extraocular movements intact.     Pupils: Pupils are equal, round, and reactive to light.  Neck:     Musculoskeletal: Normal range of motion and neck supple.  Cardiovascular:     Rate and Rhythm: Normal rate and regular rhythm.     Heart sounds: Normal heart sounds.     Comments: No right lower leg swelling.  Pulmonary:     Effort: Pulmonary effort is normal.     Breath sounds: Normal breath sounds.  Abdominal:     General: Abdomen is flat. Bowel sounds are normal. There is no distension.     Palpations: Abdomen is soft.     Tenderness: There is no abdominal tenderness.     Comments: Negative murphy's sign   Lymphadenopathy:     Cervical: No cervical adenopathy.  Skin:    General: Skin is warm and dry.     Capillary Refill: Capillary refill takes less than 2 seconds.     Findings: No rash.  Neurological:     General: No focal deficit present.     Mental Status: She is alert and oriented to person, place, and time.        Assessment/plan: 1. Nausea Large differential. We are going to have her decrease her ozempic dose to see if this helps symptoms. Other differential is ulcer due to chronic steroids, low blood pressure, viral illness, gallbladder disease although less likely since has no abdominal pain. With her flushing carcinoid is very low, but if not getting better something to consider. Will check labs today, continue her PPI and start carafate although won't help with healing time. zofran prn. Already has f/u with pcp in place. Can't check h.pylori breast test since on chronic PPI.   2. Other fatigue  - Comprehensive metabolic panel - CBC with  Differential/Platelet - TSH - VITAMIN D 25 Hydroxy (Vit-D Deficiency, Fractures)  3. Current chronic use of systemic steroids Checking a1c, has appointment with primary and usually checks labs so will do this since she is getting labs today.  - Hemoglobin A1c  4. Leg swelling No leg swelling on exam. Will check BNP. Continue compression hose. Precautions given.  - Brain natriuretic peptide      Return if symptoms worsen or fail to improve.   Orma Flaming, MD [place]   06/26/2018

## 2018-06-26 NOTE — Patient Instructions (Signed)
-  thoughts are increased ozempic is causing this so back down to smaller dose.  -steroid induced ulcer. Already on protonix/tums. Adding carafate although research shows helps, but doesn't increase healing time of ulcer.   -checking all labs  -if not better when you see Dr.Wallace, next step would be imaging/see GI for possible scope.

## 2018-06-27 ENCOUNTER — Other Ambulatory Visit: Payer: Self-pay | Admitting: Family Medicine

## 2018-06-27 MED ORDER — VITAMIN D (ERGOCALCIFEROL) 1.25 MG (50000 UNIT) PO CAPS: ORAL_CAPSULE | ORAL | 0 refills | Status: DC

## 2018-07-03 DIAGNOSIS — F4323 Adjustment disorder with mixed anxiety and depressed mood: Secondary | ICD-10-CM | POA: Diagnosis not present

## 2018-07-03 DIAGNOSIS — Z713 Dietary counseling and surveillance: Secondary | ICD-10-CM | POA: Diagnosis not present

## 2018-07-07 NOTE — Progress Notes (Signed)
Yvette White is a 48 y.o. female is here for follow up.  History of Present Illness:   HPI: Patient has maintained her weight over the past month.  This is a victory since she has had some weight gain over the past 5 to 6 months.  She continues to be on daily prednisone, now at 30 mg/day.  She has tried methotrexate without improvement.  She is hoping that her rheumatologist will put her on a new biologic soon so that she may start weaning off of the prednisone.  She has been struggling with cravings and hunger.  She is still going to simple nutrition which is to heath at any size group and working with a dietitian named Coy Saunas.  She feels that this is working well for her in terms of finding balance with eating.  She is very frustrated that she has worked so hard to lose weight and is now fighting against prednisone.  Patient was seen a month ago for nausea.  This was improved with decreasing Ozempic to 0.5 mg weekly.  She was also started on Carafate at that time.  There are no preventive care reminders to display for this patient. Depression screen [facility] 2/9 07/08/2018 12/24/2017 01/24/2017  Decreased Interest 0 0 2  Down, Depressed, Hopeless 0 1 3  PHQ - 2 Score 0 1 5  Altered sleeping 0 2 3  Tired, decreased energy 1 1 3   Change in appetite 1 2 2   Feeling bad or failure about yourself  0 2 2  Trouble concentrating 0 1 1  Moving slowly or fidgety/restless 0 1 3  Suicidal thoughts 0 0 1  PHQ-9 Score 2 10 20   Difficult doing work/chores Not difficult at all Somewhat difficult Very difficult   PMHx, SurgHx, SocialHx, FamHx, Medications, and Allergies were reviewed in the Visit Navigator and updated as appropriate.   Patient Active Problem List   Diagnosis Date Noted  . Diplopia 01/01/2018  . 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 Migraine without aura and without status migrainosus, not intractable 12/31/2017  . Chronic  thoracic back pain 09/27/2017  . Low back pain 09/27/2017  . Former smoker, quit in 2016 07/29/2017  . Low HDL (under 40) 07/29/2017  . Bladder spasms 07/29/2017  . History of abnormal mammogram 07/29/2017  . Ocular pain, right eye 07/29/2017  . Thyroid condition, history of low TSH that normalized with monitoring 07/29/2017  . High risk medication use - PREDNISONE x 3, s/p recent gastric sleeve 07/29/2017  . ADD (attention deficit disorder)   . OSA on CPAP   . Migraine   . Vitamin D deficiency, on 50K IU weekly 05/02/2017  . Insulin resistance, A1c 5.5 on 07/25/17 05/02/2017  . 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 Bipolar 1 disorder ([place]), followed by Psych, controlled with Lamictal 01/24/2017  . Morbid obesity ([place]), s/p gastric sleeve, at time of surgery - weight 382, BMI 61 07/13/2016  . Abnormal menses 03/06/2016  . GERD (gastroesophageal reflux disease), on Prilosec 03/06/2016  . History of neurocardiogenic syncope, no episodes since 2017 03/06/2016  . Pharyngoesophageal dysphagia 03/06/2016  . Seasonal allergies, controlled with Claritin 08/25/2013   Social History   Tobacco Use  . Smoking status: Former Smoker    Packs/day: 0.25    Years: 20.00    Pack years: 5.00    Types: Cigarettes    Last attempt to quit: 09/30/2014    Years  since quitting: 3.7  . Smokeless tobacco: Never Used  Substance Use Topics  . Alcohol use: Yes    Comment: occ  . Drug use: No   Current Medications and Allergies   .  acetaminophen (TYLENOL) 500 MG tablet, Take 1,000 mg by mouth every 6 (six) hours as needed (for pain.)., Disp: , Rfl:  .  Azelastine-Fluticasone 137-50 MCG/ACT SUSP, Place 1 spray into the nose daily., Disp: 23 g, Rfl: 1 .  buPROPion (WELLBUTRIN XL) 150 MG 24 hr tablet, Take 150 mg by mouth daily., Disp: , Rfl:  .  Calcium Carbonate Antacid (ANTACID PO), Take 500 mg by mouth 3 (three) times daily. , Disp: , Rfl:  .  lamoTRIgine (LAMICTAL) 200 MG tablet,  Take 200 mg by mouth every evening. , Disp: , Rfl:  .  loratadine (CLARITIN) 10 MG tablet, Take 10 mg by mouth every evening., Disp: , Rfl:  .  Meth-Hyo-M Bl-Na Phos-Ph Sal (URIBEL) 118 MG CAPS, One po QID prn bladder spasm, Disp: 30 capsule, Rfl: 0 .  Methotrexate Sodium (METHOTREXATE, PF,) 50 MG/2ML injection, once a week., Disp: , Rfl: 2 .  Multiple Vitamins-Minerals (CELEBRATE MULTI-COMPLETE 18 PO), Take by mouth., Disp: , Rfl:  .  ondansetron (ZOFRAN) 4 MG tablet, Take 1 tablet (4 mg total) by mouth every 8 (eight) hours as needed for nausea or vomiting., Disp: 20 tablet, Rfl: 0 .  pantoprazole (PROTONIX) 40 MG tablet, Take 40 mg by mouth daily., Disp: , Rfl: 3 .  predniSONE (DELTASONE) 10 MG tablet, , Disp: , Rfl:  .  predniSONE (DELTASONE) 20 MG tablet, , Disp: , Rfl:  .  Semaglutide, 1 MG/DOSE, (OZEMPIC, 1 MG/DOSE,) 2 MG/1.5ML SOPN, Inject 1 mg into the skin once a week., Disp: 1 pen, Rfl: 6 .  sucralfate (CARAFATE) 1 g tablet, Take 1 tablet (1 g total) by mouth 4 (four) times daily -  with meals and at bedtime., Disp: 120 tablet, Rfl: 0 .  Vitamin D, Ergocalciferol, (DRISDOL) 1.25 MG (50000 UT) CAPS capsule, One capsule by mouth once a week for 8 weeks. Then take 1000IU/day, Disp: 8 capsule, Rfl: 0   Allergies  Allergen Reactions  . Theophylline     Other reaction(s): Other (See Comments) Hyperactive  "Slo phylinne"  . Penicillins Rash    Has patient had a PCN reaction causing immediate rash, facial/tongue/throat swelling, SOB or lightheadedness with hypotension:unsure Has patient had a PCN reaction causing severe rash involving mucus membranes or skin necrosis:unsure Has patient had a PCN reaction that required hospitalization:No Has patient had a PCN reaction occurring within the last 10 years: No If all of the above answers are "NO", then may proceed with Cephalosporin use.   . Pseudoephedrine Rash  . Sulfamethoxazole-Trimethoprim Rash   Review of Systems   Pertinent  items are noted in the HPI. Otherwise, a complete ROS is negative.  Vitals   Vitals:   07/08/18 1418  BP: 108/60  Pulse: 77  Temp: 98.6 F (37 C)  TempSrc: Oral  SpO2: 96%  Weight: 265 lb (120.2 kg)  Height: 5\' 5"  (1.651 m)     Body mass index is 44.1 kg/m.  Physical Exam   Physical Exam  Results for orders placed or performed in visit on 06/26/18  Comprehensive metabolic panel  Result Value Ref Range   Sodium 139 135 - 145 mEq/L   Potassium 3.8 3.5 - 5.1 mEq/L   Chloride 101 96 - 112 mEq/L   CO2 34 (H) 19 - 32  mEq/L   Glucose, Bld 79 70 - 99 mg/dL   BUN 17 6 - 23 mg/dL   Creatinine, Ser 0.80 0.40 - 1.20 mg/dL   Total Bilirubin 0.8 0.2 - 1.2 mg/dL   Alkaline Phosphatase 69 39 - 117 U/L   AST 21 0 - 37 U/L   ALT 35 0 - 35 U/L   Total Protein 5.7 (L) 6.0 - 8.3 g/dL   Albumin 3.7 3.5 - 5.2 g/dL   Calcium 8.9 8.4 - 10.5 mg/dL   GFR 77.22 >60.00 mL/min  CBC with Differential/Platelet  Result Value Ref Range   WBC 11.0 (H) 4.0 - 10.5 K/uL   RBC 4.19 3.87 - 5.11 Mil/uL   Hemoglobin 13.2 12.0 - 15.0 g/dL   HCT 39.3 36.0 - 46.0 %   MCV 93.9 78.0 - 100.0 fl   MCHC 33.6 30.0 - 36.0 g/dL   RDW 14.3 11.5 - 15.5 %   Platelets 290.0 150.0 - 400.0 K/uL   Neutrophils Relative % 65.5 43.0 - 77.0 %   Lymphocytes Relative 27.4 12.0 - 46.0 %   Monocytes Relative 4.6 3.0 - 12.0 %   Eosinophils Relative 1.7 0.0 - 5.0 %   Basophils Relative 0.8 0.0 - 3.0 %   Neutro Abs 7.2 1.4 - 7.7 K/uL   Lymphs Abs 3.0 0.7 - 4.0 K/uL   Monocytes Absolute 0.5 0.1 - 1.0 K/uL   Eosinophils Absolute 0.2 0.0 - 0.7 K/uL   Basophils Absolute 0.1 0.0 - 0.1 K/uL  TSH  Result Value Ref Range   TSH 3.59 0.35 - 4.50 uIU/mL  Hemoglobin A1c  Result Value Ref Range   Hgb A1c MFr Bld 5.5 4.6 - 6.5 %  VITAMIN D 25 Hydroxy (Vit-D Deficiency, Fractures)  Result Value Ref Range   VITD 24.32 (L) 30.00 - 100.00 ng/mL  Brain natriuretic peptide  Result Value Ref Range   Pro B Natriuretic peptide (BNP) 40.0  0.0 - 100.0 pg/mL    Assessment and Plan   Harue was seen today for follow-up.  Diagnoses and all orders for this visit:  Gastroesophageal reflux disease without esophagitis  Idiopathic orbital inflammatory syndrome Comments: Currently on 30 mg prednisone daily.  Will follow along with rheumatology.  High risk medication use - PREDNISONE, s/p recent gastric sleeve Comments: We will continue PPI.  Okay Pepcid or Zantac as needed as well.  Okay to stop Carafate.  Insulin resistance, A1c 5.5 on 07/25/17 Comments: We will continue Ozempic at low dose.  Hopefully, this will mitigate the metabolic issues while taking prednisone.  Other orders -     Semaglutide,0.25 or 0.5MG /DOS, (OZEMPIC, 0.25 OR 0.5 MG/DOSE,) 2 MG/1.5ML SOPN; Inject 0.5 mg into the skin once a week. -     buPROPion (WELLBUTRIN XL) 300 MG 24 hr tablet; Take 1 tablet (300 mg total) by mouth daily.    . Orders and follow up as documented in [place], reviewed diet, exercise and weight control, cardiovascular risk and specific lipid/LDL goals reviewed, reviewed medications and side effects in detail.  . Reviewed expectations re: course of current medical issues. . Outlined signs and symptoms indicating need for more acute intervention. . Patient verbalized understanding and all questions were answered. . Patient received an After Visit Summary.  Briscoe Deutscher, DO [place], [place] [facility] 07/09/2018

## 2018-07-08 ENCOUNTER — Ambulatory Visit: Payer: BLUE CROSS/BLUE SHIELD | Admitting: Family Medicine

## 2018-07-08 VITALS — BP 108/60 | HR 77 | Temp 98.6°F | Ht 65.0 in | Wt 265.0 lb

## 2018-07-08 DIAGNOSIS — E8881 Metabolic syndrome: Secondary | ICD-10-CM | POA: Diagnosis not present

## 2018-07-08 DIAGNOSIS — E88819 Insulin resistance, unspecified: Secondary | ICD-10-CM

## 2018-07-08 DIAGNOSIS — H0510 Unspecified chronic inflammatory disorders of orbit: Secondary | ICD-10-CM | POA: Diagnosis not present

## 2018-07-08 DIAGNOSIS — Z79899 Other long term (current) drug therapy: Secondary | ICD-10-CM | POA: Diagnosis not present

## 2018-07-08 DIAGNOSIS — K219 Gastro-esophageal reflux disease without esophagitis: Secondary | ICD-10-CM

## 2018-07-08 MED ORDER — BUPROPION HCL ER (XL) 300 MG PO TB24: 300.0000 mg | ORAL_TABLET | Freq: Every day | ORAL | 1 refills | Status: DC

## 2018-07-08 MED ORDER — SEMAGLUTIDE(0.25 OR 0.5MG/DOS) 2 MG/1.5ML ~~LOC~~ SOPN: 0.5000 mg | PEN_INJECTOR | SUBCUTANEOUS | 0 refills | Status: DC

## 2018-07-08 NOTE — Patient Instructions (Signed)
Stop Carfate Ok to take pepcid and tums  Increase Wellbutrin to 300mg  daily

## 2018-07-09 ENCOUNTER — Encounter: Payer: Self-pay | Admitting: Family Medicine

## 2018-07-09 DIAGNOSIS — Z713 Dietary counseling and surveillance: Secondary | ICD-10-CM | POA: Diagnosis not present

## 2018-07-15 DIAGNOSIS — Z7952 Long term (current) use of systemic steroids: Secondary | ICD-10-CM | POA: Diagnosis not present

## 2018-07-15 DIAGNOSIS — Z1159 Encounter for screening for other viral diseases: Secondary | ICD-10-CM | POA: Diagnosis not present

## 2018-07-15 DIAGNOSIS — Z79899 Other long term (current) drug therapy: Secondary | ICD-10-CM | POA: Diagnosis not present

## 2018-07-15 DIAGNOSIS — H0510 Unspecified chronic inflammatory disorders of orbit: Secondary | ICD-10-CM | POA: Diagnosis not present

## 2018-07-17 DIAGNOSIS — F4323 Adjustment disorder with mixed anxiety and depressed mood: Secondary | ICD-10-CM | POA: Diagnosis not present

## 2018-07-17 DIAGNOSIS — Z713 Dietary counseling and surveillance: Secondary | ICD-10-CM | POA: Diagnosis not present

## 2018-07-22 DIAGNOSIS — F4323 Adjustment disorder with mixed anxiety and depressed mood: Secondary | ICD-10-CM | POA: Diagnosis not present

## 2018-07-24 DIAGNOSIS — Z713 Dietary counseling and surveillance: Secondary | ICD-10-CM | POA: Diagnosis not present

## 2018-07-31 DIAGNOSIS — Z713 Dietary counseling and surveillance: Secondary | ICD-10-CM | POA: Diagnosis not present

## 2018-07-31 DIAGNOSIS — F4323 Adjustment disorder with mixed anxiety and depressed mood: Secondary | ICD-10-CM | POA: Diagnosis not present

## 2018-08-05 DIAGNOSIS — F4323 Adjustment disorder with mixed anxiety and depressed mood: Secondary | ICD-10-CM | POA: Diagnosis not present

## 2018-08-06 DIAGNOSIS — Z713 Dietary counseling and surveillance: Secondary | ICD-10-CM | POA: Diagnosis not present

## 2018-08-20 DIAGNOSIS — Z713 Dietary counseling and surveillance: Secondary | ICD-10-CM | POA: Diagnosis not present

## 2018-08-27 DIAGNOSIS — F4323 Adjustment disorder with mixed anxiety and depressed mood: Secondary | ICD-10-CM | POA: Diagnosis not present

## 2018-08-28 DIAGNOSIS — Z713 Dietary counseling and surveillance: Secondary | ICD-10-CM | POA: Diagnosis not present

## 2018-09-03 DIAGNOSIS — F4323 Adjustment disorder with mixed anxiety and depressed mood: Secondary | ICD-10-CM | POA: Diagnosis not present

## 2018-09-04 DIAGNOSIS — Z713 Dietary counseling and surveillance: Secondary | ICD-10-CM | POA: Diagnosis not present

## 2018-09-10 DIAGNOSIS — Z713 Dietary counseling and surveillance: Secondary | ICD-10-CM | POA: Diagnosis not present

## 2018-09-11 DIAGNOSIS — F4323 Adjustment disorder with mixed anxiety and depressed mood: Secondary | ICD-10-CM | POA: Diagnosis not present

## 2018-09-17 DIAGNOSIS — Z713 Dietary counseling and surveillance: Secondary | ICD-10-CM | POA: Diagnosis not present

## 2018-09-18 DIAGNOSIS — F4323 Adjustment disorder with mixed anxiety and depressed mood: Secondary | ICD-10-CM | POA: Diagnosis not present

## 2018-09-24 DIAGNOSIS — Z713 Dietary counseling and surveillance: Secondary | ICD-10-CM | POA: Diagnosis not present

## 2018-09-25 DIAGNOSIS — F4323 Adjustment disorder with mixed anxiety and depressed mood: Secondary | ICD-10-CM | POA: Diagnosis not present

## 2018-09-30 DIAGNOSIS — Z713 Dietary counseling and surveillance: Secondary | ICD-10-CM | POA: Diagnosis not present

## 2018-10-02 DIAGNOSIS — F4323 Adjustment disorder with mixed anxiety and depressed mood: Secondary | ICD-10-CM | POA: Diagnosis not present

## 2018-10-07 ENCOUNTER — Other Ambulatory Visit: Payer: Self-pay

## 2018-10-07 ENCOUNTER — Telehealth (INDEPENDENT_AMBULATORY_CARE_PROVIDER_SITE_OTHER): Payer: BLUE CROSS/BLUE SHIELD | Admitting: Family Medicine

## 2018-10-07 ENCOUNTER — Encounter: Payer: Self-pay | Admitting: Family Medicine

## 2018-10-07 VITALS — Ht 65.0 in | Wt 272.0 lb

## 2018-10-07 DIAGNOSIS — F900 Attention-deficit hyperactivity disorder, predominantly inattentive type: Secondary | ICD-10-CM

## 2018-10-07 DIAGNOSIS — E559 Vitamin D deficiency, unspecified: Secondary | ICD-10-CM

## 2018-10-07 DIAGNOSIS — H5711 Ocular pain, right eye: Secondary | ICD-10-CM

## 2018-10-07 DIAGNOSIS — E8881 Metabolic syndrome: Secondary | ICD-10-CM

## 2018-10-07 DIAGNOSIS — K219 Gastro-esophageal reflux disease without esophagitis: Secondary | ICD-10-CM | POA: Diagnosis not present

## 2018-10-07 DIAGNOSIS — Z79899 Other long term (current) drug therapy: Secondary | ICD-10-CM | POA: Diagnosis not present

## 2018-10-07 DIAGNOSIS — H0510 Unspecified chronic inflammatory disorders of orbit: Secondary | ICD-10-CM | POA: Diagnosis not present

## 2018-10-07 NOTE — Progress Notes (Signed)
Virtual Visit via Video   Due to the COVID-19 pandemic, this visit was completed with telemedicine (audio/video) technology to reduce patient and provider exposure as well as to preserve personal protective equipment.   I connected with Yvette White by a video enabled telemedicine application and verified that I am speaking with the correct person using two identifiers. Location patient: Home Location provider:  [facility], Office Persons participating in the virtual visit: NASTEHO GLANTZ, Briscoe Deutscher, DO Lonell Grandchild, CMA acting as scribe for Dr. Briscoe Deutscher.   I discussed the limitations of evaluation and management by telemedicine and the availability of in person appointments. The patient expressed understanding and agreed to proceed.  Care Team   Patient Care Team: Briscoe Deutscher, DO as PCP - General (Family Medicine) Johnathan Hausen, MD as Consulting Physician (General Surgery) Warden Fillers, MD as Consulting Physician (Ophthalmology) Chucky May, MD as Consulting Physician (Psychiatry) Louretta Shorten, MD as Consulting Physician (Obstetrics and Gynecology)  Subjective:   HPI:   She has concerns about her weight gain. She is still on the prednisone. She is seeing dietitian and therapist for this. She is up 22lbs in one year. The last 10 to 15 lbs have been in the last three months.   Vitamin D Deficiency Started on replacement for 8 weeks by [name] no 06/26/2018. After completion she was to take 2000IU daily over the counter.   Gastroesophageal reflux disease without esophagitis  Idiopathic orbital inflammatory syndrome Currently on 30 mg prednisone daily.  Will follow along with rheumatology. Her next app with them will be 10/17/18. She has also started on Humira  and methotrexate. We have reviewed all precautions with Covid and starting back to work for her.   High risk medication use - PREDNISONE, s/p recent gastric sleeve We will continue PPI.  Okay  Pepcid or Zantac as needed as well.  Okay to stop Carafate.  Insulin resistance, A1c 5.5 on 07/25/17 We will continue Ozempic at low dose.  Hopefully, this will mitigate the metabolic issues while taking prednisone.She has been taking Ozempic at 0.5mg  weekly and no symptoms. She has only had the 1mg  pen so she has been eyeballing for the 0.5 dose but she has not had any symptoms.   Covid-19: Patient had questions about IGG testing. We have answered and reviewed all information regarding with her. She will call our office if she decides to have tested.   Review of Systems  Constitutional: Negative for chills and fever.  HENT: Negative for hearing loss and tinnitus.   Eyes: Negative for blurred vision and double vision.  Respiratory: Negative for cough and hemoptysis.   Cardiovascular: Negative for chest pain and palpitations.  Gastrointestinal: Negative for heartburn and nausea.  Genitourinary: Negative for dysuria.  Musculoskeletal: Negative for myalgias and neck pain.  Skin: Negative for rash.  Neurological: Negative for dizziness and headaches.  Endo/Heme/Allergies: Negative for environmental allergies. Does not bruise/bleed easily.  Psychiatric/Behavioral: Negative for depression and suicidal ideas.     Patient Active Problem List   Diagnosis Date Noted  . Diplopia 01/01/2018  . 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 Migraine without aura and without status migrainosus, not intractable 12/31/2017  . Chronic thoracic back pain 09/27/2017  . Low back pain 09/27/2017  . Former smoker, quit in 2016 07/29/2017  . Low HDL (under 40) 07/29/2017  . Bladder spasms 07/29/2017  . History of abnormal mammogram 07/29/2017  . Ocular pain, right eye  07/29/2017  . Thyroid condition, history of low TSH that normalized with monitoring 07/29/2017  . High risk medication use - PREDNISONE x 3, s/p recent gastric sleeve 07/29/2017  . ADD  (attention deficit disorder)   . OSA on CPAP   . Migraine   . Vitamin D deficiency, on 50K IU weekly 05/02/2017  . Insulin resistance, A1c 5.5 on 07/25/17 05/02/2017  . 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 Bipolar 1 disorder ([place]), followed by Psych, controlled with Lamictal 01/24/2017  . Morbid obesity ([place]), s/p gastric sleeve, at time of surgery - weight 382, BMI 61 07/13/2016  . Abnormal menses 03/06/2016  . GERD (gastroesophageal reflux disease), on Prilosec 03/06/2016  . History of neurocardiogenic syncope, no episodes since 2017 03/06/2016  . Pharyngoesophageal dysphagia 03/06/2016  . Seasonal allergies, controlled with Claritin 08/25/2013    Social History   Tobacco Use  . Smoking status: Former Smoker    Packs/day: 0.25    Years: 20.00    Pack years: 5.00    Types: Cigarettes    Last attempt to quit: 09/30/2014    Years since quitting: 4.0  . Smokeless tobacco: Never Used  Substance Use Topics  . Alcohol use: Yes    Comment: occ    Current Outpatient Medications:  .  acetaminophen (TYLENOL) 500 MG tablet, Take 1,000 mg by mouth every 6 (six) hours as needed (for pain.)., Disp: , Rfl:  .  Adalimumab 40 MG/0.4ML PSKT, Inject into the skin., Disp: , Rfl:  .  Azelastine-Fluticasone 137-50 MCG/ACT SUSP, Place 1 spray into the nose daily., Disp: 23 g, Rfl: 1 .  buPROPion (WELLBUTRIN XL) 300 MG 24 hr tablet, Take 1 tablet (300 mg total) by mouth daily., Disp: 90 tablet, Rfl: 1 .  Calcium Carbonate Antacid (ANTACID PO), Take 500 mg by mouth 3 (three) times daily. , Disp: , Rfl:  .  lamoTRIgine (LAMICTAL) 200 MG tablet, Take 200 mg by mouth every evening. , Disp: , Rfl:  .  loratadine (CLARITIN) 10 MG tablet, Take 10 mg by mouth every evening., Disp: , Rfl:  .  Meth-Hyo-M Bl-Na Phos-Ph Sal (URIBEL) 118 MG CAPS, One po QID prn bladder spasm, Disp: 30 capsule, Rfl: 0 .  Methotrexate Sodium (METHOTREXATE, PF,) 50 MG/2ML injection, once a week., Disp: , Rfl:  2 .  Multiple Vitamins-Minerals (CELEBRATE MULTI-COMPLETE 18 PO), Take by mouth., Disp: , Rfl:  .  ondansetron (ZOFRAN) 4 MG tablet, Take 1 tablet (4 mg total) by mouth every 8 (eight) hours as needed for nausea or vomiting., Disp: 20 tablet, Rfl: 0 .  pantoprazole (PROTONIX) 40 MG tablet, Take 40 mg by mouth daily., Disp: , Rfl: 3 .  predniSONE (DELTASONE) 10 MG tablet, , Disp: , Rfl:  .  predniSONE (DELTASONE) 20 MG tablet, , Disp: , Rfl:  .  Semaglutide,0.25 or 0.5MG /DOS, (OZEMPIC, 0.25 OR 0.5 MG/DOSE,) 2 MG/1.5ML SOPN, Inject 0.5 mg into the skin once a week., Disp: 1 pen, Rfl: 0 .  Vitamin D, Ergocalciferol, (DRISDOL) 1.25 MG (50000 UT) CAPS capsule, One capsule by mouth once a week for 8 weeks. Then take 1000IU/day, Disp: 8 capsule, Rfl: 0 .  lisdexamfetamine (VYVANSE) 20 MG capsule, Take 1 capsule (20 mg total) by mouth daily., Disp: 30 capsule, Rfl: 0 .  Semaglutide,0.25 or 0.5MG /DOS, (OZEMPIC, 0.25 OR 0.5 MG/DOSE,) 2 MG/1.5ML SOPN, Inject 0.5 mg into the skin once a week., Disp: 4 pen, Rfl: 3  Allergies  Allergen Reactions  . Theophylline  Other reaction(s): Other (See Comments) Hyperactive  "Slo phylinne"  . Penicillins Rash    Has patient had a PCN reaction causing immediate rash, facial/tongue/throat swelling, SOB or lightheadedness with hypotension:unsure Has patient had a PCN reaction causing severe rash involving mucus membranes or skin necrosis:unsure Has patient had a PCN reaction that required hospitalization:No Has patient had a PCN reaction occurring within the last 10 years: No If all of the above answers are "NO", then may proceed with Cephalosporin use.   . Pseudoephedrine Rash  . Sulfamethoxazole-Trimethoprim Rash    Objective:   VITALS: Per patient if applicable, see vitals. GENERAL: Alert, appears well and in no acute distress. HEENT: Atraumatic, conjunctiva clear, no obvious abnormalities on inspection of external nose and ears. NECK: Normal movements  of the head and neck. CARDIOPULMONARY: No increased WOB. Speaking in clear sentences. I:E ratio WNL.  MS: Moves all visible extremities without noticeable abnormality. PSYCH: Pleasant and cooperative, well-groomed. Speech normal rate and rhythm. Affect is appropriate. Insight and judgement are appropriate. Attention is focused, linear, and appropriate.  NEURO: CN grossly intact. Oriented as arrived to appointment on time with no prompting. Moves both UE equally.  SKIN: No obvious lesions, wounds, erythema, or cyanosis noted on face or hands.  Depression screen [facility] 2/9 07/08/2018 12/24/2017 01/24/2017  Decreased Interest 0 0 2  Down, Depressed, Hopeless 0 1 3  PHQ - 2 Score 0 1 5  Altered sleeping 0 2 3  Tired, decreased energy 1 1 3   Change in appetite 1 2 2   Feeling bad or failure about yourself  0 2 2  Trouble concentrating 0 1 1  Moving slowly or fidgety/restless 0 1 3  Suicidal thoughts 0 0 1  PHQ-9 Score 2 10 20   Difficult doing work/chores Not difficult at all Somewhat difficult Very difficult    Assessment and Plan:   Yvette White was seen today for follow-up.  Diagnoses and all orders for this visit:  Idiopathic orbital inflammatory syndrome  Gastroesophageal reflux disease without esophagitis  Insulin resistance, A1c 5.5 on 07/25/17 -     Semaglutide,0.25 or 0.5MG /DOS, (OZEMPIC, 0.25 OR 0.5 MG/DOSE,) 2 MG/1.5ML SOPN; Inject 0.5 mg into the skin once a week.  High risk medication use - PREDNISONE, s/p recent gastric sleeve  Vitamin D deficiency  Attention deficit hyperactivity disorder (ADHD), predominantly inattentive type -     lisdexamfetamine (VYVANSE) 20 MG capsule; Take 1 capsule (20 mg total) by mouth daily.  Ocular pain, right eye   . COVID-19 Education: The signs and symptoms of COVID-19 were discussed with the patient and how to seek care for testing if needed. The importance of social distancing was discussed today. . Reviewed expectations re: course of current  medical issues. . Discussed self-management of symptoms. . Outlined signs and symptoms indicating need for more acute intervention. . Patient verbalized understanding and all questions were answered. Marland Kitchen Health Maintenance issues including appropriate healthy diet, exercise, and smoking avoidance were discussed with patient. . See orders for this visit as documented in the electronic medical record.  Briscoe Deutscher, DO  Records requested if needed. Time spent: 25 minutes, of which >50% was spent in obtaining information about her symptoms, reviewing her previous labs, evaluations, and treatments, counseling her about her condition (please see the discussed topics above), and developing a plan to further investigate it; she had a number of questions which I addressed.

## 2018-10-07 NOTE — Patient Instructions (Addendum)
Once you finish the Vitamin D weekly you can start taking 2,000 IU daily over the counter.   Lab order number to call for coverage of IGG testing.

## 2018-10-08 ENCOUNTER — Encounter: Payer: Self-pay | Admitting: Family Medicine

## 2018-10-09 ENCOUNTER — Telehealth: Payer: Self-pay | Admitting: Family Medicine

## 2018-10-09 ENCOUNTER — Encounter: Payer: Self-pay | Admitting: Family Medicine

## 2018-10-09 DIAGNOSIS — F4323 Adjustment disorder with mixed anxiety and depressed mood: Secondary | ICD-10-CM | POA: Diagnosis not present

## 2018-10-09 MED ORDER — SEMAGLUTIDE(0.25 OR 0.5MG/DOS) 2 MG/1.5ML ~~LOC~~ SOPN: 0.5000 mg | PEN_INJECTOR | SUBCUTANEOUS | 3 refills | Status: DC

## 2018-10-09 MED ORDER — LISDEXAMFETAMINE DIMESYLATE 20 MG PO CAPS: 20.0000 mg | ORAL_CAPSULE | Freq: Every day | ORAL | 0 refills | Status: DC

## 2018-10-09 NOTE — Telephone Encounter (Signed)
Copied from [place]. Topic: General - Inquiry >> Oct 09, 2018 10:58 AM Yvette White wrote: Reason for CRM: pt had a video visit with [name] on 10/07/2018. Pt states there were problems with the video visit and couldn't hear each other. Pt said she was to get a message from [name] and prescriptions were needing refilled. Pt asking how to finish visit and get information. Please advise.

## 2018-10-09 NOTE — Telephone Encounter (Signed)
Please advise, next available slot is not until Tuesday and patient is needing medication refills.

## 2018-10-09 NOTE — Telephone Encounter (Signed)
Mychart message sent to patient.

## 2018-10-16 ENCOUNTER — Other Ambulatory Visit: Payer: Self-pay

## 2018-10-16 DIAGNOSIS — Z20828 Contact with and (suspected) exposure to other viral communicable diseases: Secondary | ICD-10-CM

## 2018-10-16 DIAGNOSIS — Z20822 Contact with and (suspected) exposure to covid-19: Secondary | ICD-10-CM

## 2018-10-16 DIAGNOSIS — Z713 Dietary counseling and surveillance: Secondary | ICD-10-CM | POA: Diagnosis not present

## 2018-10-16 DIAGNOSIS — F4323 Adjustment disorder with mixed anxiety and depressed mood: Secondary | ICD-10-CM | POA: Diagnosis not present

## 2018-10-17 DIAGNOSIS — F4323 Adjustment disorder with mixed anxiety and depressed mood: Secondary | ICD-10-CM | POA: Diagnosis not present

## 2018-10-18 DIAGNOSIS — H0510 Unspecified chronic inflammatory disorders of orbit: Secondary | ICD-10-CM | POA: Diagnosis not present

## 2018-10-22 ENCOUNTER — Encounter: Payer: Self-pay | Admitting: Family Medicine

## 2018-10-22 DIAGNOSIS — F900 Attention-deficit hyperactivity disorder, predominantly inattentive type: Secondary | ICD-10-CM

## 2018-10-22 DIAGNOSIS — F4323 Adjustment disorder with mixed anxiety and depressed mood: Secondary | ICD-10-CM | POA: Diagnosis not present

## 2018-10-23 DIAGNOSIS — F4323 Adjustment disorder with mixed anxiety and depressed mood: Secondary | ICD-10-CM | POA: Diagnosis not present

## 2018-10-23 DIAGNOSIS — Z713 Dietary counseling and surveillance: Secondary | ICD-10-CM | POA: Diagnosis not present

## 2018-10-23 MED ORDER — LISDEXAMFETAMINE DIMESYLATE 40 MG PO CAPS: 40.0000 mg | ORAL_CAPSULE | Freq: Every day | ORAL | 0 refills | Status: DC

## 2018-10-29 DIAGNOSIS — Z713 Dietary counseling and surveillance: Secondary | ICD-10-CM | POA: Diagnosis not present

## 2018-10-30 DIAGNOSIS — F4323 Adjustment disorder with mixed anxiety and depressed mood: Secondary | ICD-10-CM | POA: Diagnosis not present

## 2018-11-06 ENCOUNTER — Telehealth: Payer: Self-pay

## 2018-11-06 DIAGNOSIS — F4323 Adjustment disorder with mixed anxiety and depressed mood: Secondary | ICD-10-CM | POA: Diagnosis not present

## 2018-11-06 DIAGNOSIS — Z713 Dietary counseling and surveillance: Secondary | ICD-10-CM | POA: Diagnosis not present

## 2018-11-06 NOTE — Telephone Encounter (Signed)
Copied from [place] [id number]. Topic: Appointment Scheduling - Scheduling Inquiry for Clinic >> Nov 06, 2018 11:09 AM Reyne Dumas L wrote: Reason for CRM:   Pt calling, thinks she is supposed to have a serology test done and would like to schedule that. Pt can be reached at [phone]

## 2018-11-07 DIAGNOSIS — F4323 Adjustment disorder with mixed anxiety and depressed mood: Secondary | ICD-10-CM | POA: Diagnosis not present

## 2018-11-08 ENCOUNTER — Ambulatory Visit (INDEPENDENT_AMBULATORY_CARE_PROVIDER_SITE_OTHER): Payer: BC Managed Care – PPO | Admitting: Family Medicine

## 2018-11-08 DIAGNOSIS — R110 Nausea: Secondary | ICD-10-CM

## 2018-11-08 NOTE — Progress Notes (Signed)
    Chief Complaint:  Yvette White is a 48 y.o. female who presents today for a virtual office visit with a chief complaint of nausea.   Assessment/Plan:  Nausea/morbid obesity Most likely medication side effect.  Advised patient to skip her dose dose of Ozempic to see if nausea improves.  If so, would consider reinitiating at lower dose versus switching to alternative.  If not, may need further work-up for her chronic nausea including possible referral to GI.  Advised her to follow-up with her PCP in the next 1 to 2 weeks.  Discussed reasons to return to care earlier.   Subjective:  HPI:  Nausea  Patient has had ongoing issues with nausea for the past several months.  She was started on Ozempic about 7 months ago.  Thinks that this could be the main contributor.  Symptoms seem to be worsening.  Symptoms seem to be worse at the beginning of the week.  Notes that she takes Ozempic every Sunday.  Symptoms gradually improve as the week progresses.  Symptoms seem to be worse in the morning.  They improve after eating and throughout the day.  No vomiting.  No fevers or chills.  No constipation or diarrhea.  No other treatment tried.  No other obvious alleviating or aggravating factors.  ROS: Per HPI  PMH: She reports that she quit smoking about 4 years ago. Her smoking use included cigarettes. She has a 5.00 pack-year smoking history. She has never used smokeless tobacco. She reports current alcohol use. She reports that she does not use drugs.      Objective/Observations  Physical Exam: Gen: NAD, resting comfortably Pulm: Normal work of breathing Neuro: Grossly normal, moves all extremities Psych: Normal affect and thought content  Virtual Visit via Video   I connected with Yvette White on 11/08/18 at  4:20 PM EDT by a video enabled telemedicine application and verified that I am speaking with the correct person using two identifiers. I discussed the limitations of evaluation and  management by telemedicine and the availability of in person appointments. The patient expressed understanding and agreed to proceed.   Patient location: Home Provider location: [place] participating in the virtual visit: Myself and Patient     Algis Greenhouse. Jerline Pain, MD 11/08/2018 4:41 PM

## 2018-11-12 NOTE — Telephone Encounter (Signed)
Ok to order 

## 2018-11-12 NOTE — Telephone Encounter (Signed)
Okay for test.

## 2018-11-13 DIAGNOSIS — F4323 Adjustment disorder with mixed anxiety and depressed mood: Secondary | ICD-10-CM | POA: Diagnosis not present

## 2018-11-13 DIAGNOSIS — Z713 Dietary counseling and surveillance: Secondary | ICD-10-CM | POA: Diagnosis not present

## 2018-11-14 NOTE — Telephone Encounter (Signed)
Mychart sent.

## 2018-11-20 DIAGNOSIS — Z713 Dietary counseling and surveillance: Secondary | ICD-10-CM | POA: Diagnosis not present

## 2018-11-21 DIAGNOSIS — F4323 Adjustment disorder with mixed anxiety and depressed mood: Secondary | ICD-10-CM | POA: Diagnosis not present

## 2018-11-27 ENCOUNTER — Encounter: Payer: Self-pay | Admitting: Family Medicine

## 2018-11-27 DIAGNOSIS — F4323 Adjustment disorder with mixed anxiety and depressed mood: Secondary | ICD-10-CM | POA: Diagnosis not present

## 2018-11-27 DIAGNOSIS — Z713 Dietary counseling and surveillance: Secondary | ICD-10-CM | POA: Diagnosis not present

## 2018-11-27 NOTE — Telephone Encounter (Signed)
Patient called to schedule labs however, all lab orders have been cancelled. Please advise and contact patient once orders are placed.  Copied from [place] [id number]. Topic: Quick Sport and exercise psychologist Patient (Clinic Use ONLY) >> Nov 27, 2018 11:38 AM Pauline Good wrote: Reason for CRM: pt need to sched labs

## 2018-11-27 NOTE — Telephone Encounter (Signed)
Left message to return call to our office.  

## 2018-11-28 ENCOUNTER — Other Ambulatory Visit: Payer: Self-pay

## 2018-11-28 DIAGNOSIS — Z20828 Contact with and (suspected) exposure to other viral communicable diseases: Secondary | ICD-10-CM

## 2018-11-28 NOTE — Telephone Encounter (Signed)
Called patient let her know I did call back yesterday and left message. App made and lab order placed.

## 2018-12-03 ENCOUNTER — Other Ambulatory Visit (INDEPENDENT_AMBULATORY_CARE_PROVIDER_SITE_OTHER): Payer: BC Managed Care – PPO

## 2018-12-03 ENCOUNTER — Other Ambulatory Visit: Payer: Self-pay

## 2018-12-03 DIAGNOSIS — F900 Attention-deficit hyperactivity disorder, predominantly inattentive type: Secondary | ICD-10-CM

## 2018-12-03 DIAGNOSIS — Z20828 Contact with and (suspected) exposure to other viral communicable diseases: Secondary | ICD-10-CM

## 2018-12-03 MED ORDER — BUPROPION HCL ER (XL) 300 MG PO TB24: 300.0000 mg | ORAL_TABLET | Freq: Every day | ORAL | 1 refills | Status: DC

## 2018-12-03 MED ORDER — LISDEXAMFETAMINE DIMESYLATE 40 MG PO CAPS: 40.0000 mg | ORAL_CAPSULE | Freq: Every day | ORAL | 0 refills | Status: DC

## 2018-12-03 NOTE — Telephone Encounter (Signed)
Please let the patient know that I sent in Vyvanse , 90d supply. My fault - I saw the Wellbutrin request but not the Vyvanse.

## 2018-12-03 NOTE — Telephone Encounter (Signed)
MEDICATION:  lisdexamfetamine (VYVANSE) 40 MG capsule  PHARMACY:   [id number] - [place], [place] - [address] AT [address] [phone] (Phone) [phone] (Fax)    IS THIS A 90 DAY SUPPLY : Y  IS PATIENT OUT OF MEDICATION: Y  IF NOT; HOW MUCH IS LEFT:   LAST APPOINTMENT DATE: @6 /04/2019  NEXT APPOINTMENT DATE:@ 12/24/2018  OTHER COMMENTS:    **Let patient know to contact pharmacy at the end of the day to make sure medication is ready. **  ** Please notify patient to allow 48-72 hours to process**  **Encourage patient to contact the pharmacy for refills or they can request refills through [facility]**

## 2018-12-03 NOTE — Telephone Encounter (Signed)
FYI

## 2018-12-03 NOTE — Addendum Note (Signed)
Addended by: Briscoe Deutscher R on: 12/03/2018 04:21 PM   Modules accepted: Orders

## 2018-12-03 NOTE — Telephone Encounter (Signed)
Patient called needs refill on Vyvanse

## 2018-12-03 NOTE — Telephone Encounter (Signed)
L/m to let patient know that refill sent in and apologized

## 2018-12-04 DIAGNOSIS — Z713 Dietary counseling and surveillance: Secondary | ICD-10-CM | POA: Diagnosis not present

## 2018-12-04 LAB — SAR COV2 SEROLOGY (COVID19)AB(IGG),IA: SARS CoV2 AB IGG: NEGATIVE

## 2018-12-05 DIAGNOSIS — F4323 Adjustment disorder with mixed anxiety and depressed mood: Secondary | ICD-10-CM | POA: Diagnosis not present

## 2018-12-10 DIAGNOSIS — Z713 Dietary counseling and surveillance: Secondary | ICD-10-CM | POA: Diagnosis not present

## 2018-12-11 DIAGNOSIS — H0510 Unspecified chronic inflammatory disorders of orbit: Secondary | ICD-10-CM | POA: Diagnosis not present

## 2018-12-11 DIAGNOSIS — Z79899 Other long term (current) drug therapy: Secondary | ICD-10-CM | POA: Diagnosis not present

## 2018-12-11 DIAGNOSIS — Z7952 Long term (current) use of systemic steroids: Secondary | ICD-10-CM | POA: Diagnosis not present

## 2018-12-12 DIAGNOSIS — F4323 Adjustment disorder with mixed anxiety and depressed mood: Secondary | ICD-10-CM | POA: Diagnosis not present

## 2018-12-18 DIAGNOSIS — Z713 Dietary counseling and surveillance: Secondary | ICD-10-CM | POA: Diagnosis not present

## 2018-12-18 DIAGNOSIS — F4323 Adjustment disorder with mixed anxiety and depressed mood: Secondary | ICD-10-CM | POA: Diagnosis not present

## 2018-12-24 ENCOUNTER — Ambulatory Visit (INDEPENDENT_AMBULATORY_CARE_PROVIDER_SITE_OTHER): Payer: BC Managed Care – PPO | Admitting: Family Medicine

## 2018-12-24 ENCOUNTER — Other Ambulatory Visit: Payer: Self-pay

## 2018-12-24 VITALS — BP 126/74 | HR 75 | Temp 97.9°F | Ht 65.0 in | Wt 293.6 lb

## 2018-12-24 DIAGNOSIS — F900 Attention-deficit hyperactivity disorder, predominantly inattentive type: Secondary | ICD-10-CM

## 2018-12-24 DIAGNOSIS — E88819 Insulin resistance, unspecified: Secondary | ICD-10-CM

## 2018-12-24 DIAGNOSIS — E8881 Metabolic syndrome: Secondary | ICD-10-CM | POA: Diagnosis not present

## 2018-12-24 DIAGNOSIS — F319 Bipolar disorder, unspecified: Secondary | ICD-10-CM

## 2018-12-24 DIAGNOSIS — F418 Other specified anxiety disorders: Secondary | ICD-10-CM

## 2018-12-24 DIAGNOSIS — K219 Gastro-esophageal reflux disease without esophagitis: Secondary | ICD-10-CM

## 2018-12-24 DIAGNOSIS — Z79899 Other long term (current) drug therapy: Secondary | ICD-10-CM

## 2018-12-24 DIAGNOSIS — H0510 Unspecified chronic inflammatory disorders of orbit: Secondary | ICD-10-CM

## 2018-12-24 MED ORDER — PANTOPRAZOLE SODIUM 40 MG PO TBEC: 40.0000 mg | DELAYED_RELEASE_TABLET | Freq: Every day | ORAL | 3 refills | Status: DC

## 2018-12-24 NOTE — Progress Notes (Signed)
Yvette White is a 48 y.o. female is here for follow up.  History of Present Illness:   Lonell Grandchild, CMA acting as scribe for Dr. Briscoe Deutscher.   HPI: Unfortunately, weight is continuing to increase - up 20 pounds since last visit. Still seeing therapist - wants her to increase calories to above 2000. She is eating more carbohydrates. Feeling more tired. Felt much better and had more success with MWM.   Prednisone decreased to 20 mg po daily for now. Plan is to wean off completely and see what does of [place] is needed to control ocular symptoms. Had blood work and bone density at [facility] last week.  Unhappy with psychiatrist and would like to change.   Ozempic caused too much nausea. Stopped. Would be interested in alternative.   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  Medications follow up. Currently on Vyvanse. Does not feel that it has been as helpful as Adderall was in the past.  Since the last visit has the patient had any:  Appetite changes? No Unintentional weight loss? No Is medication working well ? Yes Does patient take drug holidays? No Difficulties falling to sleep or maintaining sleep? No Any anxiety?  No Any cardiac issues (fainting or paliptations)? No Suicidal thoughts? No Changes in health since last visit? No New medications? No Any illicit substance abuse? No Has the patient taken his medication today? Yes  She cannot remember the dose of Adderall and will send a message.  There are no preventive care reminders to display for this patient.   Depression screen [facility] 2/9 12/24/2018 07/08/2018 12/24/2017  Decreased Interest 1 0 0  Down, Depressed, Hopeless 1 0 1  PHQ - 2 Score 2 0 1  Altered sleeping 0 0 2  Tired, decreased energy 1 1 1   Change in appetite 2 1 2   Feeling bad or failure about yourself  1 0 2  Trouble concentrating 2 0 1  Moving slowly or fidgety/restless 0 0 1    Suicidal thoughts 0 0 0  PHQ-9 Score 8 2 10   Difficult doing work/chores Not difficult at all Not difficult at all Somewhat difficult   PMHx, SurgHx, SocialHx, FamHx, Medications, and Allergies were reviewed in the Visit Navigator and updated as appropriate.   Patient Active Problem List   Diagnosis Date Noted  . Situational anxiety 12/24/2018  . 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 Migraine without aura and without status migrainosus, not intractable 12/31/2017  . Chronic thoracic back pain 09/27/2017  . Low back pain 09/27/2017  . Former smoker, quit in 2016 07/29/2017  . Low HDL (under 40) 07/29/2017  . Bladder spasms 07/29/2017  . History of abnormal mammogram 07/29/2017  . Thyroid condition, history of low TSH that normalized with monitoring 07/29/2017  . High risk medication use - PREDNISONE x 3, s/p recent gastric sleeve 07/29/2017  . ADD (attention deficit disorder)   . OSA on CPAP   . Vitamin D deficiency, on 50K IU weekly 05/02/2017  . Insulin resistance, A1c 5.5 on 07/25/17 05/02/2017  . 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 Bipolar 1 disorder ([place]), followed by Psych, controlled with Lamictal 01/24/2017  . Morbid obesity ([place]), s/p gastric sleeve, at time of surgery - weight 382, BMI 61 07/13/2016  . Abnormal menses 03/06/2016  . GERD (gastroesophageal reflux disease), on Prilosec 03/06/2016  . History of neurocardiogenic syncope,  no episodes since 2017 03/06/2016  . Pharyngoesophageal dysphagia 03/06/2016  . Seasonal allergies, controlled with Claritin 08/25/2013   Social History   Tobacco Use  . Smoking status: Former Smoker    Packs/day: 0.25    Years: 20.00    Pack years: 5.00    Types: Cigarettes    Quit date: 09/30/2014    Years since quitting: 4.2  . Smokeless tobacco: Never Used  Substance Use Topics  . Alcohol use: Yes    Comment: occ  . Drug use: No   Current Medications and Allergies   .  acetaminophen  (TYLENOL) 500 MG tablet, Take 1,000 mg by mouth every 6 (six) hours as needed (for pain.)., Disp: , Rfl:  .  Adalimumab 40 MG/0.4ML PSKT, Inject into the skin., Disp: , Rfl:  .  Azelastine-Fluticasone 137-50 MCG/ACT SUSP, Place 1 spray into the nose daily., Disp: 23 g, Rfl: 1 .  buPROPion (WELLBUTRIN XL) 300 MG 24 hr tablet, Take 1 tablet (300 mg total) by mouth daily., Disp: 90 tablet, Rfl: 1 .  Calcium Carbonate Antacid (ANTACID PO), Take 500 mg by mouth 3 (three) times daily. , Disp: , Rfl:  .  lamoTRIgine (LAMICTAL) 200 MG tablet, Take 200 mg by mouth every evening. , Disp: , Rfl:  .  lisdexamfetamine (VYVANSE) 40 MG capsule, Take 1 capsule (40 mg total) by mouth daily., Disp: 90 capsule, Rfl: 0 .  loratadine (CLARITIN) 10 MG tablet, Take 10 mg by mouth every evening., Disp: , Rfl:  .  Meth-Hyo-M Bl-Na Phos-Ph Sal (URIBEL) 118 MG CAPS, One po QID prn bladder spasm, Disp: 30 capsule, Rfl: 0 .  Methotrexate Sodium (METHOTREXATE, PF,) 50 MG/2ML injection, once a week., Disp: , Rfl: 2 .  Multiple Vitamins-Minerals (CELEBRATE MULTI-COMPLETE 18 PO), Take by mouth., Disp: , Rfl:  .  ondansetron (ZOFRAN) 4 MG tablet, Take 1 tablet (4 mg total) by mouth every 8 (eight) hours as needed for nausea or vomiting., Disp: 20 tablet, Rfl: 0 .  pantoprazole (PROTONIX) 40 MG tablet, Take 40 mg by mouth daily., Disp: , Rfl: 3 .  predniSONE (DELTASONE) 20 MG tablet, , Disp: , Rfl:  .  Vitamin D, Ergocalciferol, (DRISDOL) 1.25 MG (50000 UT) CAPS capsule, One capsule by mouth once a week for 8 weeks. Then take 1000IU/day, Disp: 8 capsule, Rfl: 0   Allergies  Allergen Reactions  . Theophylline     Other reaction(s): Other (See Comments) Hyperactive  "Slo phylinne"  . Penicillins Rash    Has patient had a PCN reaction causing immediate rash, facial/tongue/throat swelling, SOB or lightheadedness with hypotension:unsure Has patient had a PCN reaction causing severe rash involving mucus membranes or skin  necrosis:unsure Has patient had a PCN reaction that required hospitalization:No Has patient had a PCN reaction occurring within the last 10 years: No If all of the above answers are "NO", then may proceed with Cephalosporin use.   . Pseudoephedrine Rash  . Sulfamethoxazole-Trimethoprim Rash   Review of Systems   Pertinent items are noted in the HPI. Otherwise, a complete ROS is negative.  Vitals   Vitals:   12/24/18 1142  BP: 126/74  Pulse: 75  Temp: 97.9 F (36.6 C)  TempSrc: Oral  SpO2: 98%  Weight: 293 lb 9.6 oz (133.2 kg)  Height: 5\' 5"  (1.651 m)     Body mass index is 48.86 kg/m.  Physical Exam   Physical Exam Vitals signs and nursing note reviewed.  HENT:     Head: Normocephalic and atraumatic.  Eyes:     Pupils: Pupils are equal, round, and reactive to light.  Neck:     Musculoskeletal: Normal range of motion and neck supple.  Cardiovascular:     Rate and Rhythm: Normal rate and regular rhythm.     Heart sounds: Normal heart sounds.  Pulmonary:     Effort: Pulmonary effort is normal.  Abdominal:     Palpations: Abdomen is soft.  Skin:    General: Skin is warm.  Psychiatric:        Behavior: Behavior normal.     Assessment and Plan   Lisbet was seen today for medication refill.  Diagnoses and all orders for this visit:  Insulin resistance, A1c 5.5 on 07/25/17 Comments: Did not tolerate Ozempic. Would like to try Rybelsus. Sample provided.   Morbid obesity ([place]), s/p gastric sleeve, at time of surgery - weight 382, BMI 61  High risk medication use - PREDNISONE x 3, s/p recent gastric sleeve  Idiopathic orbital inflammatory syndrome  Attention deficit hyperactivity disorder (ADHD), predominantly inattentive type Comments: Patient will call with previous Adderall dosage.  Bipolar 1 disorder ([place]), followed by Psych, controlled with Lamictal  Situational anxiety  Gastroesophageal reflux disease without esophagitis -     pantoprazole  (PROTONIX) 40 MG tablet; Take 1 tablet (40 mg total) by mouth daily.    . Orders and follow up as documented in [place], reviewed diet, exercise and weight control, cardiovascular risk and specific lipid/LDL goals reviewed, reviewed medications and side effects in detail.  . Reviewed expectations re: course of current medical issues. . Outlined signs and symptoms indicating need for more acute intervention. . Patient verbalized understanding and all questions were answered. . Patient received an After Visit Summary.  Briscoe Deutscher, DO [place], [facility] 12/25/2018

## 2018-12-25 ENCOUNTER — Encounter: Payer: Self-pay | Admitting: Family Medicine

## 2018-12-25 DIAGNOSIS — F4323 Adjustment disorder with mixed anxiety and depressed mood: Secondary | ICD-10-CM | POA: Diagnosis not present

## 2018-12-25 DIAGNOSIS — Z713 Dietary counseling and surveillance: Secondary | ICD-10-CM | POA: Diagnosis not present

## 2019-01-07 ENCOUNTER — Other Ambulatory Visit: Payer: Self-pay | Admitting: Family Medicine

## 2019-01-07 DIAGNOSIS — Z79899 Other long term (current) drug therapy: Secondary | ICD-10-CM | POA: Diagnosis not present

## 2019-01-07 DIAGNOSIS — H0510 Unspecified chronic inflammatory disorders of orbit: Secondary | ICD-10-CM | POA: Diagnosis not present

## 2019-01-07 DIAGNOSIS — Z7952 Long term (current) use of systemic steroids: Secondary | ICD-10-CM | POA: Diagnosis not present

## 2019-01-07 DIAGNOSIS — F4323 Adjustment disorder with mixed anxiety and depressed mood: Secondary | ICD-10-CM | POA: Diagnosis not present

## 2019-01-07 NOTE — Telephone Encounter (Signed)
Last fill 12/03/18  #90/1 Last OV 12/24/18 Next OV  03/26/19

## 2019-01-08 DIAGNOSIS — Z713 Dietary counseling and surveillance: Secondary | ICD-10-CM | POA: Diagnosis not present

## 2019-01-09 DIAGNOSIS — H02834 Dermatochalasis of left upper eyelid: Secondary | ICD-10-CM | POA: Diagnosis not present

## 2019-01-09 DIAGNOSIS — H0510 Unspecified chronic inflammatory disorders of orbit: Secondary | ICD-10-CM | POA: Diagnosis not present

## 2019-01-09 DIAGNOSIS — H02831 Dermatochalasis of right upper eyelid: Secondary | ICD-10-CM | POA: Diagnosis not present

## 2019-01-14 DIAGNOSIS — Z713 Dietary counseling and surveillance: Secondary | ICD-10-CM | POA: Diagnosis not present

## 2019-01-15 DIAGNOSIS — F4323 Adjustment disorder with mixed anxiety and depressed mood: Secondary | ICD-10-CM | POA: Diagnosis not present

## 2019-01-22 DIAGNOSIS — F4323 Adjustment disorder with mixed anxiety and depressed mood: Secondary | ICD-10-CM | POA: Diagnosis not present

## 2019-02-04 ENCOUNTER — Telehealth: Payer: Self-pay

## 2019-02-04 ENCOUNTER — Encounter: Payer: Self-pay | Admitting: Family Medicine

## 2019-02-04 NOTE — Telephone Encounter (Signed)
Copied from [place] [id number]. Topic: General - Other >> Feb 04, 2019  3:02 PM Rainey Pines A wrote: Patient stated that she would like to speak with [name] nurse in regards to if she should get a covid test done. Patient isn't experiencing any symptoms but was around someone yesterday that has symptoms. Patient would like a callback at [phone]

## 2019-02-04 NOTE — Telephone Encounter (Signed)
Added from my chart message.  Good evening-I called late this afternoon/Tuesday. The woman that took my call said she would send a message to Dr Alcario Drought nurse (high priority) and someone would get back to me. I have heard nothing.    My question is: Do I need to go get a Covid test? If so, how/where? My hope is I will not need a test or quarantine. ~I had a massage yesterday/Monday ~the therapist started running a fever 4 hours later ~her fever is down today/Tuesday  ~her only symptoms are chills, body aches which I expect would be from the fever and a headache.  ~I'm personally not running a fever or have symptoms  ~we both wore masks ~I showered right when I got home ~ She is a mask wearer and has no knowledge of being exposed to Covid.  Do I need to do anything or am I able to continue my daily activities (work)? Dr Juleen China is aware of mine and my mother's health. Please could someone get back to me for this is important I get taken care of. Thank you! Alois Cliche

## 2019-02-04 NOTE — Telephone Encounter (Signed)
Left message to return call to our office.  

## 2019-02-05 NOTE — Telephone Encounter (Signed)
Send for COVID test this week. Isolate until results back. If symptoms develop after, needs new test due to longer incubation period. Okay work note if needed. This is ironic - I was sure that her exposure would be at school.

## 2019-02-06 ENCOUNTER — Other Ambulatory Visit: Payer: Self-pay

## 2019-02-06 DIAGNOSIS — Z20822 Contact with and (suspected) exposure to covid-19: Secondary | ICD-10-CM

## 2019-02-06 DIAGNOSIS — Z20828 Contact with and (suspected) exposure to other viral communicable diseases: Secondary | ICD-10-CM

## 2019-02-06 DIAGNOSIS — Z713 Dietary counseling and surveillance: Secondary | ICD-10-CM | POA: Diagnosis not present

## 2019-02-06 NOTE — Telephone Encounter (Signed)
Called patient no note needed. I have put in order and sent my chart message on locations and times for testing.

## 2019-02-07 DIAGNOSIS — F4323 Adjustment disorder with mixed anxiety and depressed mood: Secondary | ICD-10-CM | POA: Diagnosis not present

## 2019-02-10 DIAGNOSIS — Z20828 Contact with and (suspected) exposure to other viral communicable diseases: Secondary | ICD-10-CM | POA: Diagnosis not present

## 2019-02-17 DIAGNOSIS — F4323 Adjustment disorder with mixed anxiety and depressed mood: Secondary | ICD-10-CM | POA: Diagnosis not present

## 2019-02-18 DIAGNOSIS — Z713 Dietary counseling and surveillance: Secondary | ICD-10-CM | POA: Diagnosis not present

## 2019-02-20 DIAGNOSIS — F3189 Other bipolar disorder: Secondary | ICD-10-CM | POA: Diagnosis not present

## 2019-02-20 DIAGNOSIS — F900 Attention-deficit hyperactivity disorder, predominantly inattentive type: Secondary | ICD-10-CM | POA: Diagnosis not present

## 2019-02-21 DIAGNOSIS — Z7189 Other specified counseling: Secondary | ICD-10-CM | POA: Diagnosis not present

## 2019-02-21 DIAGNOSIS — Z20828 Contact with and (suspected) exposure to other viral communicable diseases: Secondary | ICD-10-CM | POA: Diagnosis not present

## 2019-02-25 DIAGNOSIS — Z713 Dietary counseling and surveillance: Secondary | ICD-10-CM | POA: Diagnosis not present

## 2019-02-27 DIAGNOSIS — F4323 Adjustment disorder with mixed anxiety and depressed mood: Secondary | ICD-10-CM | POA: Diagnosis not present

## 2019-03-04 DIAGNOSIS — Z713 Dietary counseling and surveillance: Secondary | ICD-10-CM | POA: Diagnosis not present

## 2019-03-06 DIAGNOSIS — F4323 Adjustment disorder with mixed anxiety and depressed mood: Secondary | ICD-10-CM | POA: Diagnosis not present

## 2019-03-11 DIAGNOSIS — Z713 Dietary counseling and surveillance: Secondary | ICD-10-CM | POA: Diagnosis not present

## 2019-03-13 DIAGNOSIS — Z20828 Contact with and (suspected) exposure to other viral communicable diseases: Secondary | ICD-10-CM | POA: Diagnosis not present

## 2019-03-13 DIAGNOSIS — Z03818 Encounter for observation for suspected exposure to other biological agents ruled out: Secondary | ICD-10-CM | POA: Diagnosis not present

## 2019-03-13 DIAGNOSIS — Z7189 Other specified counseling: Secondary | ICD-10-CM | POA: Diagnosis not present

## 2019-03-20 DIAGNOSIS — F4323 Adjustment disorder with mixed anxiety and depressed mood: Secondary | ICD-10-CM | POA: Diagnosis not present

## 2019-03-25 DIAGNOSIS — Z713 Dietary counseling and surveillance: Secondary | ICD-10-CM | POA: Diagnosis not present

## 2019-03-26 ENCOUNTER — Ambulatory Visit: Payer: BC Managed Care – PPO | Admitting: Family Medicine

## 2019-03-27 DIAGNOSIS — F4323 Adjustment disorder with mixed anxiety and depressed mood: Secondary | ICD-10-CM | POA: Diagnosis not present

## 2019-04-01 DIAGNOSIS — Z713 Dietary counseling and surveillance: Secondary | ICD-10-CM | POA: Diagnosis not present

## 2019-04-01 DIAGNOSIS — F4323 Adjustment disorder with mixed anxiety and depressed mood: Secondary | ICD-10-CM | POA: Diagnosis not present

## 2019-04-08 DIAGNOSIS — Z713 Dietary counseling and surveillance: Secondary | ICD-10-CM | POA: Diagnosis not present

## 2019-04-10 DIAGNOSIS — F4323 Adjustment disorder with mixed anxiety and depressed mood: Secondary | ICD-10-CM | POA: Diagnosis not present

## 2019-04-11 DIAGNOSIS — F4323 Adjustment disorder with mixed anxiety and depressed mood: Secondary | ICD-10-CM | POA: Diagnosis not present

## 2019-04-14 ENCOUNTER — Encounter: Payer: Self-pay | Admitting: Family Medicine

## 2019-04-14 ENCOUNTER — Other Ambulatory Visit: Payer: Self-pay

## 2019-04-14 ENCOUNTER — Ambulatory Visit (INDEPENDENT_AMBULATORY_CARE_PROVIDER_SITE_OTHER): Payer: BC Managed Care – PPO | Admitting: Family Medicine

## 2019-04-14 VITALS — BP 94/66 | HR 61 | Temp 98.4°F | Ht 65.0 in | Wt 307.2 lb

## 2019-04-14 DIAGNOSIS — Z7952 Long term (current) use of systemic steroids: Secondary | ICD-10-CM

## 2019-04-14 DIAGNOSIS — E079 Disorder of thyroid, unspecified: Secondary | ICD-10-CM

## 2019-04-14 DIAGNOSIS — E8881 Metabolic syndrome: Secondary | ICD-10-CM | POA: Diagnosis not present

## 2019-04-14 DIAGNOSIS — E559 Vitamin D deficiency, unspecified: Secondary | ICD-10-CM | POA: Diagnosis not present

## 2019-04-14 DIAGNOSIS — Z23 Encounter for immunization: Secondary | ICD-10-CM | POA: Diagnosis not present

## 2019-04-14 DIAGNOSIS — N951 Menopausal and female climacteric states: Secondary | ICD-10-CM | POA: Diagnosis not present

## 2019-04-14 LAB — COMPREHENSIVE METABOLIC PANEL
ALT: 30 U/L (ref 0–35)
AST: 18 U/L (ref 0–37)
Albumin: 3.9 g/dL (ref 3.5–5.2)
Alkaline Phosphatase: 80 U/L (ref 39–117)
BUN: 17 mg/dL (ref 6–23)
CO2: 29 mEq/L (ref 19–32)
Calcium: 8.8 mg/dL (ref 8.4–10.5)
Chloride: 102 mEq/L (ref 96–112)
Creatinine, Ser: 0.8 mg/dL (ref 0.40–1.20)
GFR: 76.95 mL/min (ref >60.00)
Glucose, Bld: 91 mg/dL (ref 70–99)
Potassium: 4.2 mEq/L (ref 3.5–5.1)
Sodium: 138 mEq/L (ref 135–145)
Total Bilirubin: 0.8 mg/dL (ref 0.2–1.2)
Total Protein: 6.3 g/dL (ref 6.0–8.3)

## 2019-04-14 LAB — LIPID PANEL
Cholesterol: 167 mg/dL (ref 0–200)
HDL: 75.9 mg/dL (ref >39.00)
LDL Cholesterol: 76 mg/dL (ref 0–99)
NonHDL: 91.51
Total CHOL/HDL Ratio: 2
Triglycerides: 79 mg/dL (ref 0.0–149.0)
VLDL: 15.8 mg/dL (ref 0.0–40.0)

## 2019-04-14 LAB — CBC WITH DIFFERENTIAL/PLATELET
Basophils Absolute: 0.1 10*3/uL (ref 0.0–0.1)
Basophils Relative: 0.9 % (ref 0.0–3.0)
Eosinophils Absolute: 0.3 10*3/uL (ref 0.0–0.7)
Eosinophils Relative: 2.5 % (ref 0.0–5.0)
HCT: 38.1 % (ref 36.0–46.0)
Hemoglobin: 12.5 g/dL (ref 12.0–15.0)
Lymphocytes Relative: 28 % (ref 12.0–46.0)
Lymphs Abs: 3.2 10*3/uL (ref 0.7–4.0)
MCHC: 32.9 g/dL (ref 30.0–36.0)
MCV: 92.1 fl (ref 78.0–100.0)
Monocytes Absolute: 0.7 10*3/uL (ref 0.1–1.0)
Monocytes Relative: 6.5 % (ref 3.0–12.0)
Neutro Abs: 7 10*3/uL (ref 1.4–7.7)
Neutrophils Relative %: 62.1 % (ref 43.0–77.0)
Platelets: 319 10*3/uL (ref 150.0–400.0)
RBC: 4.14 Mil/uL (ref 3.87–5.11)
RDW: 14.7 % (ref 11.5–15.5)
WBC: 11.3 10*3/uL — ABNORMAL HIGH (ref 4.0–10.5)

## 2019-04-14 LAB — HEMOGLOBIN A1C: Hgb A1c MFr Bld: 5.7 % (ref 4.6–6.5)

## 2019-04-14 LAB — VITAMIN D 25 HYDROXY (VIT D DEFICIENCY, FRACTURES): VITD: 30.17 ng/mL (ref 30.00–100.00)

## 2019-04-14 LAB — TSH: TSH: 2.68 u[IU]/mL (ref 0.35–4.50)

## 2019-04-14 NOTE — Patient Instructions (Signed)
Labs today- let's follow this every 6 months....   Let me know when you need refills!  So good to see you!  [name]

## 2019-04-14 NOTE — Progress Notes (Signed)
Patient: Yvette White MRN: [id number] DOB: [birth year] PCP: Briscoe Deutscher, DO     Subjective:  Chief Complaint  Patient presents with  . Hyperglycemia    insulin resistance    HPI: The patient is a 48 y.o. female who presents today for f/u and fasting labs. She has history of insulin resistance, vit. D deficiency and low HDL. She is on chronic steroids as well. She has gained 50 pounds this year and is frustrated as she had a gastric sleeve done in 2018.   Insulin resistance and chronic steroid use: needs monitoring of the A1c and labs. Will check today, she is fasting, but has gained 50 pounds. She has had her bone scan done at [facility] which was normal.   Abnormal thyroid tests: has been abnormal in the past, but normalized. On no medication.   She also has hot flashes all of the time. She is constantly hot and her cheeks are red all of the time. She doesn't sweat except under her breasts. No easy bruising, no fatigue, no night sweats. Periods abnormal.   Review of Systems  Constitutional: Negative for fatigue.  HENT: Negative for congestion, postnasal drip, rhinorrhea and sore throat.   Eyes: Negative for visual disturbance.  Respiratory: Negative for shortness of breath.   Cardiovascular: Negative for chest pain, palpitations and leg swelling.  Gastrointestinal: Negative for abdominal pain, diarrhea, nausea and vomiting.  Endocrine: Positive for heat intolerance. Negative for cold intolerance, polydipsia and polyuria.  Skin: Negative for rash.  Neurological: Negative for dizziness and headaches.  Psychiatric/Behavioral: Positive for sleep disturbance.    Allergies Patient is allergic to theophylline; penicillins; pseudoephedrine; and sulfamethoxazole-trimethoprim.  Past Medical History Patient  has a past medical history of ADD (attention deficit disorder), controlled with Adderall, Anemia, Anxiety, Arthritis, Chicken pox, Depression, Disorder of thyroid, GERD  (gastroesophageal reflux disease), Heart murmur, Hyperthyroidism, Menstrual disorder, Migraine, Morbid obesity ([place]), Neurocardiogenic syncope, OSA on CPAP, Pre-diabetes, Swallowing difficulty, and Vitamin D deficiency.  Surgical History Patient  has a past surgical history that includes Urethral dilation (1979); Tilt table study; Wisdom tooth extraction; Dilatation & curettage/hysteroscopy with myosure (N/A, 06/08/2016); and Laparoscopic gastric sleeve resection (N/A, 02/20/2017).  Family History Pateint's family history includes Atrial fibrillation in her maternal grandmother; Cancer in her mother; Colon cancer (age of onset: 33) in her maternal grandfather; Depression in her mother; Diabetes in her father and mother; Heart attack (age of onset: 73) in her father; Heart disease in her father, mother, paternal grandfather, and paternal grandmother; Hyperlipidemia in her father and mother; Hypertension in her father and mother; Kidney disease in her mother; Obesity in her father and mother; Parkinson's disease in her maternal grandmother; Sleep apnea in her mother; Thyroid disease in her mother; Uterine cancer in her mother.  Social History Patient  reports that she quit smoking about 4 years ago. Her smoking use included cigarettes. She has a 5.00 pack-year smoking history. She has never used smokeless tobacco. She reports current alcohol use. She reports that she does not use drugs.    Objective: Vitals:   04/14/19 1305  BP: 94/66  Pulse: 61  Temp: 98.4 F (36.9 C)  TempSrc: Skin  SpO2: 99%  Weight: (!) 307 lb 3.2 oz (139.3 kg)  Height: 5\' 5"  (1.651 m)    Body mass index is 51.12 kg/m.  Physical Exam Vitals signs reviewed.  Constitutional:      General: She is not in acute distress.    Appearance: She is obese. She is not  ill-appearing.  HENT:     Head: Normocephalic and atraumatic.     Right Ear: Tympanic membrane, ear canal and external ear normal.     Left Ear: Tympanic  membrane, ear canal and external ear normal.     Nose: Nose normal.     Mouth/Throat:     Mouth: Mucous membranes are moist.  Neck:     Musculoskeletal: Normal range of motion and neck supple.     Comments: Thyroid normal  Cardiovascular:     Rate and Rhythm: Normal rate and regular rhythm.     Heart sounds: Normal heart sounds.  Pulmonary:     Effort: Pulmonary effort is normal.     Breath sounds: Normal breath sounds.  Abdominal:     General: Bowel sounds are normal.     Palpations: Abdomen is soft.  Skin:    General: Skin is warm and dry.     Capillary Refill: Capillary refill takes less than 2 seconds.  Neurological:     General: No focal deficit present.     Mental Status: She is alert and oriented to person, place, and time.  Psychiatric:        Mood and Affect: Mood normal.        Behavior: Behavior normal.        Assessment/plan: 1. Insulin resistance, A1c 5.5 on 07/25/17 Check labs today. a1c has been controlled in the past. Discussed close monitoring with chronic steroid use and really encouraged more exercise. Monitor q 6 months.  - Comprehensive metabolic panel - CBC with Differential/Platelet - Hemoglobin A1c - Lipid panel  2. Thyroid condition, history of low TSH that normalized with monitoring  - TSH  3. Vitamin D deficiency  - VITAMIN D 25 Hydroxy (Vit-D Deficiency, Fractures)  4. Current chronic use of systemic steroids utd on her bone scan, done at [facility] and normal. Checking a1c/lipid panel. Has weaned down, but 2 year hx of steroids.    Return in about 6 months (around 10/12/2019).   Orma Flaming, MD [place]   04/14/2019

## 2019-04-15 DIAGNOSIS — Z713 Dietary counseling and surveillance: Secondary | ICD-10-CM | POA: Diagnosis not present

## 2019-04-15 LAB — FSH/LH
FSH: 5.6 m[IU]/mL
LH: 5 m[IU]/mL

## 2019-04-16 ENCOUNTER — Encounter: Payer: Self-pay | Admitting: Family Medicine

## 2019-04-17 DIAGNOSIS — F4323 Adjustment disorder with mixed anxiety and depressed mood: Secondary | ICD-10-CM | POA: Diagnosis not present

## 2019-04-22 DIAGNOSIS — Z20828 Contact with and (suspected) exposure to other viral communicable diseases: Secondary | ICD-10-CM | POA: Diagnosis not present

## 2019-04-29 DIAGNOSIS — F4323 Adjustment disorder with mixed anxiety and depressed mood: Secondary | ICD-10-CM | POA: Diagnosis not present

## 2019-04-29 DIAGNOSIS — Z713 Dietary counseling and surveillance: Secondary | ICD-10-CM | POA: Diagnosis not present

## 2019-04-30 DIAGNOSIS — H0510 Unspecified chronic inflammatory disorders of orbit: Secondary | ICD-10-CM | POA: Diagnosis not present

## 2019-05-01 DIAGNOSIS — F4323 Adjustment disorder with mixed anxiety and depressed mood: Secondary | ICD-10-CM | POA: Diagnosis not present

## 2019-05-06 DIAGNOSIS — F4323 Adjustment disorder with mixed anxiety and depressed mood: Secondary | ICD-10-CM | POA: Diagnosis not present

## 2019-05-06 DIAGNOSIS — Z713 Dietary counseling and surveillance: Secondary | ICD-10-CM | POA: Diagnosis not present

## 2019-05-08 DIAGNOSIS — F4323 Adjustment disorder with mixed anxiety and depressed mood: Secondary | ICD-10-CM | POA: Diagnosis not present

## 2019-05-13 DIAGNOSIS — Z713 Dietary counseling and surveillance: Secondary | ICD-10-CM | POA: Diagnosis not present

## 2019-05-13 DIAGNOSIS — F4323 Adjustment disorder with mixed anxiety and depressed mood: Secondary | ICD-10-CM | POA: Diagnosis not present

## 2019-05-15 DIAGNOSIS — F4323 Adjustment disorder with mixed anxiety and depressed mood: Secondary | ICD-10-CM | POA: Diagnosis not present

## 2019-05-20 DIAGNOSIS — F4323 Adjustment disorder with mixed anxiety and depressed mood: Secondary | ICD-10-CM | POA: Diagnosis not present

## 2019-05-29 DIAGNOSIS — F4323 Adjustment disorder with mixed anxiety and depressed mood: Secondary | ICD-10-CM | POA: Diagnosis not present

## 2019-06-03 DIAGNOSIS — F4323 Adjustment disorder with mixed anxiety and depressed mood: Secondary | ICD-10-CM | POA: Diagnosis not present

## 2019-06-03 DIAGNOSIS — Z713 Dietary counseling and surveillance: Secondary | ICD-10-CM | POA: Diagnosis not present

## 2019-06-05 DIAGNOSIS — F4323 Adjustment disorder with mixed anxiety and depressed mood: Secondary | ICD-10-CM | POA: Diagnosis not present

## 2019-06-10 DIAGNOSIS — F4323 Adjustment disorder with mixed anxiety and depressed mood: Secondary | ICD-10-CM | POA: Diagnosis not present

## 2019-06-10 DIAGNOSIS — Z713 Dietary counseling and surveillance: Secondary | ICD-10-CM | POA: Diagnosis not present

## 2019-06-12 DIAGNOSIS — F4323 Adjustment disorder with mixed anxiety and depressed mood: Secondary | ICD-10-CM | POA: Diagnosis not present

## 2019-06-17 DIAGNOSIS — Z713 Dietary counseling and surveillance: Secondary | ICD-10-CM | POA: Diagnosis not present

## 2019-06-17 DIAGNOSIS — F4323 Adjustment disorder with mixed anxiety and depressed mood: Secondary | ICD-10-CM | POA: Diagnosis not present

## 2019-06-24 DIAGNOSIS — F4323 Adjustment disorder with mixed anxiety and depressed mood: Secondary | ICD-10-CM | POA: Diagnosis not present

## 2019-06-24 DIAGNOSIS — Z713 Dietary counseling and surveillance: Secondary | ICD-10-CM | POA: Diagnosis not present

## 2019-06-26 DIAGNOSIS — F4323 Adjustment disorder with mixed anxiety and depressed mood: Secondary | ICD-10-CM | POA: Diagnosis not present

## 2019-07-01 DIAGNOSIS — Z713 Dietary counseling and surveillance: Secondary | ICD-10-CM | POA: Diagnosis not present

## 2019-07-01 DIAGNOSIS — F4323 Adjustment disorder with mixed anxiety and depressed mood: Secondary | ICD-10-CM | POA: Diagnosis not present

## 2019-07-03 DIAGNOSIS — F4323 Adjustment disorder with mixed anxiety and depressed mood: Secondary | ICD-10-CM | POA: Diagnosis not present

## 2019-07-08 DIAGNOSIS — F4323 Adjustment disorder with mixed anxiety and depressed mood: Secondary | ICD-10-CM | POA: Diagnosis not present

## 2019-07-08 DIAGNOSIS — Z713 Dietary counseling and surveillance: Secondary | ICD-10-CM | POA: Diagnosis not present

## 2019-07-10 DIAGNOSIS — F4323 Adjustment disorder with mixed anxiety and depressed mood: Secondary | ICD-10-CM | POA: Diagnosis not present

## 2019-07-15 DIAGNOSIS — F4323 Adjustment disorder with mixed anxiety and depressed mood: Secondary | ICD-10-CM | POA: Diagnosis not present

## 2019-07-17 DIAGNOSIS — F4323 Adjustment disorder with mixed anxiety and depressed mood: Secondary | ICD-10-CM | POA: Diagnosis not present

## 2019-07-22 DIAGNOSIS — Z713 Dietary counseling and surveillance: Secondary | ICD-10-CM | POA: Diagnosis not present

## 2019-07-22 DIAGNOSIS — F4323 Adjustment disorder with mixed anxiety and depressed mood: Secondary | ICD-10-CM | POA: Diagnosis not present

## 2019-07-24 DIAGNOSIS — F4323 Adjustment disorder with mixed anxiety and depressed mood: Secondary | ICD-10-CM | POA: Diagnosis not present

## 2019-07-26 ENCOUNTER — Other Ambulatory Visit: Payer: Self-pay | Admitting: Family Medicine

## 2019-07-29 DIAGNOSIS — F4323 Adjustment disorder with mixed anxiety and depressed mood: Secondary | ICD-10-CM | POA: Diagnosis not present

## 2019-07-29 DIAGNOSIS — Z713 Dietary counseling and surveillance: Secondary | ICD-10-CM | POA: Diagnosis not present

## 2019-07-30 DIAGNOSIS — F4323 Adjustment disorder with mixed anxiety and depressed mood: Secondary | ICD-10-CM | POA: Diagnosis not present

## 2019-08-05 DIAGNOSIS — Z713 Dietary counseling and surveillance: Secondary | ICD-10-CM | POA: Diagnosis not present

## 2019-08-06 DIAGNOSIS — Z20828 Contact with and (suspected) exposure to other viral communicable diseases: Secondary | ICD-10-CM | POA: Diagnosis not present

## 2019-08-08 DIAGNOSIS — D849 Immunodeficiency, unspecified: Secondary | ICD-10-CM | POA: Diagnosis not present

## 2019-08-08 DIAGNOSIS — Z23 Encounter for immunization: Secondary | ICD-10-CM | POA: Diagnosis not present

## 2019-08-08 DIAGNOSIS — H0510 Unspecified chronic inflammatory disorders of orbit: Secondary | ICD-10-CM | POA: Diagnosis not present

## 2019-08-08 DIAGNOSIS — Z7952 Long term (current) use of systemic steroids: Secondary | ICD-10-CM | POA: Diagnosis not present

## 2019-08-08 DIAGNOSIS — Z79899 Other long term (current) drug therapy: Secondary | ICD-10-CM | POA: Diagnosis not present

## 2019-08-11 ENCOUNTER — Other Ambulatory Visit: Payer: Self-pay

## 2019-08-11 ENCOUNTER — Encounter (INDEPENDENT_AMBULATORY_CARE_PROVIDER_SITE_OTHER): Payer: Self-pay | Admitting: Otolaryngology

## 2019-08-11 ENCOUNTER — Ambulatory Visit (INDEPENDENT_AMBULATORY_CARE_PROVIDER_SITE_OTHER): Payer: BC Managed Care – PPO | Admitting: Otolaryngology

## 2019-08-11 VITALS — Temp 97.3°F

## 2019-08-11 DIAGNOSIS — H6123 Impacted cerumen, bilateral: Secondary | ICD-10-CM | POA: Diagnosis not present

## 2019-08-11 DIAGNOSIS — H903 Sensorineural hearing loss, bilateral: Secondary | ICD-10-CM | POA: Diagnosis not present

## 2019-08-11 NOTE — Progress Notes (Signed)
HPI: Yvette White is a 49 y.o. female who presents for evaluation of hearing.  She describes a sensation of hearing loss worse in the right ear that she has had for years.  She can hear clearly when she is listening to be less than the volume is.  She also describes a fullness feeling in the right ear.  She has noticed a progressive hearing loss predominately the right ear over the past 2 years.  She has had no drainage from the ear.  Past Medical History:  Diagnosis Date  . ADD (attention deficit disorder), controlled with Adderall   . Anemia   . Anxiety   . Arthritis   . Chicken pox   . Depression   . Disorder of thyroid   . GERD (gastroesophageal reflux disease)   . Heart murmur   . Hyperthyroidism   . Menstrual disorder    uterine fibriods, polyps,cysts  . Migraine   . Morbid obesity ([place])   . Neurocardiogenic syncope   . OSA on CPAP   . Pre-diabetes   . Swallowing difficulty   . Vitamin D deficiency    Past Surgical History:  Procedure Laterality Date  . DILATATION & CURETTAGE/HYSTEROSCOPY WITH MYOSURE N/A 06/08/2016   Procedure: DILATATION & CURETTAGE/HYSTEROSCOPY;  Surgeon: Louretta Shorten, MD;  Location: [facility];  Service: Gynecology;  Laterality: N/A;  . LAPAROSCOPIC GASTRIC SLEEVE RESECTION N/A 02/20/2017   Procedure: LAPAROSCOPIC GASTRIC SLEEVE RESECTION WITH UPPER ENDOSCOPY;  Surgeon: Johnathan Hausen, MD;  Location: [facility];  Service: General;  Laterality: N/A;  . TILT TABLE STUDY    . URETHRAL DILATION  1979  . WISDOM TOOTH EXTRACTION     Social History   Socioeconomic History  . Marital status: Single    Spouse name: Not on file  . Number of children: 0  . Years of education: BA  . Highest education level: Not on file  Occupational History  . Occupation: Freight forwarder, Geophysicist/field seismologist, Instructor  Tobacco Use  . Smoking status: Former Smoker    Packs/day: 0.25    Years: 20.00    Pack years: 5.00    Types: Cigarettes    Start date: 73    Quit date: 09/30/2014   Years since quitting: 4.8  . Smokeless tobacco: Never Used  Substance and Sexual Activity  . Alcohol use: Yes    Comment: occ  . Drug use: No  . Sexual activity: Not on file  Other Topics Concern  . Not on file  Social History Narrative   Drinks 1-2 caffeine drinks a day. 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    Social Determinants of Health   Financial Resource Strain:   . Difficulty of Paying Living Expenses:   Food Insecurity:   . Worried About Charity fundraiser in the Last Year:   . Arboriculturist in the Last Year:   Transportation Needs:   . Film/video editor (Medical):   Marland Kitchen Lack of Transportation (Non-Medical):   Physical Activity:   . Days of Exercise per Week:   . Minutes of Exercise per Session:   Stress:   . Feeling of Stress :   Social Connections:   . Frequency of Communication with Friends and Family:   . Frequency of Social Gatherings with Friends and Family:   . Attends Religious Services:   . Active Member of Clubs or Organizations:   . Attends Archivist Meetings:   Marland Kitchen Marital Status:  Family History  Problem Relation Age of Onset  . Diabetes Mother   . Heart disease Mother   . Uterine cancer Mother   . Hypertension Mother   . Hyperlipidemia Mother   . Kidney disease Mother   . Thyroid disease Mother   . Cancer Mother   . Depression Mother   . Sleep apnea Mother   . Obesity Mother   . Heart attack Father 80  . Diabetes Father   . Heart disease Father   . Hypertension Father   . Hyperlipidemia Father   . Obesity Father   . Atrial fibrillation Maternal Grandmother   . Parkinson's disease Maternal Grandmother   . Colon cancer Maternal Grandfather 9  . Heart disease Paternal Grandmother   . Heart disease Paternal Grandfather    Allergies  Allergen Reactions  . Theophylline     Other reaction(s): Other (See Comments) Hyperactive  "Slo phylinne"  . Penicillins Rash    Has patient  had a PCN reaction causing immediate rash, facial/tongue/throat swelling, SOB or lightheadedness with hypotension:unsure Has patient had a PCN reaction causing severe rash involving mucus membranes or skin necrosis:unsure Has patient had a PCN reaction that required hospitalization:No Has patient had a PCN reaction occurring within the last 10 years: No If all of the above answers are "NO", then may proceed with Cephalosporin use.   . Pseudoephedrine Rash  . Sulfamethoxazole-Trimethoprim Rash   Prior to Admission medications   Medication Sig Start Date End Date Taking? Authorizing Provider  acetaminophen (TYLENOL) 500 MG tablet Take 1,000 mg by mouth every 6 (six) hours as needed (for pain.).    [provider]  Adalimumab 40 MG/0.4ML PSKT Inject into the skin. 07/15/18 07/15/19  [provider]  Azelastine-Fluticasone 137-50 MCG/ACT SUSP Place 1 spray into the nose daily. 05/10/18   Orma Flaming, MD  B-D TB SYRINGE 1CC/27GX1/2" 27G X 1/2" 1 ML MISC USE AS DRECTED FOR UP TO 30 DAYS 02/13/19   [provider]  buPROPion (WELLBUTRIN XL) 300 MG 24 hr tablet TAKE 1 TABLET(300 MG) BY MOUTH DAILY 01/07/19   Briscoe Deutscher, DO  Calcium Carbonate Antacid (ANTACID PO) Take 500 mg by mouth 3 (three) times daily.     [provider]  clonazePAM Bobbye [place]) 0.5 MG tablet Take by mouth. 01/19/17   [provider]  folic acid (FOLVITE) 1 MG tablet Take 1 mg by mouth daily.    [provider]  lamoTRIgine (LAMICTAL) 200 MG tablet Take 200 mg by mouth every evening.     [provider]  lisdexamfetamine (VYVANSE) 70 MG capsule Take 70 mg by mouth daily.    [provider]  loratadine (CLARITIN) 10 MG tablet Take 10 mg by mouth every evening.    [provider]  Meth-Hyo-M Bl-Na Phos-Ph Sal (URIBEL) 118 MG CAPS One po QID prn bladder spasm 12/24/17   Briscoe Deutscher, DO  Methotrexate Sodium (METHOTREXATE, PF,) 50 MG/2ML injection once a  week. 01/05/18   [provider]  Multiple Vitamins-Minerals (CELEBRATE MULTI-COMPLETE 18 PO) Take by mouth.    [provider]  ondansetron (ZOFRAN) 4 MG tablet Take 1 tablet (4 mg total) by mouth every 8 (eight) hours as needed for nausea or vomiting. 06/26/18   Orma Flaming, MD  pantoprazole (PROTONIX) 40 MG tablet Take 1 tablet (40 mg total) by mouth daily. 12/24/18   Briscoe Deutscher, DO  predniSONE (DELTASONE) 20 MG tablet  06/09/18   [provider]     Positive  ROS: Otherwise negative  All other systems have been reviewed and were otherwise negative with the exception of those mentioned in the HPI and as above.  Physical Exam: Constitutional: Alert, well-appearing, no acute distress Ears: External ears without lesions or tenderness.  She has a mild amount of wax buildup in both ear canals that was cleaned with a curette.  Both TMs were clear with good mobility pneumatic otoscopy. Nasal: External nose without lesions. Septum midline. Clear nasal passages.  No signs of infection. Oral: Lips and gums without lesions. Tongue and palate mucosa without lesions. Posterior oropharynx clear. Neck: No palpable adenopathy or masses Respiratory: Breathing comfortably  Skin: No facial/neck lesions or rash noted.  Audiogram demonstrated essentially normal hearing in both ears with a slight downsloping sensorineural hearing loss above 6000 frequency.  SRT's were 25 dB on the right side and 20 dB on the left side.  She had type A tympanograms bilaterally.  Cerumen impaction removal  Date/Time: 08/11/2019 3:40 PM Performed by: Rozetta Nunnery, MD Authorized by: Rozetta Nunnery, MD   Consent:    Consent obtained:  Verbal   Consent given by:  Patient   Risks discussed:  Pain and bleeding Procedure details:    Location:  L ear and R ear   Procedure type: curette   Post-procedure details:    Inspection:  TM intact and canal normal   Hearing quality:   Improved   Patient tolerance of procedure:  Tolerated well, no immediate complications Comments:     TMs were clear bilaterally.    Assessment: Essentially normal hearing evaluation.  Questionable etiology of perceived hearing loss. Cerumen buildup.  Plan: Ears were cleaned in the office. Hearing evaluation in the office today demonstrated essentially normal hearing in both ears.  Radene Journey, MD

## 2019-08-12 ENCOUNTER — Encounter (INDEPENDENT_AMBULATORY_CARE_PROVIDER_SITE_OTHER): Payer: Self-pay

## 2019-08-12 DIAGNOSIS — F900 Attention-deficit hyperactivity disorder, predominantly inattentive type: Secondary | ICD-10-CM | POA: Diagnosis not present

## 2019-08-12 DIAGNOSIS — F4323 Adjustment disorder with mixed anxiety and depressed mood: Secondary | ICD-10-CM | POA: Diagnosis not present

## 2019-08-12 DIAGNOSIS — F3181 Bipolar II disorder: Secondary | ICD-10-CM | POA: Diagnosis not present

## 2019-08-12 DIAGNOSIS — Z713 Dietary counseling and surveillance: Secondary | ICD-10-CM | POA: Diagnosis not present

## 2019-08-13 DIAGNOSIS — F4323 Adjustment disorder with mixed anxiety and depressed mood: Secondary | ICD-10-CM | POA: Diagnosis not present

## 2019-08-14 DIAGNOSIS — F4323 Adjustment disorder with mixed anxiety and depressed mood: Secondary | ICD-10-CM | POA: Diagnosis not present

## 2019-08-19 DIAGNOSIS — F4323 Adjustment disorder with mixed anxiety and depressed mood: Secondary | ICD-10-CM | POA: Diagnosis not present

## 2019-08-19 DIAGNOSIS — Z713 Dietary counseling and surveillance: Secondary | ICD-10-CM | POA: Diagnosis not present

## 2019-08-20 DIAGNOSIS — F4323 Adjustment disorder with mixed anxiety and depressed mood: Secondary | ICD-10-CM | POA: Diagnosis not present

## 2019-08-21 DIAGNOSIS — L821 Other seborrheic keratosis: Secondary | ICD-10-CM | POA: Diagnosis not present

## 2019-08-21 DIAGNOSIS — D2262 Melanocytic nevi of left upper limb, including shoulder: Secondary | ICD-10-CM | POA: Diagnosis not present

## 2019-08-21 DIAGNOSIS — D2271 Melanocytic nevi of right lower limb, including hip: Secondary | ICD-10-CM | POA: Diagnosis not present

## 2019-08-21 DIAGNOSIS — D225 Melanocytic nevi of trunk: Secondary | ICD-10-CM | POA: Diagnosis not present

## 2019-08-25 DIAGNOSIS — F4323 Adjustment disorder with mixed anxiety and depressed mood: Secondary | ICD-10-CM | POA: Diagnosis not present

## 2019-08-26 DIAGNOSIS — F4323 Adjustment disorder with mixed anxiety and depressed mood: Secondary | ICD-10-CM | POA: Diagnosis not present

## 2019-08-27 DIAGNOSIS — Z20828 Contact with and (suspected) exposure to other viral communicable diseases: Secondary | ICD-10-CM | POA: Diagnosis not present

## 2019-09-02 ENCOUNTER — Encounter (HOSPITAL_COMMUNITY): Payer: Self-pay

## 2019-09-02 DIAGNOSIS — Z713 Dietary counseling and surveillance: Secondary | ICD-10-CM | POA: Diagnosis not present

## 2019-09-09 DIAGNOSIS — Z713 Dietary counseling and surveillance: Secondary | ICD-10-CM | POA: Diagnosis not present

## 2019-09-09 DIAGNOSIS — F4323 Adjustment disorder with mixed anxiety and depressed mood: Secondary | ICD-10-CM | POA: Diagnosis not present

## 2019-09-10 DIAGNOSIS — F4323 Adjustment disorder with mixed anxiety and depressed mood: Secondary | ICD-10-CM | POA: Diagnosis not present

## 2019-09-16 DIAGNOSIS — Z713 Dietary counseling and surveillance: Secondary | ICD-10-CM | POA: Diagnosis not present

## 2019-09-19 DIAGNOSIS — Z03818 Encounter for observation for suspected exposure to other biological agents ruled out: Secondary | ICD-10-CM | POA: Diagnosis not present

## 2019-09-19 DIAGNOSIS — Z20828 Contact with and (suspected) exposure to other viral communicable diseases: Secondary | ICD-10-CM | POA: Diagnosis not present

## 2019-09-23 DIAGNOSIS — F4323 Adjustment disorder with mixed anxiety and depressed mood: Secondary | ICD-10-CM | POA: Diagnosis not present

## 2019-09-24 DIAGNOSIS — Z713 Dietary counseling and surveillance: Secondary | ICD-10-CM | POA: Diagnosis not present

## 2019-09-30 DIAGNOSIS — F4323 Adjustment disorder with mixed anxiety and depressed mood: Secondary | ICD-10-CM | POA: Diagnosis not present

## 2019-10-01 DIAGNOSIS — F4323 Adjustment disorder with mixed anxiety and depressed mood: Secondary | ICD-10-CM | POA: Diagnosis not present

## 2019-10-07 DIAGNOSIS — F4323 Adjustment disorder with mixed anxiety and depressed mood: Secondary | ICD-10-CM | POA: Diagnosis not present

## 2019-10-07 DIAGNOSIS — Z713 Dietary counseling and surveillance: Secondary | ICD-10-CM | POA: Diagnosis not present

## 2019-10-08 DIAGNOSIS — F4323 Adjustment disorder with mixed anxiety and depressed mood: Secondary | ICD-10-CM | POA: Diagnosis not present

## 2019-10-13 ENCOUNTER — Ambulatory Visit (INDEPENDENT_AMBULATORY_CARE_PROVIDER_SITE_OTHER): Payer: BC Managed Care – PPO | Admitting: Family Medicine

## 2019-10-13 ENCOUNTER — Other Ambulatory Visit: Payer: Self-pay

## 2019-10-13 ENCOUNTER — Encounter: Payer: Self-pay | Admitting: Family Medicine

## 2019-10-13 VITALS — BP 100/66 | HR 58 | Temp 97.3°F | Ht 65.0 in | Wt 319.4 lb

## 2019-10-13 DIAGNOSIS — E079 Disorder of thyroid, unspecified: Secondary | ICD-10-CM | POA: Diagnosis not present

## 2019-10-13 DIAGNOSIS — R7303 Prediabetes: Secondary | ICD-10-CM | POA: Diagnosis not present

## 2019-10-13 DIAGNOSIS — E559 Vitamin D deficiency, unspecified: Secondary | ICD-10-CM | POA: Diagnosis not present

## 2019-10-13 LAB — HEMOGLOBIN A1C: Hgb A1c MFr Bld: 5.5 % (ref 4.6–6.5)

## 2019-10-13 NOTE — Progress Notes (Signed)
Patient: Yvette White MRN: [id number] DOB: 12/29/1970 PCP: Orma Flaming, MD     Subjective:  Chief Complaint  Patient presents with  . Hypothyroidism    Low TSH  . Vitamin d deficiency  . Prediabetes    HPI: The patient is a 49 y.o. female who presents today for 6 month follow up for prediabetes, hx of abnormal TSH and Vitamin D deficiency. She has no complaints today.  Prediabetes Last a1c was 5.7. 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 She is down to 11mg /day right now of her prednisone. She is up 12 pounds since I saw her 6 months ago. She is doing boxing once/week. She also does some strength training. Diet is not necessarily low sugar, but she is followed by a dietician.   History of low TSH In 2019, labs have been normal since that time. Will check again today then just monitor q year.   Vitamin D deficiency She can not recall how much she is on. She thinks it may be 5000IU/day.   She has had her covid vaccines   Review of Systems  Constitutional: Negative for chills, fatigue and fever.  HENT: Negative for dental problem, ear pain, hearing loss and trouble swallowing.   Eyes: Negative for visual disturbance.  Respiratory: Negative for cough, chest tightness, shortness of breath and wheezing.   Cardiovascular: Negative for chest pain, palpitations and leg swelling.  Gastrointestinal: Negative for abdominal pain, blood in stool, diarrhea and nausea.  Endocrine: Negative for cold intolerance, polydipsia, polyphagia and polyuria.  Genitourinary: Negative for dysuria and hematuria.  Musculoskeletal: Negative for arthralgias.  Skin: Negative for rash.  Neurological: Negative for dizziness, light-headedness and headaches.  Psychiatric/Behavioral: Negative for dysphoric mood and sleep disturbance. The patient is not nervous/anxious.     Allergies Patient is allergic to theophylline; penicillins; pseudoephedrine; and sulfamethoxazole-trimethoprim.  Past Medical  History Patient  has a past medical history of ADD (attention deficit disorder), controlled with Adderall, Anemia, Anxiety, Arthritis, Chicken pox, Depression, Disorder of thyroid, GERD (gastroesophageal reflux disease), Heart murmur, Hyperthyroidism, Menstrual disorder, Migraine, Morbid obesity ([place]), Neurocardiogenic syncope, OSA on CPAP, Pre-diabetes, Swallowing difficulty, and Vitamin D deficiency.  Surgical History Patient  has a past surgical history that includes Urethral dilation (1979); Tilt table study; Wisdom tooth extraction; Dilatation & curettage/hysteroscopy with myosure (N/A, 06/08/2016); and Laparoscopic gastric sleeve resection (N/A, 02/20/2017).  Family History Pateint's family history includes Atrial fibrillation in her maternal grandmother; Cancer in her mother; Colon cancer (age of onset: 54) in her maternal grandfather; Depression in her mother; Diabetes in her father and mother; Heart attack (age of onset: 44) in her father; Heart disease in her father, mother, paternal grandfather, and paternal grandmother; Hyperlipidemia in her father and mother; Hypertension in her father and mother; Kidney disease in her mother; Obesity in her father and mother; Parkinson's disease in her maternal grandmother; Sleep apnea in her mother; Thyroid disease in her mother; Uterine cancer in her mother.  Social History Patient  reports that she quit smoking about 5 years ago. Her smoking use included cigarettes. She started smoking about 27 years ago. She has a 5.00 pack-year smoking history. She has never used smokeless tobacco. She reports current alcohol use. She reports that she does not use drugs.    Objective: Vitals:   10/13/19 1344  BP: 100/66  Pulse: (!) 58  Temp: (!) 97.3 F (36.3 C)  TempSrc: Temporal  SpO2: 98%  Weight: (!) 319 lb 6.4 oz (144.9 kg)  Height: 5\' 5"  (1.651 m)  Body mass index is 53.15 kg/m.  Physical Exam Vitals reviewed.  HENT:     Head: Normocephalic and  atraumatic.     Right Ear: Tympanic membrane, ear canal and external ear normal.     Left Ear: Tympanic membrane, ear canal and external ear normal.  Cardiovascular:     Rate and Rhythm: Normal rate and regular rhythm.     Heart sounds: Normal heart sounds.  Pulmonary:     Effort: Pulmonary effort is normal.     Breath sounds: Normal breath sounds.  Abdominal:     General: Bowel sounds are normal.     Palpations: Abdomen is soft.  Skin:    General: Skin is warm.  Neurological:     General: No focal deficit present.     Mental Status: She is oriented to person, place, and time.  Psychiatric:        Mood and Affect: Mood normal.        Behavior: Behavior normal.      Office Visit from 10/13/2019 in [facility]  PHQ-2 Total Score  2          Assessment/plan: 1. Thyroid condition, history of low TSH that normalized with monitoring If this TSH is normal, discussed we can transition to normal yearly checks as they have been normal for past 2 year.s  - TSH  2. Pre-diabetes Routine labs today. She has gained 12 pounds in last 6 months. Has nutritionist, knows what she needs to eat and knows she needs to start exercising. Is slowly weaning down off prednisone. F/u in 6 months.  - Hemoglobin A1c  3. Vitamin D deficiency Currently on 5000IU/day. Recheck today, as she was borderline normal on last check.  - VITAMIN D 25 Hydroxy (Vit-D Deficiency, Fractures)    This visit occurred during the SARS-CoV-2 public health emergency.  Safety protocols were in place, including screening questions prior to the visit, additional usage of staff PPE, and extensive cleaning of exam room while observing appropriate contact time as indicated for disinfecting solutions.     Return in about 6 months (around 04/14/2020) for routine follow up (fasting please!) .   Orma Flaming, MD [place]   10/13/2019

## 2019-10-13 NOTE — Patient Instructions (Signed)
You are doing great. Let's see you back in 6 months time!

## 2019-10-14 DIAGNOSIS — Z713 Dietary counseling and surveillance: Secondary | ICD-10-CM | POA: Diagnosis not present

## 2019-10-14 LAB — TSH: TSH: 2.14 u[IU]/mL (ref 0.35–4.50)

## 2019-10-14 LAB — VITAMIN D 25 HYDROXY (VIT D DEFICIENCY, FRACTURES): VITD: 54.04 ng/mL (ref 30.00–100.00)

## 2019-10-21 DIAGNOSIS — Z713 Dietary counseling and surveillance: Secondary | ICD-10-CM | POA: Diagnosis not present

## 2019-10-28 DIAGNOSIS — F4323 Adjustment disorder with mixed anxiety and depressed mood: Secondary | ICD-10-CM | POA: Diagnosis not present

## 2019-11-04 DIAGNOSIS — Z713 Dietary counseling and surveillance: Secondary | ICD-10-CM | POA: Diagnosis not present

## 2019-11-04 DIAGNOSIS — F4323 Adjustment disorder with mixed anxiety and depressed mood: Secondary | ICD-10-CM | POA: Diagnosis not present

## 2019-11-06 ENCOUNTER — Encounter: Payer: Self-pay | Admitting: Family Medicine

## 2019-11-06 DIAGNOSIS — F4323 Adjustment disorder with mixed anxiety and depressed mood: Secondary | ICD-10-CM | POA: Diagnosis not present

## 2019-11-06 NOTE — Telephone Encounter (Signed)
LVM for patient to call and get scheduled for an appointment,

## 2019-11-06 NOTE — Telephone Encounter (Signed)
Please schedule (in person) visit with patient.  Thank You

## 2019-11-07 ENCOUNTER — Ambulatory Visit (INDEPENDENT_AMBULATORY_CARE_PROVIDER_SITE_OTHER): Payer: BC Managed Care – PPO | Admitting: Family Medicine

## 2019-11-07 ENCOUNTER — Encounter: Payer: Self-pay | Admitting: Family Medicine

## 2019-11-07 ENCOUNTER — Other Ambulatory Visit: Payer: Self-pay

## 2019-11-07 VITALS — BP 122/62 | HR 71 | Temp 98.0°F | Ht 65.0 in | Wt 321.4 lb

## 2019-11-07 DIAGNOSIS — R42 Dizziness and giddiness: Secondary | ICD-10-CM

## 2019-11-07 MED ORDER — CLONAZEPAM 0.5 MG PO TABS: 0.5000 mg | ORAL_TABLET | Freq: Two times a day (BID) | ORAL | 0 refills | Status: DC | PRN

## 2019-11-07 MED ORDER — MECLIZINE HCL 25 MG PO TABS: 25.0000 mg | ORAL_TABLET | Freq: Three times a day (TID) | ORAL | 0 refills | Status: DC | PRN

## 2019-11-07 NOTE — Patient Instructions (Signed)
1) ill get in touch with Dr. Flonnie Overman  2) I want to order brain MRI, i'll be in touch.  3) sent in meclizine for you to try as needed for dizziness, but I doubt this will be really helpful since more for vertigo. Can also try klonopin...   Sent in new px for this.

## 2019-11-07 NOTE — Progress Notes (Signed)
Patient: Yvette White MRN: [id number] DOB: 1970/09/18 PCP: Orma Flaming, MD     Subjective:  Chief Complaint  Patient presents with  . Dizziness    HPI: The patient is a 49 y.o. female who presents today for dizziness. No complaints of nausea and vomiting. Nothing makes it better or worse. It comes and goes lasting as long as an hour. Starting 1 week and a half ago.  She states she has been having dizzy spells for quite some time. She thinks it started to be noticeable 3-4 months ago. She always has dizzy spells, but they have increased the past few months and then even more so the past 10 -14 days. The spells have been such that she thought she could pass out. She feels like the world is spinning and she is standing still. Episodes usually last up to an hour. She will have episodes when she is sitting or standing, not positional changes or with head movement. Nothing seems to make it worse or better. She will just wait it out. She can't really correlate it with anything. She has not started any new medication. She has no associated symptoms and denies chest pain/palpitations, tinnitus, shortness of breath.   She does have hx of neurocardiogenic syncope. No episodes since 2017.   Last brain MRI was 2018.    Review of Systems  Respiratory: Negative for cough, shortness of breath and wheezing.   Cardiovascular: Negative for chest pain and palpitations.  Gastrointestinal: Negative for nausea and vomiting.  Neurological: Positive for dizziness. Negative for light-headedness and headaches.    Allergies Patient is allergic to theophylline, penicillins, pseudoephedrine, and sulfamethoxazole-trimethoprim.  Past Medical History Patient  has a past medical history of ADD (attention deficit disorder), controlled with Adderall, Anemia, Anxiety, Arthritis, Chicken pox, Depression, Disorder of thyroid, GERD (gastroesophageal reflux disease), Heart murmur, Hyperthyroidism, Menstrual disorder,  Migraine, Morbid obesity ([place]), Neurocardiogenic syncope, OSA on CPAP, Pre-diabetes, Swallowing difficulty, and Vitamin D deficiency.  Surgical History Patient  has a past surgical history that includes Urethral dilation (1979); Tilt table study; Wisdom tooth extraction; Dilatation & curettage/hysteroscopy with myosure (N/A, 06/08/2016); and Laparoscopic gastric sleeve resection (N/A, 02/20/2017).  Family History Pateint's family history includes Atrial fibrillation in her maternal grandmother; Cancer in her mother; Colon cancer (age of onset: 50) in her maternal grandfather; Depression in her mother; Diabetes in her father and mother; Heart attack (age of onset: 37) in her father; Heart disease in her father, mother, paternal grandfather, and paternal grandmother; Hyperlipidemia in her father and mother; Hypertension in her father and mother; Kidney disease in her mother; Obesity in her father and mother; Parkinson's disease in her maternal grandmother; Sleep apnea in her mother; Thyroid disease in her mother; Uterine cancer in her mother.  Social History Patient  reports that she quit smoking about 5 years ago. Her smoking use included cigarettes. She started smoking about 27 years ago. She has a 5.00 pack-year smoking history. She has never used smokeless tobacco. She reports current alcohol use. She reports that she does not use drugs.    Objective: Vitals:   11/07/19 1259 11/07/19 1400 11/07/19 1401 11/07/19 1402  BP: 122/62     Pulse: 71     Temp: 98 F (36.7 C)     TempSrc: Temporal     SpO2: 98% 97% 98% 97%  Weight: (!) 321 lb 6.4 oz (145.8 kg)     Height: 5\' 5"  (1.651 m)       Body mass index is  53.48 kg/m.  Physical Exam Vitals reviewed.  Constitutional:      Appearance: Normal appearance. She is obese.  HENT:     Head: Normocephalic and atraumatic.     Right Ear: Tympanic membrane, ear canal and external ear normal.     Left Ear: Tympanic membrane, ear canal and external  ear normal.     Nose: Nose normal.  Eyes:     Extraocular Movements: Extraocular movements intact.     Conjunctiva/sclera: Conjunctivae normal.     Pupils: Pupils are equal, round, and reactive to light.  Cardiovascular:     Rate and Rhythm: Normal rate and regular rhythm.     Heart sounds: Normal heart sounds.  Pulmonary:     Effort: Pulmonary effort is normal.     Breath sounds: Normal breath sounds.  Abdominal:     General: Bowel sounds are normal.     Palpations: Abdomen is soft.  Musculoskeletal:     Cervical back: Normal range of motion and neck supple.  Neurological:     General: No focal deficit present.     Mental Status: She is alert and oriented to person, place, and time.     Cranial Nerves: No cranial nerve deficit.     Motor: No weakness.     Coordination: Coordination normal.     Deep Tendon Reflexes: Reflexes normal.     Comments: Negative dix halpike bilaterally   Psychiatric:        Mood and Affect: Mood normal.        Behavior: Behavior normal.    orthostatics wnl     Assessment/plan: 1. Dizziness > 6 month history and exam/history not consistent with vertigo. No abnormal neurological finding. Orthostatics WNL. 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 I would like to get a MRI and will touch base with her rheumatologist who wanted to get one in August and see about moving it up. She is also on chronic steroids which can also cause dizziness, but more vertigo and has been on steroids much longer than 6 months. Trial meclizine to see if gives some relief, if not she can try a klonopin. Ill be in touch after I talk to her specialist.     This visit occurred during the SARS-CoV-2 public health emergency.  Safety protocols were in place, including screening questions prior to the visit, additional usage of staff PPE, and extensive cleaning of exam room while observing appropriate contact time as indicated for  disinfecting solutions.   Total time of encounter: 40 minutes total time of encounter, including 30 minutes spent in face-to-face patient care. This time includes coordination of care and counseling regarding 6 month dizziness. Remainder of non-face-to-face time involved reviewing chart documents/testing relevant to the patient encounter and documentation in the medical record.    Return if symptoms worsen or fail to improve.   Orma Flaming, MD [place]   11/08/2019

## 2019-11-11 DIAGNOSIS — F4323 Adjustment disorder with mixed anxiety and depressed mood: Secondary | ICD-10-CM | POA: Diagnosis not present

## 2019-11-11 DIAGNOSIS — Z713 Dietary counseling and surveillance: Secondary | ICD-10-CM | POA: Diagnosis not present

## 2019-11-12 DIAGNOSIS — H0510 Unspecified chronic inflammatory disorders of orbit: Secondary | ICD-10-CM | POA: Diagnosis not present

## 2019-11-12 DIAGNOSIS — Z7952 Long term (current) use of systemic steroids: Secondary | ICD-10-CM | POA: Diagnosis not present

## 2019-11-12 DIAGNOSIS — R42 Dizziness and giddiness: Secondary | ICD-10-CM | POA: Diagnosis not present

## 2019-11-12 DIAGNOSIS — Z79899 Other long term (current) drug therapy: Secondary | ICD-10-CM | POA: Diagnosis not present

## 2019-11-13 ENCOUNTER — Encounter: Payer: Self-pay | Admitting: Family Medicine

## 2019-11-17 DIAGNOSIS — F4323 Adjustment disorder with mixed anxiety and depressed mood: Secondary | ICD-10-CM | POA: Diagnosis not present

## 2019-11-18 DIAGNOSIS — F4323 Adjustment disorder with mixed anxiety and depressed mood: Secondary | ICD-10-CM | POA: Diagnosis not present

## 2019-11-18 DIAGNOSIS — Z713 Dietary counseling and surveillance: Secondary | ICD-10-CM | POA: Diagnosis not present

## 2019-11-24 DIAGNOSIS — F4323 Adjustment disorder with mixed anxiety and depressed mood: Secondary | ICD-10-CM | POA: Diagnosis not present

## 2019-11-25 DIAGNOSIS — Z713 Dietary counseling and surveillance: Secondary | ICD-10-CM | POA: Diagnosis not present

## 2019-12-02 DIAGNOSIS — Z713 Dietary counseling and surveillance: Secondary | ICD-10-CM | POA: Diagnosis not present

## 2019-12-03 DIAGNOSIS — F4323 Adjustment disorder with mixed anxiety and depressed mood: Secondary | ICD-10-CM | POA: Diagnosis not present

## 2019-12-09 DIAGNOSIS — F4323 Adjustment disorder with mixed anxiety and depressed mood: Secondary | ICD-10-CM | POA: Diagnosis not present

## 2019-12-12 DIAGNOSIS — H0510 Unspecified chronic inflammatory disorders of orbit: Secondary | ICD-10-CM | POA: Diagnosis not present

## 2019-12-16 DIAGNOSIS — F4323 Adjustment disorder with mixed anxiety and depressed mood: Secondary | ICD-10-CM | POA: Diagnosis not present

## 2019-12-16 DIAGNOSIS — Z713 Dietary counseling and surveillance: Secondary | ICD-10-CM | POA: Diagnosis not present

## 2019-12-19 ENCOUNTER — Other Ambulatory Visit: Payer: Self-pay | Admitting: Family Medicine

## 2019-12-19 DIAGNOSIS — K219 Gastro-esophageal reflux disease without esophagitis: Secondary | ICD-10-CM

## 2019-12-22 NOTE — Telephone Encounter (Signed)
Please review

## 2019-12-23 DIAGNOSIS — F4323 Adjustment disorder with mixed anxiety and depressed mood: Secondary | ICD-10-CM | POA: Diagnosis not present

## 2019-12-23 DIAGNOSIS — Z713 Dietary counseling and surveillance: Secondary | ICD-10-CM | POA: Diagnosis not present

## 2019-12-24 DIAGNOSIS — F4323 Adjustment disorder with mixed anxiety and depressed mood: Secondary | ICD-10-CM | POA: Diagnosis not present

## 2019-12-25 DIAGNOSIS — H02834 Dermatochalasis of left upper eyelid: Secondary | ICD-10-CM | POA: Diagnosis not present

## 2019-12-25 DIAGNOSIS — H02831 Dermatochalasis of right upper eyelid: Secondary | ICD-10-CM | POA: Diagnosis not present

## 2019-12-25 DIAGNOSIS — H0510 Unspecified chronic inflammatory disorders of orbit: Secondary | ICD-10-CM | POA: Diagnosis not present

## 2019-12-30 DIAGNOSIS — Z713 Dietary counseling and surveillance: Secondary | ICD-10-CM | POA: Diagnosis not present

## 2019-12-30 DIAGNOSIS — F4323 Adjustment disorder with mixed anxiety and depressed mood: Secondary | ICD-10-CM | POA: Diagnosis not present

## 2020-01-08 DIAGNOSIS — Z713 Dietary counseling and surveillance: Secondary | ICD-10-CM | POA: Diagnosis not present

## 2020-01-13 DIAGNOSIS — F4323 Adjustment disorder with mixed anxiety and depressed mood: Secondary | ICD-10-CM | POA: Diagnosis not present

## 2020-01-18 DIAGNOSIS — Z20822 Contact with and (suspected) exposure to covid-19: Secondary | ICD-10-CM | POA: Diagnosis not present

## 2020-01-20 DIAGNOSIS — Z713 Dietary counseling and surveillance: Secondary | ICD-10-CM | POA: Diagnosis not present

## 2020-01-20 DIAGNOSIS — F4323 Adjustment disorder with mixed anxiety and depressed mood: Secondary | ICD-10-CM | POA: Diagnosis not present

## 2020-01-21 DIAGNOSIS — F4323 Adjustment disorder with mixed anxiety and depressed mood: Secondary | ICD-10-CM | POA: Diagnosis not present

## 2020-01-27 DIAGNOSIS — F4323 Adjustment disorder with mixed anxiety and depressed mood: Secondary | ICD-10-CM | POA: Diagnosis not present

## 2020-01-27 DIAGNOSIS — Z713 Dietary counseling and surveillance: Secondary | ICD-10-CM | POA: Diagnosis not present

## 2020-01-28 DIAGNOSIS — F900 Attention-deficit hyperactivity disorder, predominantly inattentive type: Secondary | ICD-10-CM | POA: Diagnosis not present

## 2020-01-28 DIAGNOSIS — F4322 Adjustment disorder with anxiety: Secondary | ICD-10-CM | POA: Diagnosis not present

## 2020-01-28 DIAGNOSIS — F3189 Other bipolar disorder: Secondary | ICD-10-CM | POA: Diagnosis not present

## 2020-01-30 DIAGNOSIS — F4323 Adjustment disorder with mixed anxiety and depressed mood: Secondary | ICD-10-CM | POA: Diagnosis not present

## 2020-02-03 DIAGNOSIS — Z713 Dietary counseling and surveillance: Secondary | ICD-10-CM | POA: Diagnosis not present

## 2020-02-03 DIAGNOSIS — F4323 Adjustment disorder with mixed anxiety and depressed mood: Secondary | ICD-10-CM | POA: Diagnosis not present

## 2020-02-09 DIAGNOSIS — Z03818 Encounter for observation for suspected exposure to other biological agents ruled out: Secondary | ICD-10-CM | POA: Diagnosis not present

## 2020-02-10 DIAGNOSIS — Z6841 Body Mass Index (BMI) 40.0 and over, adult: Secondary | ICD-10-CM | POA: Diagnosis not present

## 2020-02-10 DIAGNOSIS — Z01419 Encounter for gynecological examination (general) (routine) without abnormal findings: Secondary | ICD-10-CM | POA: Diagnosis not present

## 2020-02-10 DIAGNOSIS — Z1231 Encounter for screening mammogram for malignant neoplasm of breast: Secondary | ICD-10-CM | POA: Diagnosis not present

## 2020-02-10 DIAGNOSIS — F4323 Adjustment disorder with mixed anxiety and depressed mood: Secondary | ICD-10-CM | POA: Diagnosis not present

## 2020-02-10 DIAGNOSIS — Z713 Dietary counseling and surveillance: Secondary | ICD-10-CM | POA: Diagnosis not present

## 2020-02-11 DIAGNOSIS — F4323 Adjustment disorder with mixed anxiety and depressed mood: Secondary | ICD-10-CM | POA: Diagnosis not present

## 2020-02-17 DIAGNOSIS — Z713 Dietary counseling and surveillance: Secondary | ICD-10-CM | POA: Diagnosis not present

## 2020-02-20 DIAGNOSIS — H0510 Unspecified chronic inflammatory disorders of orbit: Secondary | ICD-10-CM | POA: Diagnosis not present

## 2020-02-20 DIAGNOSIS — Z79899 Other long term (current) drug therapy: Secondary | ICD-10-CM | POA: Diagnosis not present

## 2020-02-20 DIAGNOSIS — Z20828 Contact with and (suspected) exposure to other viral communicable diseases: Secondary | ICD-10-CM | POA: Diagnosis not present

## 2020-02-24 DIAGNOSIS — F4323 Adjustment disorder with mixed anxiety and depressed mood: Secondary | ICD-10-CM | POA: Diagnosis not present

## 2020-03-01 DIAGNOSIS — Z20822 Contact with and (suspected) exposure to covid-19: Secondary | ICD-10-CM | POA: Diagnosis not present

## 2020-03-02 DIAGNOSIS — Z713 Dietary counseling and surveillance: Secondary | ICD-10-CM | POA: Diagnosis not present

## 2020-03-04 DIAGNOSIS — F4323 Adjustment disorder with mixed anxiety and depressed mood: Secondary | ICD-10-CM | POA: Diagnosis not present

## 2020-03-08 DIAGNOSIS — F4323 Adjustment disorder with mixed anxiety and depressed mood: Secondary | ICD-10-CM | POA: Diagnosis not present

## 2020-03-09 DIAGNOSIS — Z713 Dietary counseling and surveillance: Secondary | ICD-10-CM | POA: Diagnosis not present

## 2020-03-10 DIAGNOSIS — F4323 Adjustment disorder with mixed anxiety and depressed mood: Secondary | ICD-10-CM | POA: Diagnosis not present

## 2020-03-16 DIAGNOSIS — Z713 Dietary counseling and surveillance: Secondary | ICD-10-CM | POA: Diagnosis not present

## 2020-03-23 DIAGNOSIS — F4323 Adjustment disorder with mixed anxiety and depressed mood: Secondary | ICD-10-CM | POA: Diagnosis not present

## 2020-03-24 DIAGNOSIS — Z03818 Encounter for observation for suspected exposure to other biological agents ruled out: Secondary | ICD-10-CM | POA: Diagnosis not present

## 2020-03-30 DIAGNOSIS — F4323 Adjustment disorder with mixed anxiety and depressed mood: Secondary | ICD-10-CM | POA: Diagnosis not present

## 2020-03-30 DIAGNOSIS — Z713 Dietary counseling and surveillance: Secondary | ICD-10-CM | POA: Diagnosis not present

## 2020-03-31 DIAGNOSIS — F4323 Adjustment disorder with mixed anxiety and depressed mood: Secondary | ICD-10-CM | POA: Diagnosis not present

## 2020-04-06 DIAGNOSIS — F4323 Adjustment disorder with mixed anxiety and depressed mood: Secondary | ICD-10-CM | POA: Diagnosis not present

## 2020-04-08 ENCOUNTER — Other Ambulatory Visit: Payer: Self-pay

## 2020-04-08 ENCOUNTER — Encounter: Payer: Self-pay | Admitting: Family Medicine

## 2020-04-08 ENCOUNTER — Ambulatory Visit (INDEPENDENT_AMBULATORY_CARE_PROVIDER_SITE_OTHER): Payer: BC Managed Care – PPO | Admitting: Family Medicine

## 2020-04-08 VITALS — BP 125/60 | HR 62 | Temp 97.9°F | Ht 65.0 in | Wt 307.8 lb

## 2020-04-08 DIAGNOSIS — R109 Unspecified abdominal pain: Secondary | ICD-10-CM

## 2020-04-08 DIAGNOSIS — R1011 Right upper quadrant pain: Secondary | ICD-10-CM | POA: Diagnosis not present

## 2020-04-08 LAB — POCT URINALYSIS DIPSTICK
Blood, UA: NEGATIVE
Glucose, UA: NEGATIVE
Ketones, UA: POSITIVE
Nitrite, UA: NEGATIVE
Protein, UA: POSITIVE — AB
Spec Grav, UA: >=1.03 — AB (ref 1.010–1.025)
Urobilinogen, UA: 1 E.U./dL
pH, UA: 5.5 (ref 5.0–8.0)

## 2020-04-08 MED ORDER — BACLOFEN 10 MG PO TABS: 10.0000 mg | ORAL_TABLET | Freq: Three times a day (TID) | ORAL | 0 refills | Status: DC

## 2020-04-08 MED ORDER — TRAMADOL HCL 50 MG PO TABS
50.0000 mg | ORAL_TABLET | Freq: Three times a day (TID) | ORAL | 0 refills | Status: AC | PRN
Start: 2020-04-08 — End: 2020-04-13

## 2020-04-08 NOTE — Patient Instructions (Signed)
I have ordered xrays for you. At this time we do not have xrays in our clinic. You will have to go to our [facility]. The address is [address]  xray is located in the basement.  Hours of operation are M-F 8:30am to 5:00pm.  Closed for lunch between 12:30 and 1:00pm.

## 2020-04-08 NOTE — Progress Notes (Signed)
Patient: Yvette White MRN: [id number] DOB: [birth year] PCP: Orma Flaming, MD     Subjective:  Chief Complaint  Patient presents with  . Flank Pain    HPI: The patient is a 49 y.o. female who presents today for pain in her right side. She says that it has been 2-3 weeks. She had a massage, but still didn't help. She has taken 2 tylenol and 2 ibuprofen at the same time. But pain has increased. Pain was so intense she almost passed out from pain.   She states she has had upper right pain and thought may be her gallbladder. Been intermittent for a couple of years.  She had a massage and dry needling and it didn't help. She states that the pain has moved into her RUQ/side. Pain was relieved with tylenol and ibuprofen, but today it didn't the pain. This morning was 10/10 and sharp and stabbing in nature. She felt like someone was grabbing her. She states it will hurt intensely and then be more dull in nature. Movement does make it worse. If she tries ot get up it will grab on that side. She has no association with food. Denies any nausea/vomiting/diarrhea. Denies any trauma to the area. It does hurt to take a deep breath. She denies any precipitating event. She has not worked out or done something. She denies any dysuria, urgency and frequency. She is not drinking much water. Pain right now if she is still is 3/10, but if she moves it is 8/10.   She does have history of constipation. Last BM was last night and little balls. This is normal for her and will build up and have a large stool.    Review of Systems  Gastrointestinal: Positive for abdominal pain. Negative for nausea and vomiting.  Genitourinary: Positive for flank pain. Negative for frequency.  Musculoskeletal: Negative for back pain.    Allergies Patient is allergic to theophylline, penicillins, pseudoephedrine, and sulfamethoxazole-trimethoprim.  Past Medical History Patient  has a past medical history of ADD (attention deficit  disorder), controlled with Adderall, Anemia, Anxiety, Arthritis, Chicken pox, Depression, Disorder of thyroid, GERD (gastroesophageal reflux disease), Heart murmur, Hyperthyroidism, Menstrual disorder, Migraine, Morbid obesity ([place]), Neurocardiogenic syncope, OSA on CPAP, Pre-diabetes, Swallowing difficulty, and Vitamin D deficiency.  Surgical History Patient  has a past surgical history that includes Urethral dilation (1979); Tilt table study; Wisdom tooth extraction; Dilatation & curettage/hysteroscopy with myosure (N/A, 06/08/2016); and Laparoscopic gastric sleeve resection (N/A, 02/20/2017).  Family History Pateint's family history includes Atrial fibrillation in her maternal grandmother; Cancer in her mother; Colon cancer (age of onset: 78) in her maternal grandfather; Depression in her mother; Diabetes in her father and mother; Heart attack (age of onset: 40) in her father; Heart disease in her father, mother, paternal grandfather, and paternal grandmother; Hyperlipidemia in her father and mother; Hypertension in her father and mother; Kidney disease in her mother; Obesity in her father and mother; Parkinson's disease in her maternal grandmother; Sleep apnea in her mother; Thyroid disease in her mother; Uterine cancer in her mother.  Social History Patient  reports that she quit smoking about 5 years ago. Her smoking use included cigarettes. She started smoking about 27 years ago. She has a 5.00 pack-year smoking history. She has never used smokeless tobacco. She reports current alcohol use. She reports that she does not use drugs.    Objective: Vitals:   04/08/20 1134  BP: 125/60  Pulse: 62  Temp: 97.9 F (36.6 C)  TempSrc:  Temporal  SpO2: 97%  Weight: (!) 307 lb 12.8 oz (139.6 kg)  Height: 5\' 5"  (1.651 m)    Body mass index is 51.22 kg/m.  Physical Exam Vitals reviewed.  Constitutional:      General: She is not in acute distress.    Appearance: Normal appearance. She is obese.  She is not ill-appearing or diaphoretic.  HENT:     Head: Normocephalic and atraumatic.  Cardiovascular:     Rate and Rhythm: Normal rate and regular rhythm.     Heart sounds: Normal heart sounds.  Pulmonary:     Effort: Pulmonary effort is normal.     Breath sounds: Normal breath sounds.  Abdominal:     General: Bowel sounds are normal.     Palpations: Abdomen is soft.     Tenderness: There is abdominal tenderness (right mid quadrant. negative murphy sign). There is no right CVA tenderness, left CVA tenderness, guarding or rebound.  Neurological:     General: No focal deficit present.     Mental Status: She is alert and oriented to person, place, and time.    UA: no blood, 2+ bili, + ketone, 1+ protein, + uro, negative nitrite.     Assessment/plan: 1. RUQ pain More mid/lateral pain. Pain is quite significant and differential is large. I tried to get CT scan, but insurance denied. We put in an xray to start with, labs and will see if she can get her bowels moving. She looks dehydrated off urine. Really encouraged her to drink more water and walk. Can start her miralax back. Discussed may just be large stool burden causing pain vs. Kidney stone (although no blood in urine and no urinary symptoms and no flank pain), MSK. Will give her some tramadol for severe pain and a muscle relaxer as well. If xray is normal may be able to get CT scan approved. Strict ER precautions given.  - CBC with Differential/Platelet; Future - Lipase; Future - COMPLETE METABOLIC PANEL WITH GFR; Future - COMPLETE METABOLIC PANEL WITH GFR - Lipase - CBC with Differential/Platelet - CT ABDOMEN PELVIS W WO CONTRAST; Future - DG Abd 2 Views; Future      This visit occurred during the SARS-CoV-2 public health emergency.  Safety protocols were in place, including screening questions prior to the visit, additional usage of staff PPE, and extensive cleaning of exam room while observing appropriate contact time as  indicated for disinfecting solutions.     Return if symptoms worsen or fail to improve.   Orma Flaming, MD [place]   04/08/2020

## 2020-04-09 ENCOUNTER — Encounter: Payer: Self-pay | Admitting: Family Medicine

## 2020-04-09 ENCOUNTER — Ambulatory Visit (INDEPENDENT_AMBULATORY_CARE_PROVIDER_SITE_OTHER)
Admission: RE | Admit: 2020-04-09 | Discharge: 2020-04-09 | Disposition: A | Discharge status: Home / Self Care | Payer: BC Managed Care – PPO | Source: Ambulatory Visit | Attending: Family Medicine | Admitting: Family Medicine

## 2020-04-09 DIAGNOSIS — R1011 Right upper quadrant pain: Secondary | ICD-10-CM | POA: Diagnosis not present

## 2020-04-09 DIAGNOSIS — R109 Unspecified abdominal pain: Secondary | ICD-10-CM | POA: Diagnosis not present

## 2020-04-09 DIAGNOSIS — K5900 Constipation, unspecified: Secondary | ICD-10-CM | POA: Diagnosis not present

## 2020-04-09 LAB — CBC WITH DIFFERENTIAL/PLATELET
Absolute Monocytes: 912 cells/uL (ref 200–950)
Basophils Absolute: 95 cells/uL (ref 0–200)
Basophils Relative: 1 %
Eosinophils Absolute: 304 cells/uL (ref 15–500)
Eosinophils Relative: 3.2 %
HCT: 37.4 % (ref 35.0–45.0)
Hemoglobin: 12.2 g/dL (ref 11.7–15.5)
Lymphs Abs: 1492 cells/uL (ref 850–3900)
MCH: 31.1 pg (ref 27.0–33.0)
MCHC: 32.6 g/dL (ref 32.0–36.0)
MCV: 95.4 fL (ref 80.0–100.0)
MPV: 9.2 fL (ref 7.5–12.5)
Monocytes Relative: 9.6 %
Neutro Abs: 6698 cells/uL (ref 1500–7800)
Neutrophils Relative %: 70.5 %
Platelets: 291 10*3/uL (ref 140–400)
RBC: 3.92 10*6/uL (ref 3.80–5.10)
RDW: 13.5 % (ref 11.0–15.0)
Total Lymphocyte: 15.7 %
WBC: 9.5 10*3/uL (ref 3.8–10.8)

## 2020-04-09 LAB — URINE CULTURE
MICRO NUMBER:: 11191574
SPECIMEN QUALITY:: ADEQUATE

## 2020-04-09 LAB — COMPLETE METABOLIC PANEL WITH GFR
AG Ratio: 1.8 (calc) (ref 1.0–2.5)
ALT: 19 U/L (ref 6–29)
AST: 20 U/L (ref 10–35)
Albumin: 4.1 g/dL (ref 3.6–5.1)
Alkaline phosphatase (APISO): 89 U/L (ref 31–125)
BUN: 18 mg/dL (ref 7–25)
CO2: 30 mmol/L (ref 20–32)
Calcium: 9.2 mg/dL (ref 8.6–10.2)
Chloride: 104 mmol/L (ref 98–110)
Creat: 0.83 mg/dL (ref 0.50–1.10)
GFR, Est African American: 97 mL/min/{1.73_m2} (ref >=60)
GFR, Est Non African American: 84 mL/min/{1.73_m2} (ref >=60)
Globulin: 2.3 g/dL (calc) (ref 1.9–3.7)
Glucose, Bld: 96 mg/dL (ref 65–99)
Potassium: 4.5 mmol/L (ref 3.5–5.3)
Sodium: 142 mmol/L (ref 135–146)
Total Bilirubin: 0.6 mg/dL (ref 0.2–1.2)
Total Protein: 6.4 g/dL (ref 6.1–8.1)

## 2020-04-09 LAB — LIPASE: Lipase: 12 U/L (ref 7–60)

## 2020-04-13 DIAGNOSIS — F4323 Adjustment disorder with mixed anxiety and depressed mood: Secondary | ICD-10-CM | POA: Diagnosis not present

## 2020-04-19 ENCOUNTER — Encounter: Payer: BC Managed Care – PPO | Admitting: Family Medicine

## 2020-04-20 ENCOUNTER — Encounter: Payer: Self-pay | Admitting: Family Medicine

## 2020-04-20 ENCOUNTER — Other Ambulatory Visit: Payer: Self-pay

## 2020-04-20 ENCOUNTER — Ambulatory Visit (INDEPENDENT_AMBULATORY_CARE_PROVIDER_SITE_OTHER): Payer: BC Managed Care – PPO | Admitting: Family Medicine

## 2020-04-20 DIAGNOSIS — M546 Pain in thoracic spine: Secondary | ICD-10-CM | POA: Diagnosis not present

## 2020-04-20 DIAGNOSIS — G8929 Other chronic pain: Secondary | ICD-10-CM

## 2020-04-20 DIAGNOSIS — Z713 Dietary counseling and surveillance: Secondary | ICD-10-CM | POA: Diagnosis not present

## 2020-04-20 DIAGNOSIS — R1031 Right lower quadrant pain: Secondary | ICD-10-CM

## 2020-04-20 DIAGNOSIS — R109 Unspecified abdominal pain: Secondary | ICD-10-CM

## 2020-04-20 DIAGNOSIS — M545 Low back pain, unspecified: Secondary | ICD-10-CM

## 2020-04-20 NOTE — Progress Notes (Signed)
I saw and examined the patient with Dr. Elouise Munroe and agree with assessment and plan as outlined.    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  She has had upper lumbar pain for quite a while, but in the past 3 months the pain has changed and she is having right flank pain and even right lower abdominal pain.  It is a sharp grabbing pain.  She has done physical therapy with dry needling as well as massage therapy.  Symptoms do not seem to be responding.  She takes ibuprofen and Tylenol and this seems to take the edge off her pain.  No bowel or bladder dysfunction.  Posterior right 10th and 11th ribs, tenderness in the right flank and lower abdomen.  No rebound or guarding.  Recent x-rays of the abdomen reveal possible thickening of the 10th rib, question prior fracture.  No other acute abnormality seen.  We will proceed with CT of the abdomen and pelvis as well as MRI of the thoracic spine.

## 2020-04-20 NOTE — Progress Notes (Signed)
Office Visit Note   Patient: Yvette White           Date of Birth: [birth year]           MRN: [id number] Visit Date: 04/20/2020 Requested by: Orma Flaming, [address] [place],  [place] [address] PCP: Orma Flaming, MD  Subjective: Chief Complaint  Patient presents with  . Lower Back - Pain    Chronic low back pain, bilaterally. For 4 yrs now, the pain has been higher and to the right flank region. Worsened over the past 3 weeks - pain moving around toward the abdomen. Sharp, grabbing pain.     HPI: 49yo F presenting to clinic with concerns of several years of chronic mid/lower back pain, which has progressed to radiating along her right lower ribs. 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 States over the past few weeks (approx 3), she has noticed a new pain in the front of her abdomen, around the lower right quadrant. Patient was initially seen by her PCM, who ordered blood, Urina, and Xray evaluations, with no obvious source of her symptoms. States that she thinks this may be a muscle, but is worried that is hasn't improved despite in-clinic PT, HEP, and massage. Denies fevers, N/V/D, does endorse some constipation, but states her PCM started miralax which has caused this to resolve. Pain is worsened with certain body movements, and can worsen with very deep inhalation. No change in urine.               ROS:   All other systems were reviewed and are negative.  Objective: Vital Signs: There were no vitals taken for this visit.  Physical Exam:  General:  Alert and oriented, in no acute distress. Pulm:  Breathing unlabored. Abd: Tenderness to palpation along right lower quadrant, no rebound, no guarding or other peritoneal signs. Positive Carnet's. No obvious masses.  Psy:  Normal mood, congruent affect. Skin:  No rashes, no bruising.   MSK: Normal  Gait.  Stands with excessive lumbar lordosis.  Palpation: Endorses tenderness to palpation of right lower thoracic paraspinal muscles, as well as pain with palpation of lower ribs (10 and 11). Pain with palpation of right QL. No significant tenderness over flexed obliques. Anterior psoas tenderpoint on right. ASIS with tenderness to palpation.  No midline spinal  tenderness, no deformity or step-offs.   Strength: Hip flexion (L1), Hip Aduction (L2), Knee Extension (L3) are 5/5 Bilaterally Foot Inversion (L4), Dorsiflexion (L5), and Eversion (S1) 5/5 Bilaterally Sensation: Intact to light touch medial and lateral aspects of lower extremities, and lateral, dorsal, and medial aspects of foot.   Imaging: No results found.  Assessment & Plan: 49yo F presenting to clinic with concerns of chronic right thoracic pain radiating into her lower ribs, as well as newer pain in the lower right abdomen. Evaluation by [facility] thus far is reassuring against systemic pathology, however it is concerning that these symptoms have not improved with aggressive MSK treatment thus far.  - Will order ABD Ct as well as Thoracic MRI to evaluate for underlying source of her discomfort.  - F/U pending imaging results. - Patient expresses understanding, and has no further questions or concerns today.      Procedures: No procedures performed  No notes on file     PMFS History: Patient Active Problem List   Diagnosis  Date Noted  . Situational anxiety 12/24/2018  . 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 Migraine without aura and without status migrainosus, not intractable 12/31/2017  . Chronic thoracic back pain 09/27/2017  . Low back pain 09/27/2017  . Former smoker, quit in 2016 07/29/2017  . Bladder spasms 07/29/2017  . History of abnormal mammogram 07/29/2017  . Thyroid condition, history of low TSH that normalized with monitoring 07/29/2017  . ADD (attention deficit disorder)   . OSA on CPAP     . Vitamin D deficiency 05/02/2017  . Pre-diabetes 05/02/2017  . 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 Bipolar 1 disorder ([place]), followed by Psych, controlled with Lamictal 01/24/2017  . Morbid obesity ([place]), s/p gastric sleeve, at time of surgery - weight 382, BMI 61 07/13/2016  . Abnormal menses 03/06/2016  . GERD (gastroesophageal reflux disease), on Prilosec 03/06/2016  . History of neurocardiogenic syncope, no episodes since 2017 03/06/2016  . Pharyngoesophageal dysphagia 03/06/2016  . Seasonal allergies, controlled with Claritin 08/25/2013   Past Medical History:  Diagnosis Date  . ADD (attention deficit disorder), controlled with Adderall   . Anemia   . Anxiety   . Arthritis   . Chicken pox   . Depression   . Disorder of thyroid   . GERD (gastroesophageal reflux disease)   . Heart murmur   . Hyperthyroidism   . Menstrual disorder    uterine fibriods, polyps,cysts  . Migraine   . Morbid obesity ([place])   . Neurocardiogenic syncope   . OSA on CPAP   . Pre-diabetes   . Swallowing difficulty   . Vitamin D deficiency     Family History  Problem Relation Age of Onset  . Diabetes Mother   . Heart disease Mother   . Uterine cancer Mother   . Hypertension Mother   . Hyperlipidemia Mother   . Kidney disease Mother   . Thyroid disease Mother   . Cancer Mother   . Depression Mother   . Sleep apnea Mother   . Obesity Mother   . Heart attack Father 39  . Diabetes Father   . Heart disease Father   . Hypertension Father   . Hyperlipidemia Father   . Obesity Father   . Atrial fibrillation Maternal Grandmother   . Parkinson's disease Maternal Grandmother   . Colon cancer Maternal Grandfather 83  . Heart disease Paternal Grandmother   . Heart disease Paternal Grandfather     Past Surgical History:  Procedure Laterality Date  . DILATATION & CURETTAGE/HYSTEROSCOPY WITH MYOSURE N/A 06/08/2016   Procedure: DILATATION & CURETTAGE/HYSTEROSCOPY;  Surgeon:  Louretta Shorten, MD;  Location: [facility];  Service: Gynecology;  Laterality: N/A;  . LAPAROSCOPIC GASTRIC SLEEVE RESECTION N/A 02/20/2017   Procedure: LAPAROSCOPIC GASTRIC SLEEVE RESECTION WITH UPPER ENDOSCOPY;  Surgeon: Johnathan Hausen, MD;  Location: [facility];  Service: General;  Laterality: N/A;  . TILT TABLE STUDY    . URETHRAL DILATION  1979  . WISDOM TOOTH EXTRACTION     Social History   Occupational History  . Occupation: Freight forwarder, Geophysicist/field seismologist, Instructor  Tobacco Use  . Smoking status: Former Smoker    Packs/day: 0.25    Years: 20.00    Pack years: 5.00    Types: Cigarettes    Start date: 63    Quit date: 09/30/2014    Years since quitting: 5.5  . Smokeless tobacco: Never Used  Substance and Sexual Activity  . Alcohol use: Yes    Comment: occ  .  Drug use: No  . Sexual activity: Not on file

## 2020-04-27 DIAGNOSIS — Z713 Dietary counseling and surveillance: Secondary | ICD-10-CM | POA: Diagnosis not present

## 2020-04-27 DIAGNOSIS — F4323 Adjustment disorder with mixed anxiety and depressed mood: Secondary | ICD-10-CM | POA: Diagnosis not present

## 2020-04-28 DIAGNOSIS — Z03818 Encounter for observation for suspected exposure to other biological agents ruled out: Secondary | ICD-10-CM | POA: Diagnosis not present

## 2020-04-30 ENCOUNTER — Encounter: Payer: BC Managed Care – PPO | Admitting: Family Medicine

## 2020-05-06 DIAGNOSIS — F4323 Adjustment disorder with mixed anxiety and depressed mood: Secondary | ICD-10-CM | POA: Diagnosis not present

## 2020-05-11 DIAGNOSIS — Z713 Dietary counseling and surveillance: Secondary | ICD-10-CM | POA: Diagnosis not present

## 2020-05-11 DIAGNOSIS — F4323 Adjustment disorder with mixed anxiety and depressed mood: Secondary | ICD-10-CM | POA: Diagnosis not present

## 2020-05-11 DIAGNOSIS — H43812 Vitreous degeneration, left eye: Secondary | ICD-10-CM | POA: Diagnosis not present

## 2020-05-12 ENCOUNTER — Ambulatory Visit
Admission: RE | Admit: 2020-05-12 | Discharge: 2020-05-12 | Disposition: A | Discharge status: Home / Self Care | Payer: BC Managed Care – PPO | Source: Ambulatory Visit | Attending: Family Medicine | Admitting: Family Medicine

## 2020-05-12 ENCOUNTER — Other Ambulatory Visit: Payer: Self-pay

## 2020-05-12 DIAGNOSIS — R1031 Right lower quadrant pain: Secondary | ICD-10-CM

## 2020-05-13 ENCOUNTER — Encounter: Payer: Self-pay | Admitting: Family Medicine

## 2020-05-13 ENCOUNTER — Telehealth: Payer: Self-pay | Admitting: Family Medicine

## 2020-05-13 NOTE — Telephone Encounter (Signed)
CT does not show any source of pain.  Will await MRI results.

## 2020-05-14 ENCOUNTER — Ambulatory Visit
Admission: RE | Admit: 2020-05-14 | Discharge: 2020-05-14 | Disposition: A | Discharge status: Home / Self Care | Payer: BC Managed Care – PPO | Source: Ambulatory Visit | Attending: Family Medicine | Admitting: Family Medicine

## 2020-05-14 ENCOUNTER — Telehealth: Payer: Self-pay | Admitting: Family Medicine

## 2020-05-14 DIAGNOSIS — R109 Unspecified abdominal pain: Secondary | ICD-10-CM

## 2020-05-14 DIAGNOSIS — M5124 Other intervertebral disc displacement, thoracic region: Secondary | ICD-10-CM | POA: Diagnosis not present

## 2020-05-14 DIAGNOSIS — M5125 Other intervertebral disc displacement, thoracolumbar region: Secondary | ICD-10-CM | POA: Diagnosis not present

## 2020-05-14 DIAGNOSIS — M47814 Spondylosis without myelopathy or radiculopathy, thoracic region: Secondary | ICD-10-CM | POA: Diagnosis not present

## 2020-05-14 DIAGNOSIS — M546 Pain in thoracic spine: Secondary | ICD-10-CM

## 2020-05-14 DIAGNOSIS — R1031 Right lower quadrant pain: Secondary | ICD-10-CM

## 2020-05-14 DIAGNOSIS — M40204 Unspecified kyphosis, thoracic region: Secondary | ICD-10-CM | POA: Diagnosis not present

## 2020-05-14 NOTE — Telephone Encounter (Signed)
Thoracic MRI looks good, no disc protrusions or nerve impingement.  Unfortunately, nothing to explain the pain.

## 2020-05-17 DIAGNOSIS — F4323 Adjustment disorder with mixed anxiety and depressed mood: Secondary | ICD-10-CM | POA: Diagnosis not present

## 2020-05-18 DIAGNOSIS — M9903 Segmental and somatic dysfunction of lumbar region: Secondary | ICD-10-CM | POA: Diagnosis not present

## 2020-05-18 DIAGNOSIS — M545 Low back pain, unspecified: Secondary | ICD-10-CM | POA: Diagnosis not present

## 2020-05-18 DIAGNOSIS — M546 Pain in thoracic spine: Secondary | ICD-10-CM | POA: Diagnosis not present

## 2020-05-18 DIAGNOSIS — M9902 Segmental and somatic dysfunction of thoracic region: Secondary | ICD-10-CM | POA: Diagnosis not present

## 2020-05-19 DIAGNOSIS — M9902 Segmental and somatic dysfunction of thoracic region: Secondary | ICD-10-CM | POA: Diagnosis not present

## 2020-05-19 DIAGNOSIS — M9903 Segmental and somatic dysfunction of lumbar region: Secondary | ICD-10-CM | POA: Diagnosis not present

## 2020-05-19 DIAGNOSIS — M546 Pain in thoracic spine: Secondary | ICD-10-CM | POA: Diagnosis not present

## 2020-05-19 DIAGNOSIS — M545 Low back pain, unspecified: Secondary | ICD-10-CM | POA: Diagnosis not present

## 2020-05-19 MED ORDER — TIZANIDINE HCL 2 MG PO TABS
2.0000 mg | ORAL_TABLET | Freq: Four times a day (QID) | ORAL | 1 refills | Status: DC | PRN
Start: 2020-05-19 — End: 2020-10-20

## 2020-05-19 MED ORDER — NABUMETONE 500 MG PO TABS: 500.0000 mg | ORAL_TABLET | Freq: Two times a day (BID) | ORAL | 3 refills | Status: DC | PRN

## 2020-05-19 NOTE — Addendum Note (Signed)
Addended by: Hortencia Pilar on: 05/19/2020 11:41 AM   Modules accepted: Orders

## 2020-05-24 DIAGNOSIS — M546 Pain in thoracic spine: Secondary | ICD-10-CM | POA: Diagnosis not present

## 2020-05-24 DIAGNOSIS — M545 Low back pain, unspecified: Secondary | ICD-10-CM | POA: Diagnosis not present

## 2020-05-24 DIAGNOSIS — M9902 Segmental and somatic dysfunction of thoracic region: Secondary | ICD-10-CM | POA: Diagnosis not present

## 2020-05-24 DIAGNOSIS — M9903 Segmental and somatic dysfunction of lumbar region: Secondary | ICD-10-CM | POA: Diagnosis not present

## 2020-05-24 DIAGNOSIS — Z20822 Contact with and (suspected) exposure to covid-19: Secondary | ICD-10-CM | POA: Diagnosis not present

## 2020-05-26 DIAGNOSIS — M546 Pain in thoracic spine: Secondary | ICD-10-CM | POA: Diagnosis not present

## 2020-05-26 DIAGNOSIS — M9902 Segmental and somatic dysfunction of thoracic region: Secondary | ICD-10-CM | POA: Diagnosis not present

## 2020-05-26 DIAGNOSIS — M545 Low back pain, unspecified: Secondary | ICD-10-CM | POA: Diagnosis not present

## 2020-05-26 DIAGNOSIS — M9903 Segmental and somatic dysfunction of lumbar region: Secondary | ICD-10-CM | POA: Diagnosis not present

## 2020-05-27 DIAGNOSIS — F4323 Adjustment disorder with mixed anxiety and depressed mood: Secondary | ICD-10-CM | POA: Diagnosis not present

## 2020-05-28 DIAGNOSIS — F4323 Adjustment disorder with mixed anxiety and depressed mood: Secondary | ICD-10-CM | POA: Diagnosis not present

## 2020-05-31 DIAGNOSIS — M9902 Segmental and somatic dysfunction of thoracic region: Secondary | ICD-10-CM | POA: Diagnosis not present

## 2020-05-31 DIAGNOSIS — M546 Pain in thoracic spine: Secondary | ICD-10-CM | POA: Diagnosis not present

## 2020-05-31 DIAGNOSIS — M9903 Segmental and somatic dysfunction of lumbar region: Secondary | ICD-10-CM | POA: Diagnosis not present

## 2020-05-31 DIAGNOSIS — Z03818 Encounter for observation for suspected exposure to other biological agents ruled out: Secondary | ICD-10-CM | POA: Diagnosis not present

## 2020-05-31 DIAGNOSIS — M545 Low back pain, unspecified: Secondary | ICD-10-CM | POA: Diagnosis not present

## 2020-06-01 DIAGNOSIS — Z713 Dietary counseling and surveillance: Secondary | ICD-10-CM | POA: Diagnosis not present

## 2020-06-01 DIAGNOSIS — F4323 Adjustment disorder with mixed anxiety and depressed mood: Secondary | ICD-10-CM | POA: Diagnosis not present

## 2020-06-02 DIAGNOSIS — H0510 Unspecified chronic inflammatory disorders of orbit: Secondary | ICD-10-CM | POA: Diagnosis not present

## 2020-06-02 DIAGNOSIS — Z23 Encounter for immunization: Secondary | ICD-10-CM | POA: Diagnosis not present

## 2020-06-02 DIAGNOSIS — Z79899 Other long term (current) drug therapy: Secondary | ICD-10-CM | POA: Diagnosis not present

## 2020-06-07 DIAGNOSIS — M9903 Segmental and somatic dysfunction of lumbar region: Secondary | ICD-10-CM | POA: Diagnosis not present

## 2020-06-07 DIAGNOSIS — M9902 Segmental and somatic dysfunction of thoracic region: Secondary | ICD-10-CM | POA: Diagnosis not present

## 2020-06-07 DIAGNOSIS — M545 Low back pain, unspecified: Secondary | ICD-10-CM | POA: Diagnosis not present

## 2020-06-07 DIAGNOSIS — M546 Pain in thoracic spine: Secondary | ICD-10-CM | POA: Diagnosis not present

## 2020-06-08 DIAGNOSIS — F4323 Adjustment disorder with mixed anxiety and depressed mood: Secondary | ICD-10-CM | POA: Diagnosis not present

## 2020-06-08 DIAGNOSIS — Z713 Dietary counseling and surveillance: Secondary | ICD-10-CM | POA: Diagnosis not present

## 2020-06-09 DIAGNOSIS — M545 Low back pain, unspecified: Secondary | ICD-10-CM | POA: Diagnosis not present

## 2020-06-09 DIAGNOSIS — M9902 Segmental and somatic dysfunction of thoracic region: Secondary | ICD-10-CM | POA: Diagnosis not present

## 2020-06-09 DIAGNOSIS — M9903 Segmental and somatic dysfunction of lumbar region: Secondary | ICD-10-CM | POA: Diagnosis not present

## 2020-06-09 DIAGNOSIS — M546 Pain in thoracic spine: Secondary | ICD-10-CM | POA: Diagnosis not present

## 2020-06-11 DIAGNOSIS — Z20822 Contact with and (suspected) exposure to covid-19: Secondary | ICD-10-CM | POA: Diagnosis not present

## 2020-06-15 ENCOUNTER — Encounter: Payer: Self-pay | Admitting: Family Medicine

## 2020-06-16 ENCOUNTER — Encounter: Payer: Self-pay | Admitting: Family Medicine

## 2020-06-16 DIAGNOSIS — F4323 Adjustment disorder with mixed anxiety and depressed mood: Secondary | ICD-10-CM | POA: Diagnosis not present

## 2020-06-16 NOTE — Telephone Encounter (Signed)
Patient is calling in and wanting to follow up on this.

## 2020-06-17 ENCOUNTER — Telehealth (INDEPENDENT_AMBULATORY_CARE_PROVIDER_SITE_OTHER): Payer: BC Managed Care – PPO | Admitting: Family Medicine

## 2020-06-17 ENCOUNTER — Encounter: Payer: Self-pay | Admitting: Family Medicine

## 2020-06-17 ENCOUNTER — Other Ambulatory Visit: Payer: Self-pay

## 2020-06-17 VITALS — Ht 65.0 in | Wt 307.8 lb

## 2020-06-17 DIAGNOSIS — U071 COVID-19: Secondary | ICD-10-CM | POA: Diagnosis not present

## 2020-06-17 DIAGNOSIS — J0100 Acute maxillary sinusitis, unspecified: Secondary | ICD-10-CM | POA: Diagnosis not present

## 2020-06-17 MED ORDER — PREDNISONE 20 MG PO TABS: 40.0000 mg | ORAL_TABLET | Freq: Every day | ORAL | 0 refills | Status: DC

## 2020-06-17 MED ORDER — DOXYCYCLINE HYCLATE 100 MG PO TABS: 100.0000 mg | ORAL_TABLET | Freq: Two times a day (BID) | ORAL | 0 refills | Status: DC

## 2020-06-17 NOTE — Progress Notes (Signed)
Patient: Yvette White MRN: [id number] DOB: 18-Jun-1970 PCP: Orma Flaming, MD     I connected with Sharlyne Cai on 06/17/20 at 10:17am by a video enabled telemedicine application and verified that I am speaking with the correct person using two identifiers.  Location patient: Home Location provider: [facility], Office Persons participating in this virtual visit: Teressa Mcglocklin and [name]   I discussed the limitations of evaluation and management by telemedicine and the availability of in person appointments. The patient expressed understanding and agreed to proceed.   Subjective:  Chief Complaint  Patient presents with  . Covid Positive    Symptoms started on Thursday of last week.     HPI: The patient is a 50 y.o. female who presents today for positive Covid result. Symptoms started on last Thursday with a tickle in between her collar bone. She states she then felt a headache and then her teeth started to hurt and she had sinus pain and pressure. She felt like she was getting a cold. Friday morning she woke up and felt terrible with a worsening headache, low grade fever, nasal congestion, sore throat and cough. She did a home test and it was negative so she went to [facility] and did a PCR test. This came back positive on Sunday night. Monday she felt better and has continued to feel better, but she feels like she is getting worse in other respects. Her symptoms are still present, but just not as severe. She feels like her face is going to explode and feels like someone is scrapping her sinuses. She lost her taste and smell as well. Her cough is more dry, but at times is productive. She has been taking tylenol, robitussin and emegen C. She is only occasionally tight in her chest. She is not short of breath at all and her oxygen is fine. No fevers and feels better in the AM.   She is on prednisone daily down to 3mg /day. She has been vaccinated. Pfizer 06/18/19 and 07/09/19. No booster.   Review  of Systems  Constitutional: Positive for fatigue.  HENT: Positive for congestion.   Respiratory: Positive for cough.   Neurological: Positive for dizziness and headaches.    Allergies Patient is allergic to theophylline, penicillins, pseudoephedrine, and sulfamethoxazole-trimethoprim.  Past Medical History Patient  has a past medical history of ADD (attention deficit disorder), controlled with Adderall, Anemia, Anxiety, Arthritis, Chicken pox, Depression, Disorder of thyroid, GERD (gastroesophageal reflux disease), Heart murmur, Hyperthyroidism, Menstrual disorder, Migraine, Morbid obesity ([place]), Neurocardiogenic syncope, OSA on CPAP, Pre-diabetes, Swallowing difficulty, and Vitamin D deficiency.  Surgical History Patient  has a past surgical history that includes Urethral dilation (1979); Tilt table study; Wisdom tooth extraction; Dilatation & curettage/hysteroscopy with myosure (N/A, 06/08/2016); and Laparoscopic gastric sleeve resection (N/A, 02/20/2017).  Family History Pateint's family history includes Atrial fibrillation in her maternal grandmother; Cancer in her mother; Colon cancer (age of onset: 68) in her maternal grandfather; Depression in her mother; Diabetes in her father and mother; Heart attack (age of onset: 40) in her father; Heart disease in her father, mother, paternal grandfather, and paternal grandmother; Hyperlipidemia in her father and mother; Hypertension in her father and mother; Kidney disease in her mother; Obesity in her father and mother; Parkinson's disease in her maternal grandmother; Sleep apnea in her mother; Thyroid disease in her mother; Uterine cancer in her mother.  Social History Patient  reports that she quit smoking about 5 years ago. Her smoking use included cigarettes. She  started smoking about 28 years ago. She has a 5.00 pack-year smoking history. She has never used smokeless tobacco. She reports current alcohol use. She reports that she does not use  drugs.    Objective: Vitals:   06/17/20 0953  SpO2: 95%  Weight: (!) 307 lb 12.2 oz (139.6 kg)  Height: 5\' 5"  (1.651 m)    Body mass index is 51.21 kg/m.  Physical Exam Vitals reviewed.  Constitutional:      General: She is not in acute distress.    Appearance: Normal appearance. She is obese. She is not ill-appearing.  HENT:     Head: Normocephalic and atraumatic.  Pulmonary:     Effort: Pulmonary effort is normal.     Comments: No increased work of breathing, no increased effort. Talking in full complete sentences.  Neurological:     General: No focal deficit present.     Mental Status: She is alert and oriented to person, place, and time.  Psychiatric:        Mood and Affect: Mood normal.        Behavior: Behavior normal.        Assessment/plan: 1. COVID-19 -outside of 5 day window for new pharmaceuticals.  -starting her on treatment nutraceutical bundle including zinc sulfate, vit D, vit C, quercetin and melatonin 5-15+ days depending on symptoms.  -honey and robitussin DM for cough -teeth hurt and congestion. covid vs. Sinusitis. Starting her on doxycycline and prednisone burst. Doesn't tolerate flonase due to nose bleeds.  -At this point very mild pulmonary signs/symptoms that do not warrant additional treatment. If so we will add prozac 30mg /day, pulmicort and if needed anti-androgens and oral steroids. Will follow closely.  -gargle mouthwash TID -outside as much as possible.  -pulse ox >93-94% -prone sleeping if possible.  -ER precautions given.   2. Sinusitis vs. covid 19 -cool mist humidifier at night -doxy and prednisone burst  Keep in close contact.      Return if symptoms worsen or fail to improve.    Orma Flaming, MD [place]  06/17/2020

## 2020-06-21 ENCOUNTER — Telehealth: Payer: Self-pay

## 2020-06-21 ENCOUNTER — Other Ambulatory Visit: Payer: Self-pay | Admitting: Family Medicine

## 2020-06-21 NOTE — Progress Notes (Signed)
Wrote letter for withdrawal from grad school due to covid 19 and answered all questions.  [name]

## 2020-06-21 NOTE — Telephone Encounter (Signed)
Patient states she had a mychart visit with [name] in regard to [place].   Patient states she had a few questions for [name], but did not want to speak with Melitta.    Wanted to know if [name] would call her directly?

## 2020-06-21 NOTE — Telephone Encounter (Signed)
See note in chart. Called and answered all questions.  Orma Flaming, MD [place]

## 2020-06-23 DIAGNOSIS — F4323 Adjustment disorder with mixed anxiety and depressed mood: Secondary | ICD-10-CM | POA: Diagnosis not present

## 2020-06-24 ENCOUNTER — Encounter: Payer: Self-pay | Admitting: Family Medicine

## 2020-06-24 NOTE — Telephone Encounter (Signed)
Pt is following up on this 

## 2020-06-25 ENCOUNTER — Other Ambulatory Visit: Payer: Self-pay

## 2020-06-25 ENCOUNTER — Encounter: Payer: Self-pay | Admitting: Family Medicine

## 2020-06-25 ENCOUNTER — Ambulatory Visit (INDEPENDENT_AMBULATORY_CARE_PROVIDER_SITE_OTHER): Payer: BC Managed Care – PPO

## 2020-06-25 ENCOUNTER — Ambulatory Visit (INDEPENDENT_AMBULATORY_CARE_PROVIDER_SITE_OTHER): Payer: BC Managed Care – PPO | Admitting: Family Medicine

## 2020-06-25 VITALS — BP 118/64 | HR 73 | Temp 97.8°F | Ht 65.0 in | Wt 307.6 lb

## 2020-06-25 DIAGNOSIS — R059 Cough, unspecified: Secondary | ICD-10-CM | POA: Diagnosis not present

## 2020-06-25 DIAGNOSIS — U071 COVID-19: Secondary | ICD-10-CM | POA: Diagnosis not present

## 2020-06-25 DIAGNOSIS — H43812 Vitreous degeneration, left eye: Secondary | ICD-10-CM | POA: Diagnosis not present

## 2020-06-25 MED ORDER — FLOVENT HFA 110 MCG/ACT IN AERO: 2.0000 | INHALATION_SPRAY | Freq: Two times a day (BID) | RESPIRATORY_TRACT | 12 refills | Status: DC

## 2020-06-25 NOTE — Progress Notes (Signed)
Patient: Yvette White MRN: [id number] DOB: 11/23/1970 PCP: Orma Flaming, MD     Subjective:  Chief Complaint  Patient presents with  . Covid    Follow up; Chest Xray    HPI: The patient is a 50 y.o. female who presents today for Chest xray, for cough due to Covid. I saw her on video visit on 06/17/20. Started her on nuetraceutical bundle, honey, robitussin DM as well as gave her antibiotics and steroids for possible sinus infection. She overall feels better. She has been able to work and go to school, but the cough keeps her up at night. Also feels like she has stuff that needs to be broken up in her lungs. Has been taking mucinex with not much relief. No fevers, no chills. She has no shortness of breath. Currently still on steroids and abx.   Review of Systems  Constitutional: Positive for fatigue. Negative for chills, diaphoresis and fever.  HENT: Positive for congestion.   Respiratory: Positive for cough. Negative for chest tightness, shortness of breath and wheezing.   Gastrointestinal: Negative for abdominal pain, diarrhea, nausea and vomiting.    Allergies Patient is allergic to theophylline, penicillins, pseudoephedrine, and sulfamethoxazole-trimethoprim.  Past Medical History Patient  has a past medical history of ADD (attention deficit disorder), controlled with Adderall, Anemia, Anxiety, Arthritis, Chicken pox, Depression, Disorder of thyroid, GERD (gastroesophageal reflux disease), Heart murmur, Hyperthyroidism, Menstrual disorder, Migraine, Morbid obesity ([place]), Neurocardiogenic syncope, OSA on CPAP, Pre-diabetes, Swallowing difficulty, and Vitamin D deficiency.  Surgical History Patient  has a past surgical history that includes Urethral dilation (1979); Tilt table study; Wisdom tooth extraction; Dilatation & curettage/hysteroscopy with myosure (N/A, 06/08/2016); and Laparoscopic gastric sleeve resection (N/A, 02/20/2017).  Family History Pateint's family history  includes Atrial fibrillation in her maternal grandmother; Cancer in her mother; Colon cancer (age of onset: 81) in her maternal grandfather; Depression in her mother; Diabetes in her father and mother; Heart attack (age of onset: 15) in her father; Heart disease in her father, mother, paternal grandfather, and paternal grandmother; Hyperlipidemia in her father and mother; Hypertension in her father and mother; Kidney disease in her mother; Obesity in her father and mother; Parkinson's disease in her maternal grandmother; Sleep apnea in her mother; Thyroid disease in her mother; Uterine cancer in her mother.  Social History Patient  reports that she quit smoking about 5 years ago. Her smoking use included cigarettes. She started smoking about 28 years ago. She has a 5.00 pack-year smoking history. She has never used smokeless tobacco. She reports current alcohol use. She reports that she does not use drugs.    Objective: Vitals:   06/25/20 1428  BP: 118/64  Pulse: 73  Temp: 97.8 F (36.6 C)  TempSrc: Temporal  SpO2: 97%  Weight: (!) 307 lb 9.6 oz (139.5 kg)  Height: 5\' 5"  (1.651 m)    Body mass index is 51.19 kg/m.  Physical Exam Vitals reviewed.  Constitutional:      General: She is not in acute distress.    Appearance: Normal appearance. She is obese. She is not ill-appearing.  HENT:     Head: Normocephalic and atraumatic.     Right Ear: Tympanic membrane and ear canal normal.     Left Ear: Tympanic membrane and ear canal normal.     Nose: Congestion present.  Cardiovascular:     Rate and Rhythm: Normal rate and regular rhythm.     Heart sounds: Normal heart sounds.  Pulmonary:  Effort: Pulmonary effort is normal. No respiratory distress.     Breath sounds: Normal breath sounds. No stridor. No wheezing.  Abdominal:     General: Bowel sounds are normal.     Palpations: Abdomen is soft.  Musculoskeletal:     Cervical back: Normal range of motion and neck supple.  Skin:     Capillary Refill: Capillary refill takes less than 2 seconds.  Neurological:     General: No focal deficit present.     Mental Status: She is alert and oriented to person, place, and time.  Psychiatric:        Mood and Affect: Mood normal.        Behavior: Behavior normal.    CXR: no acute consolidation. Has some left CVA cloudiness, but I think more due to body habitus. Not seen on lateral view.     Assessment/plan: 1. COVID-19 Slowly improving with clear lung exam today. Finish out abx and steroids. Will send in flovent to use prn to help with cough. Suggested mucinex dm twice a day and nyquil at night to help with sleep if possible. Do not want to give codeine and suppress respiratory system at all. Discussed this typically takes time to resolve. Let me know if any changes.  - DG Chest 2 View; Future - DG Chest 2 View  This visit occurred during the SARS-CoV-2 public health emergency.  Safety protocols were in place, including screening questions prior to the visit, additional usage of staff PPE, and extensive cleaning of exam room while observing appropriate contact time as indicated for disinfecting solutions.     Return if symptoms worsen or fail to improve.   Orma Flaming, MD [place]   06/25/2020

## 2020-06-29 DIAGNOSIS — F4323 Adjustment disorder with mixed anxiety and depressed mood: Secondary | ICD-10-CM | POA: Diagnosis not present

## 2020-06-29 DIAGNOSIS — M546 Pain in thoracic spine: Secondary | ICD-10-CM | POA: Diagnosis not present

## 2020-06-29 DIAGNOSIS — M9903 Segmental and somatic dysfunction of lumbar region: Secondary | ICD-10-CM | POA: Diagnosis not present

## 2020-06-29 DIAGNOSIS — M9902 Segmental and somatic dysfunction of thoracic region: Secondary | ICD-10-CM | POA: Diagnosis not present

## 2020-06-29 DIAGNOSIS — M545 Low back pain, unspecified: Secondary | ICD-10-CM | POA: Diagnosis not present

## 2020-07-06 DIAGNOSIS — Z713 Dietary counseling and surveillance: Secondary | ICD-10-CM | POA: Diagnosis not present

## 2020-07-06 DIAGNOSIS — F4323 Adjustment disorder with mixed anxiety and depressed mood: Secondary | ICD-10-CM | POA: Diagnosis not present

## 2020-07-12 DIAGNOSIS — M9902 Segmental and somatic dysfunction of thoracic region: Secondary | ICD-10-CM | POA: Diagnosis not present

## 2020-07-12 DIAGNOSIS — M545 Low back pain, unspecified: Secondary | ICD-10-CM | POA: Diagnosis not present

## 2020-07-12 DIAGNOSIS — M9903 Segmental and somatic dysfunction of lumbar region: Secondary | ICD-10-CM | POA: Diagnosis not present

## 2020-07-12 DIAGNOSIS — M546 Pain in thoracic spine: Secondary | ICD-10-CM | POA: Diagnosis not present

## 2020-07-13 DIAGNOSIS — Z713 Dietary counseling and surveillance: Secondary | ICD-10-CM | POA: Diagnosis not present

## 2020-07-13 DIAGNOSIS — F4323 Adjustment disorder with mixed anxiety and depressed mood: Secondary | ICD-10-CM | POA: Diagnosis not present

## 2020-07-15 DIAGNOSIS — F4323 Adjustment disorder with mixed anxiety and depressed mood: Secondary | ICD-10-CM | POA: Diagnosis not present

## 2020-07-20 DIAGNOSIS — F4323 Adjustment disorder with mixed anxiety and depressed mood: Secondary | ICD-10-CM | POA: Diagnosis not present

## 2020-07-20 DIAGNOSIS — Z713 Dietary counseling and surveillance: Secondary | ICD-10-CM | POA: Diagnosis not present

## 2020-07-26 DIAGNOSIS — M546 Pain in thoracic spine: Secondary | ICD-10-CM | POA: Diagnosis not present

## 2020-07-26 DIAGNOSIS — M5416 Radiculopathy, lumbar region: Secondary | ICD-10-CM | POA: Diagnosis not present

## 2020-07-26 DIAGNOSIS — M545 Low back pain, unspecified: Secondary | ICD-10-CM | POA: Diagnosis not present

## 2020-07-26 DIAGNOSIS — M9902 Segmental and somatic dysfunction of thoracic region: Secondary | ICD-10-CM | POA: Diagnosis not present

## 2020-07-27 DIAGNOSIS — F509 Eating disorder, unspecified: Secondary | ICD-10-CM | POA: Diagnosis not present

## 2020-07-27 DIAGNOSIS — F4323 Adjustment disorder with mixed anxiety and depressed mood: Secondary | ICD-10-CM | POA: Diagnosis not present

## 2020-08-02 DIAGNOSIS — M5416 Radiculopathy, lumbar region: Secondary | ICD-10-CM | POA: Diagnosis not present

## 2020-08-02 DIAGNOSIS — M9902 Segmental and somatic dysfunction of thoracic region: Secondary | ICD-10-CM | POA: Diagnosis not present

## 2020-08-02 DIAGNOSIS — M546 Pain in thoracic spine: Secondary | ICD-10-CM | POA: Diagnosis not present

## 2020-08-02 DIAGNOSIS — M545 Low back pain, unspecified: Secondary | ICD-10-CM | POA: Diagnosis not present

## 2020-08-03 DIAGNOSIS — F4323 Adjustment disorder with mixed anxiety and depressed mood: Secondary | ICD-10-CM | POA: Diagnosis not present

## 2020-08-03 DIAGNOSIS — F509 Eating disorder, unspecified: Secondary | ICD-10-CM | POA: Diagnosis not present

## 2020-08-09 DIAGNOSIS — M546 Pain in thoracic spine: Secondary | ICD-10-CM | POA: Diagnosis not present

## 2020-08-09 DIAGNOSIS — M9902 Segmental and somatic dysfunction of thoracic region: Secondary | ICD-10-CM | POA: Diagnosis not present

## 2020-08-09 DIAGNOSIS — M545 Low back pain, unspecified: Secondary | ICD-10-CM | POA: Diagnosis not present

## 2020-08-09 DIAGNOSIS — M5416 Radiculopathy, lumbar region: Secondary | ICD-10-CM | POA: Diagnosis not present

## 2020-08-12 DIAGNOSIS — M5416 Radiculopathy, lumbar region: Secondary | ICD-10-CM | POA: Diagnosis not present

## 2020-08-12 DIAGNOSIS — M9902 Segmental and somatic dysfunction of thoracic region: Secondary | ICD-10-CM | POA: Diagnosis not present

## 2020-08-12 DIAGNOSIS — M545 Low back pain, unspecified: Secondary | ICD-10-CM | POA: Diagnosis not present

## 2020-08-12 DIAGNOSIS — M546 Pain in thoracic spine: Secondary | ICD-10-CM | POA: Diagnosis not present

## 2020-08-17 DIAGNOSIS — F4323 Adjustment disorder with mixed anxiety and depressed mood: Secondary | ICD-10-CM | POA: Diagnosis not present

## 2020-08-17 DIAGNOSIS — F509 Eating disorder, unspecified: Secondary | ICD-10-CM | POA: Diagnosis not present

## 2020-08-23 DIAGNOSIS — D225 Melanocytic nevi of trunk: Secondary | ICD-10-CM | POA: Diagnosis not present

## 2020-08-23 DIAGNOSIS — M9902 Segmental and somatic dysfunction of thoracic region: Secondary | ICD-10-CM | POA: Diagnosis not present

## 2020-08-23 DIAGNOSIS — L718 Other rosacea: Secondary | ICD-10-CM | POA: Diagnosis not present

## 2020-08-23 DIAGNOSIS — L720 Epidermal cyst: Secondary | ICD-10-CM | POA: Diagnosis not present

## 2020-08-23 DIAGNOSIS — L821 Other seborrheic keratosis: Secondary | ICD-10-CM | POA: Diagnosis not present

## 2020-08-23 DIAGNOSIS — M546 Pain in thoracic spine: Secondary | ICD-10-CM | POA: Diagnosis not present

## 2020-08-23 DIAGNOSIS — M545 Low back pain, unspecified: Secondary | ICD-10-CM | POA: Diagnosis not present

## 2020-08-23 DIAGNOSIS — M5416 Radiculopathy, lumbar region: Secondary | ICD-10-CM | POA: Diagnosis not present

## 2020-08-24 DIAGNOSIS — F509 Eating disorder, unspecified: Secondary | ICD-10-CM | POA: Diagnosis not present

## 2020-08-24 DIAGNOSIS — F4323 Adjustment disorder with mixed anxiety and depressed mood: Secondary | ICD-10-CM | POA: Diagnosis not present

## 2020-08-31 DIAGNOSIS — F509 Eating disorder, unspecified: Secondary | ICD-10-CM | POA: Diagnosis not present

## 2020-08-31 DIAGNOSIS — F4323 Adjustment disorder with mixed anxiety and depressed mood: Secondary | ICD-10-CM | POA: Diagnosis not present

## 2020-09-01 ENCOUNTER — Encounter (HOSPITAL_COMMUNITY): Payer: Self-pay | Admitting: *Deleted

## 2020-09-03 ENCOUNTER — Encounter: Payer: Self-pay | Admitting: Family Medicine

## 2020-09-03 ENCOUNTER — Other Ambulatory Visit: Payer: Self-pay

## 2020-09-06 DIAGNOSIS — Z20822 Contact with and (suspected) exposure to covid-19: Secondary | ICD-10-CM | POA: Diagnosis not present

## 2020-09-14 DIAGNOSIS — F4323 Adjustment disorder with mixed anxiety and depressed mood: Secondary | ICD-10-CM | POA: Diagnosis not present

## 2020-09-15 DIAGNOSIS — F4323 Adjustment disorder with mixed anxiety and depressed mood: Secondary | ICD-10-CM | POA: Diagnosis not present

## 2020-09-21 DIAGNOSIS — F509 Eating disorder, unspecified: Secondary | ICD-10-CM | POA: Diagnosis not present

## 2020-09-21 DIAGNOSIS — F4323 Adjustment disorder with mixed anxiety and depressed mood: Secondary | ICD-10-CM | POA: Diagnosis not present

## 2020-09-22 ENCOUNTER — Ambulatory Visit (INDEPENDENT_AMBULATORY_CARE_PROVIDER_SITE_OTHER): Payer: BC Managed Care – PPO | Admitting: Family Medicine

## 2020-09-22 ENCOUNTER — Encounter: Payer: Self-pay | Admitting: Family Medicine

## 2020-09-22 ENCOUNTER — Other Ambulatory Visit: Payer: Self-pay

## 2020-09-22 VITALS — BP 128/78 | HR 63 | Temp 97.9°F | Ht 65.0 in | Wt 313.0 lb

## 2020-09-22 DIAGNOSIS — U071 COVID-19: Secondary | ICD-10-CM

## 2020-09-22 DIAGNOSIS — Z1211 Encounter for screening for malignant neoplasm of colon: Secondary | ICD-10-CM | POA: Diagnosis not present

## 2020-09-22 DIAGNOSIS — T7840XA Allergy, unspecified, initial encounter: Secondary | ICD-10-CM

## 2020-09-22 MED ORDER — MONTELUKAST SODIUM 10 MG PO TABS: 10.0000 mg | ORAL_TABLET | Freq: Every day | ORAL | 1 refills | Status: DC

## 2020-09-22 NOTE — Progress Notes (Signed)
Patient: Yvette White MRN: [id number] DOB: 26-Apr-1971 PCP: Orma Flaming, MD     Subjective:  Chief Complaint  Patient presents with  . Discuss colonoscopy    Family Hx of Colon cancer  . Allergies    HPI: The patient is a 50 y.o. female who presents today to discuss colon cancer screening. She saw on the news that some people are screening at age 3 years. Her maternal grandfather also had colon cancer. She has no symptoms. She has no hx of change in BM, no stomach pain, no blood in stool. She She no recent unexpected weight loss. No hx of IBD with her or in family.   Also would like her covid antibodies checked after infection in January.   She also has questions about allergy medication. She is already on double anti histamines. Never tried singulair. Not using nasal spray, but does saline spray.    Review of Systems  Constitutional: Negative for chills, fatigue and fever.  HENT: Positive for congestion. Negative for dental problem, ear pain, hearing loss and trouble swallowing.   Eyes: Negative for visual disturbance.  Respiratory: Negative for cough, chest tightness and shortness of breath.   Cardiovascular: Negative for chest pain, palpitations and leg swelling.  Gastrointestinal: Negative for abdominal pain, blood in stool, diarrhea and nausea.  Endocrine: Negative for cold intolerance, polydipsia, polyphagia and polyuria.  Genitourinary: Negative for dysuria and hematuria.  Musculoskeletal: Negative for arthralgias.  Skin: Negative for rash.  Neurological: Negative for dizziness and headaches.  Psychiatric/Behavioral: Negative for dysphoric mood and sleep disturbance. The patient is not nervous/anxious.     Allergies Patient is allergic to theophylline, penicillins, pseudoephedrine, and sulfamethoxazole-trimethoprim.  Past Medical History Patient  has a past medical history of ADD (attention deficit disorder), controlled with Adderall, Anemia, Anxiety, Arthritis,  Chicken pox, Depression, Disorder of thyroid, GERD (gastroesophageal reflux disease), Heart murmur, Hyperthyroidism, Menstrual disorder, Migraine, Morbid obesity ([place]), Neurocardiogenic syncope, OSA on CPAP, Pre-diabetes, Swallowing difficulty, and Vitamin D deficiency.  Surgical History Patient  has a past surgical history that includes Urethral dilation (1979); Tilt table study; Wisdom tooth extraction; Dilatation & curettage/hysteroscopy with myosure (N/A, 06/08/2016); and Laparoscopic gastric sleeve resection (N/A, 02/20/2017).  Family History Pateint's family history includes Atrial fibrillation in her maternal grandmother; Cancer in her mother; Colon cancer (age of onset: 38) in her maternal grandfather; Depression in her mother; Diabetes in her father and mother; Heart attack (age of onset: 11) in her father; Heart disease in her father, mother, paternal grandfather, and paternal grandmother; Hyperlipidemia in her father and mother; Hypertension in her father and mother; Kidney disease in her mother; Obesity in her father and mother; Parkinson's disease in her maternal grandmother; Sleep apnea in her mother; Thyroid disease in her mother; Uterine cancer in her mother.  Social History Patient  reports that she quit smoking about 5 years ago. Her smoking use included cigarettes. She started smoking about 28 years ago. She has a 5.00 pack-year smoking history. She has never used smokeless tobacco. She reports current alcohol use. She reports that she does not use drugs.    Objective: Vitals:   09/22/20 1413  BP: 128/78  Pulse: 63  Temp: 97.9 F (36.6 C)  TempSrc: Temporal  SpO2: 99%  Weight: (!) 313 lb (142 kg)  Height: 5\' 5"  (1.651 m)    Body mass index is 52.09 kg/m.  Physical Exam Constitutional:      Appearance: Normal appearance. She is obese.  HENT:     Head:  Normocephalic and atraumatic.  Cardiovascular:     Rate and Rhythm: Normal rate and regular rhythm.     Heart sounds:  Normal heart sounds.  Pulmonary:     Effort: Pulmonary effort is normal.     Breath sounds: Normal breath sounds.  Abdominal:     General: Bowel sounds are normal.     Palpations: Abdomen is soft.  Skin:    General: Skin is warm.     Capillary Refill: Capillary refill takes less than 2 seconds.  Neurological:     General: No focal deficit present.     Mental Status: She is alert and oriented to person, place, and time.        Assessment/plan: 1. Colon cancer screening -discussed I like screening at age 67 years. Varying guidelines. Will depend on her insurance coverage, but referring to GI for hopeful screening.  - Ambulatory referral to Gastroenterology  2. COVID-19  - SARS CoV2 Serology(COVID19) AB(IgG,IgM),Immunoassay  3. Allergy, initial encounter Discussed would only do one antihistamine daily in the AM. Adding on singulair at night and would change to flonase at night as well instead of just nasal saline. can let me know how she is doing at annual f/u.      Return in about 3 weeks (around 10/13/2020) for annual exam with Tyrese Capriotti .   Orma Flaming, MD [place]   09/22/2020

## 2020-09-22 NOTE — Patient Instructions (Signed)
Colonoscopy, Adult A colonoscopy is a procedure to look at the entire large intestine. This procedure is done using a long, thin, flexible tube that has a camera on the end. You may have a colonoscopy:  As a part of normal colorectal screening.  If you have certain symptoms, such as: ? A low number of red blood cells in your blood (anemia). ? Diarrhea that does not go away. ? Pain in your abdomen. ? Blood in your stool. A colonoscopy can help screen for and diagnose medical problems, including:  Tumors.  Extra tissue that grows where mucus forms (polyps).  Inflammation.  Areas of bleeding. Tell your health care provider about:  Any allergies you have.  All medicines you are taking, including vitamins, herbs, eye drops, creams, and over-the-counter medicines.  Any problems you or family members have had with anesthetic medicines.  Any blood disorders you have.  Any surgeries you have had.  Any medical conditions you have.  Any problems you have had with having bowel movements.  Whether you are pregnant or may be pregnant. What are the risks? Generally, this is a safe procedure. However, problems may occur, including:  Bleeding.  Damage to your intestine.  Allergic reactions to medicines given during the procedure.  Infection. This is rare. What happens before the procedure? Eating and drinking restrictions Follow instructions from your health care provider about eating or drinking restrictions, which may include:  A few days before the procedure: ? Follow a low-fiber diet. ? Avoid nuts, seeds, dried fruit, raw fruits, and vegetables.  1-3 days before the procedure: ? Eat only gelatin dessert or ice pops. ? Drink only clear liquids, such as water, clear juice, clear broth or bouillon, black coffee or tea, or clear soft drinks or sports drinks. ? Avoid liquids that contain red or purple dye.  The day of the procedure: ? Do not eat solid foods. You may  continue to drink clear liquids until up to 2 hours before the procedure. ? Do not eat or drink anything starting 2 hours before the procedure, or within the time period that your health care provider recommends. Bowel prep If you were prescribed a bowel prep to take by mouth (orally) to clean out your colon:  Take it as told by your health care provider. Starting the day before your procedure, you will need to drink a large amount of liquid medicine. The liquid will cause you to have many bowel movements of loose stool until your stool becomes almost clear or light green.  If your skin or the opening between the buttocks (anus) gets irritated from diarrhea, you may relieve the irritation using: ? Wipes with medicine in them, such as adult wet wipes with aloe and vitamin E. ? A product to soothe skin, such as petroleum jelly.  If you vomit while drinking the bowel prep: ? Take a break for up to 60 minutes. ? Begin the bowel prep again. ? Call your health care provider if you keep vomiting or you cannot take the bowel prep without vomiting.  To clean out your colon, you may also be given: ? Laxative medicines. These help you have a bowel movement. ? Instructions for enema use. An enema is liquid medicine injected into your rectum. Medicines Ask your health care provider about:  Changing or stopping your regular medicines or supplements. This is especially important if you are taking iron supplements, diabetes medicines, or blood thinners.  Taking medicines such as aspirin and ibuprofen. These medicines  can thin your blood. Do not take these medicines unless your health care provider tells you to take them.  Taking over-the-counter medicines, vitamins, herbs, and supplements. General instructions  Ask your health care provider what steps will be taken to help prevent infection. These may include washing skin with a germ-killing soap.  Plan to have someone take you home from the hospital  or clinic. What happens during the procedure?  An IV will be inserted into one of your veins.  You may be given one or more of the following: ? A medicine to help you relax (sedative). ? A medicine to numb the area (local anesthetic). ? A medicine to make you fall asleep (general anesthetic). This is rarely needed.  You will lie on your side with your knees bent.  The tube will: ? Have oil or gel put on it (be lubricated). ? Be inserted into your anus. ? Be gently eased through all parts of your large intestine.  Air will be sent into your colon to keep it open. This may cause some pressure or cramping.  Images will be taken with the camera and will appear on a screen.  A small tissue sample may be removed to be looked at under a microscope (biopsy). The tissue may be sent to a lab for testing if any signs of problems are found.  If small polyps are found, they may be removed and checked for cancer cells.  When the procedure is finished, the tube will be removed. The procedure may vary among health care providers and hospitals.   What happens after the procedure?  Your blood pressure, heart rate, breathing rate, and blood oxygen level will be monitored until you leave the hospital or clinic.  You may have a small amount of blood in your stool.  You may pass gas and have mild cramping or bloating in your abdomen. This is caused by the air that was used to open your colon during the exam.  Do not drive for 24 hours after the procedure.  It is up to you to get the results of your procedure. Ask your health care provider, or the department that is doing the procedure, when your results will be ready. Summary  A colonoscopy is a procedure to look at the entire large intestine.  Follow instructions from your health care provider about eating and drinking before the procedure.  If you were prescribed an oral bowel prep to clean out your colon, take it as told by your health care  provider.  During the colonoscopy, a flexible tube with a camera on its end is inserted into the anus and then passed into the other parts of the large intestine. This information is not intended to replace advice given to you by your health care provider. Make sure you discuss any questions you have with your health care provider. Document Revised: 12/06/2018 Document Reviewed: 12/06/2018 Elsevier Patient Education  2021 [employer]  

## 2020-09-23 LAB — SARS COV-2 SEROLOGY(COVID-19)AB(IGG,IGM),IMMUNOASSAY
SARS CoV-2 AB IgG: POSITIVE — AB
SARS CoV-2 IgM: NEGATIVE

## 2020-10-01 ENCOUNTER — Encounter: Payer: Self-pay | Admitting: Family Medicine

## 2020-10-06 DIAGNOSIS — M9903 Segmental and somatic dysfunction of lumbar region: Secondary | ICD-10-CM | POA: Diagnosis not present

## 2020-10-06 DIAGNOSIS — M545 Low back pain, unspecified: Secondary | ICD-10-CM | POA: Diagnosis not present

## 2020-10-06 DIAGNOSIS — M9902 Segmental and somatic dysfunction of thoracic region: Secondary | ICD-10-CM | POA: Diagnosis not present

## 2020-10-06 DIAGNOSIS — M546 Pain in thoracic spine: Secondary | ICD-10-CM | POA: Diagnosis not present

## 2020-10-12 DIAGNOSIS — F4323 Adjustment disorder with mixed anxiety and depressed mood: Secondary | ICD-10-CM | POA: Diagnosis not present

## 2020-10-18 DIAGNOSIS — M9902 Segmental and somatic dysfunction of thoracic region: Secondary | ICD-10-CM | POA: Diagnosis not present

## 2020-10-18 DIAGNOSIS — M546 Pain in thoracic spine: Secondary | ICD-10-CM | POA: Diagnosis not present

## 2020-10-18 DIAGNOSIS — M9903 Segmental and somatic dysfunction of lumbar region: Secondary | ICD-10-CM | POA: Diagnosis not present

## 2020-10-18 DIAGNOSIS — M545 Low back pain, unspecified: Secondary | ICD-10-CM | POA: Diagnosis not present

## 2020-10-19 DIAGNOSIS — F4323 Adjustment disorder with mixed anxiety and depressed mood: Secondary | ICD-10-CM | POA: Diagnosis not present

## 2020-10-20 ENCOUNTER — Other Ambulatory Visit: Payer: Self-pay

## 2020-10-20 ENCOUNTER — Ambulatory Visit (INDEPENDENT_AMBULATORY_CARE_PROVIDER_SITE_OTHER): Payer: BC Managed Care – PPO | Admitting: Family Medicine

## 2020-10-20 ENCOUNTER — Encounter: Payer: Self-pay | Admitting: Family Medicine

## 2020-10-20 VITALS — BP 108/73 | HR 59 | Temp 98.0°F | Ht 65.0 in | Wt 314.2 lb

## 2020-10-20 DIAGNOSIS — Z9884 Bariatric surgery status: Secondary | ICD-10-CM | POA: Diagnosis not present

## 2020-10-20 DIAGNOSIS — Z1159 Encounter for screening for other viral diseases: Secondary | ICD-10-CM | POA: Diagnosis not present

## 2020-10-20 DIAGNOSIS — R7303 Prediabetes: Secondary | ICD-10-CM | POA: Diagnosis not present

## 2020-10-20 DIAGNOSIS — Z0000 Encounter for general adult medical examination without abnormal findings: Secondary | ICD-10-CM | POA: Diagnosis not present

## 2020-10-20 DIAGNOSIS — E559 Vitamin D deficiency, unspecified: Secondary | ICD-10-CM | POA: Diagnosis not present

## 2020-10-20 LAB — COMPREHENSIVE METABOLIC PANEL
ALT: 19 U/L (ref 0–35)
AST: 18 U/L (ref 0–37)
Albumin: 4.1 g/dL (ref 3.5–5.2)
Alkaline Phosphatase: 113 U/L (ref 39–117)
BUN: 15 mg/dL (ref 6–23)
CO2: 30 mEq/L (ref 19–32)
Calcium: 9.4 mg/dL (ref 8.4–10.5)
Chloride: 103 mEq/L (ref 96–112)
Creatinine, Ser: 0.81 mg/dL (ref 0.40–1.20)
GFR: 85.89 mL/min (ref >60.00)
Glucose, Bld: 95 mg/dL (ref 70–99)
Potassium: 4.3 mEq/L (ref 3.5–5.1)
Sodium: 141 mEq/L (ref 135–145)
Total Bilirubin: 0.6 mg/dL (ref 0.2–1.2)
Total Protein: 6.9 g/dL (ref 6.0–8.3)

## 2020-10-20 LAB — TSH: TSH: 2.7 u[IU]/mL (ref 0.35–4.50)

## 2020-10-20 LAB — LIPID PANEL
Cholesterol: 159 mg/dL (ref 0–200)
HDL: 55.4 mg/dL (ref >39.00)
LDL Cholesterol: 88 mg/dL (ref 0–99)
NonHDL: 103.13
Total CHOL/HDL Ratio: 3
Triglycerides: 75 mg/dL (ref 0.0–149.0)
VLDL: 15 mg/dL (ref 0.0–40.0)

## 2020-10-20 LAB — CBC WITH DIFFERENTIAL/PLATELET
Basophils Absolute: 0.1 10*3/uL (ref 0.0–0.1)
Basophils Relative: 0.9 % (ref 0.0–3.0)
Eosinophils Absolute: 0.4 10*3/uL (ref 0.0–0.7)
Eosinophils Relative: 4.6 % (ref 0.0–5.0)
HCT: 37.4 % (ref 36.0–46.0)
Hemoglobin: 12.7 g/dL (ref 12.0–15.0)
Lymphocytes Relative: 23.5 % (ref 12.0–46.0)
Lymphs Abs: 1.8 10*3/uL (ref 0.7–4.0)
MCHC: 33.8 g/dL (ref 30.0–36.0)
MCV: 93 fl (ref 78.0–100.0)
Monocytes Absolute: 0.5 10*3/uL (ref 0.1–1.0)
Monocytes Relative: 7.1 % (ref 3.0–12.0)
Neutro Abs: 4.9 10*3/uL (ref 1.4–7.7)
Neutrophils Relative %: 63.9 % (ref 43.0–77.0)
Platelets: 249 10*3/uL (ref 150.0–400.0)
RBC: 4.03 Mil/uL (ref 3.87–5.11)
RDW: 14.1 % (ref 11.5–15.5)
WBC: 7.7 10*3/uL (ref 4.0–10.5)

## 2020-10-20 LAB — VITAMIN D 25 HYDROXY (VIT D DEFICIENCY, FRACTURES): VITD: 49.51 ng/mL (ref 30.00–100.00)

## 2020-10-20 LAB — VITAMIN B12: Vitamin B-12: 1196 pg/mL — ABNORMAL HIGH (ref 211–911)

## 2020-10-20 LAB — HEMOGLOBIN A1C: Hgb A1c MFr Bld: 5.6 % (ref 4.6–6.5)

## 2020-10-20 NOTE — Patient Instructions (Addendum)
-labs today -UTD on HM I WILL MISS YOU!!! aw  Preventive Care 30-48 Years Old, Female Preventive care refers to lifestyle choices and visits with your health care provider that can promote health and wellness. This includes:  A yearly physical exam. This is also called an annual wellness visit.  Regular dental and eye exams.  Immunizations.  Screening for certain conditions.  Healthy lifestyle choices, such as: ? Eating a healthy diet. ? Getting regular exercise. ? Not using drugs or products that contain nicotine and tobacco. ? Limiting alcohol use. What can I expect for my preventive care visit? Physical exam Your health care provider will check your:  Height and weight. These may be used to calculate your BMI (body mass index). BMI is a measurement that tells if you are at a healthy weight.  Heart rate and blood pressure.  Body temperature.  Skin for abnormal spots. Counseling Your health care provider may ask you questions about your:  Past medical problems.  Family's medical history.  Alcohol, tobacco, and drug use.  Emotional well-being.  Home life and relationship well-being.  Sexual activity.  Diet, exercise, and sleep habits.  Work and work Statistician.  Access to firearms.  Method of birth control.  Menstrual cycle.  Pregnancy history. What immunizations do I need? Vaccines are usually given at various ages, according to a schedule. Your health care provider will recommend vaccines for you based on your age, medical history, and lifestyle or other factors, such as travel or where you work.   What tests do I need? Blood tests  Lipid and cholesterol levels. These may be checked every 5 years, or more often if you are over 34 years old.  Hepatitis C test.  Hepatitis B test. Screening  Lung cancer screening. You may have this screening every year starting at age 33 if you have a 30-pack-year history of smoking and currently smoke or have  quit within the past 15 years.  Colorectal cancer screening. ? All adults should have this screening starting at age 81 and continuing until age 68. ? Your health care provider may recommend screening at age 83 if you are at increased risk. ? You will have tests every 1-10 years, depending on your results and the type of screening test.  Diabetes screening. ? This is done by checking your blood sugar (glucose) after you have not eaten for a while (fasting). ? You may have this done every 1-3 years.  Mammogram. ? This may be done every 1-2 years. ? Talk with your health care provider about when you should start having regular mammograms. This may depend on whether you have a family history of breast cancer.  BRCA-related cancer screening. This may be done if you have a family history of breast, ovarian, tubal, or peritoneal cancers.  Pelvic exam and Pap test. ? This may be done every 3 years starting at age 58. ? Starting at age 73, this may be done every 5 years if you have a Pap test in combination with an HPV test. Other tests  STD (sexually transmitted disease) testing, if you are at risk.  Bone density scan. This is done to screen for osteoporosis. You may have this scan if you are at high risk for osteoporosis. Talk with your health care provider about your test results, treatment options, and if necessary, the need for more tests. Follow these instructions at home: Eating and drinking  Eat a diet that includes fresh fruits and vegetables, whole grains, lean  protein, and low-fat dairy products.  Take vitamin and mineral supplements as recommended by your health care provider.  Do not drink alcohol if: ? Your health care provider tells you not to drink. ? You are pregnant, may be pregnant, or are planning to become pregnant.  If you drink alcohol: ? Limit how much you have to 0-1 drink a day. ? Be aware of how much alcohol is in your drink. In the [place] one drink equals one  12 oz bottle of beer (355 mL), one 5 oz glass of wine (148 mL), or one 1 oz glass of hard liquor (44 mL).   Lifestyle  Take daily care of your teeth and gums. Brush your teeth every morning and night with fluoride toothpaste. Floss one time each day.  Stay active. Exercise for at least 30 minutes 5 or more days each week.  Do not use any products that contain nicotine or tobacco, such as cigarettes, e-cigarettes, and chewing tobacco. If you need help quitting, ask your health care provider.  Do not use drugs.  If you are sexually active, practice safe sex. Use a condom or other form of protection to prevent STIs (sexually transmitted infections).  If you do not wish to become pregnant, use a form of birth control. If you plan to become pregnant, see your health care provider for a prepregnancy visit.  If told by your health care provider, take low-dose aspirin daily starting at age 97.  Find healthy ways to cope with stress, such as: ? Meditation, yoga, or listening to music. ? Journaling. ? Talking to a trusted person. ? Spending time with friends and family. Safety  Always wear your seat belt while driving or riding in a vehicle.  Do not drive: ? If you have been drinking alcohol. Do not ride with someone who has been drinking. ? When you are tired or distracted. ? While texting.  Wear a helmet and other protective equipment during sports activities.  If you have firearms in your house, make sure you follow all gun safety procedures. What's next?  Visit your health care provider once a year for an annual wellness visit.  Ask your health care provider how often you should have your eyes and teeth checked.  Stay up to date on all vaccines. This information is not intended to replace advice given to you by your health care provider. Make sure you discuss any questions you have with your health care provider. Document Revised: 02/17/2020 Document Reviewed:  01/24/2018 Elsevier Patient Education  2021 [employer].

## 2020-10-20 NOTE — Progress Notes (Signed)
Patient: Yvette White MRN: [id number] DOB: 26-Sep-1970 PCP: Orma Flaming, MD     Subjective:  Chief Complaint  Patient presents with  . Annual Exam  . Pre DM  . Vitamin D Deficiency    HPI: The patient is a 50 y.o. female who presents today for annual exam. She denies any changes to past medical history. There have been no recent hospitalizations. They are following a well balanced diet and exercise plan. She is followed by a nutritionist, and boxes once weekly. Weight has been fluctuating a bit.   Hx of vitamin D Deficiency: will test today. On daily replacement at 5000IU/day.   Hx of prediabetes. a1c has been well controlled. Is off chronic steroids for her eye and has been off x 2 months.   Hx of bariatric surgery and has fatigue -checking bariatric labs.   Referral for cscope done and she has appointment for this already.   Immunization History  Administered Date(s) Administered  . Influenza,inj,Quad PF,6+ Mos 02/21/2017, 05/07/2018, 04/14/2019  . Influenza-Unspecified 02/21/2017, 03/15/2017, 05/03/2018, 04/14/2019  . PFIZER(Purple Top)SARS-COV-2 Vaccination 06/18/2019, 07/09/2019  . Pneumococcal Conjugate-13 08/08/2019   Colonoscopy: due for this and appointment pending.  Mammogram: utd. [facility] Dr[facility] Corinna Capra: 2021 Pap smear: 2021   Review of Systems  Constitutional: Positive for fatigue. Negative for appetite change, chills and fever.  HENT: Negative for dental problem, ear pain, hearing loss and trouble swallowing.   Eyes: Negative for visual disturbance.  Respiratory: Negative for cough, chest tightness and shortness of breath.   Cardiovascular: Negative for chest pain, palpitations and leg swelling.  Gastrointestinal: Negative for abdominal pain, blood in stool, diarrhea and nausea.  Endocrine: Negative for cold intolerance, polydipsia, polyphagia and polyuria.  Genitourinary: Negative for dysuria and hematuria.  Musculoskeletal: Negative  for arthralgias.  Skin: Negative for rash.  Neurological: Negative for dizziness and headaches.  Psychiatric/Behavioral: Negative for dysphoric mood and sleep disturbance. The patient is not nervous/anxious.     Allergies Patient is allergic to theophylline, penicillins, pseudoephedrine, and sulfamethoxazole-trimethoprim.  Past Medical History Patient  has a past medical history of ADD (attention deficit disorder), controlled with Adderall, Anemia, Anxiety, Arthritis, Chicken pox, Depression, Disorder of thyroid, GERD (gastroesophageal reflux disease), Heart murmur, Hyperthyroidism, Menstrual disorder, Migraine, Morbid obesity ([place]), Neurocardiogenic syncope, OSA on CPAP, Pre-diabetes, Swallowing difficulty, and Vitamin D deficiency.  Surgical History Patient  has a past surgical history that includes Urethral dilation (1979); Tilt table study; Wisdom tooth extraction; Dilatation & curettage/hysteroscopy with myosure (N/A, 06/08/2016); and Laparoscopic gastric sleeve resection (N/A, 02/20/2017).  Family History Pateint's family history includes Atrial fibrillation in her maternal grandmother; Cancer in her mother; Colon cancer (age of onset: 66) in her maternal grandfather; Depression in her mother; Diabetes in her father and mother; Heart attack (age of onset: 84) in her father; Heart disease in her father, mother, paternal grandfather, and paternal grandmother; Hyperlipidemia in her father and mother; Hypertension in her father and mother; Kidney disease in her mother; Obesity in her father and mother; Parkinson's disease in her maternal grandmother; Sleep apnea in her mother; Thyroid disease in her mother; Uterine cancer in her mother.  Social History Patient  reports that she quit smoking about 6 years ago. Her smoking use included cigarettes. She started smoking about 28 years ago. She has a 5.00 pack-year smoking history. She has never used smokeless tobacco. She reports current alcohol use.  She reports that she does not use drugs.    Objective: Vitals:   10/20/20  0933  BP: 108/73  Pulse: (!) 59  Temp: 98 F (36.7 C)  TempSrc: Temporal  SpO2: 98%  Weight: (!) 314 lb 3.2 oz (142.5 kg)  Height: 5\' 5"  (1.651 m)    Body mass index is 52.29 kg/m.  Physical Exam Vitals reviewed.  Constitutional:      Appearance: Normal appearance. She is well-developed. She is obese.  HENT:     Head: Normocephalic and atraumatic.     Right Ear: Tympanic membrane, ear canal and external ear normal.     Left Ear: Tympanic membrane, ear canal and external ear normal.     Nose: Nose normal.     Mouth/Throat:     Mouth: Mucous membranes are moist.  Eyes:     Extraocular Movements: Extraocular movements intact.     Conjunctiva/sclera: Conjunctivae normal.     Pupils: Pupils are equal, round, and reactive to light.  Neck:     Thyroid: No thyromegaly.     Vascular: No carotid bruit.  Cardiovascular:     Rate and Rhythm: Normal rate and regular rhythm.     Pulses: Normal pulses.     Heart sounds: Normal heart sounds. No murmur heard.   Pulmonary:     Effort: Pulmonary effort is normal.     Breath sounds: Normal breath sounds.  Abdominal:     General: Bowel sounds are normal. There is no distension.     Palpations: Abdomen is soft.     Tenderness: There is no abdominal tenderness.  Musculoskeletal:     Cervical back: Normal range of motion and neck supple.  Lymphadenopathy:     Cervical: No cervical adenopathy.  Skin:    General: Skin is warm and dry.     Capillary Refill: Capillary refill takes less than 2 seconds.     Findings: No rash.  Neurological:     General: No focal deficit present.     Mental Status: She is alert and oriented to person, place, and time.     Cranial Nerves: No cranial nerve deficit.     Coordination: Coordination normal.     Deep Tendon Reflexes: Reflexes normal.  Psychiatric:        Mood and Affect: Mood normal.        Behavior: Behavior normal.     [place] Office Visit from 10/20/2020 in [place]  PHQ-2 Total Score 0          Assessment/plan: 1. Annual physical exam Routine fasting labs today. HM reveiwed and UTD. Need records for mammo. Already has cscope scheduled. Working with nutritionist/therapist on diet. Struggle for her. She knows she needs to exercise and trying to find something she enjoys. Overall doing well. ? If fatigue from long covid vs. Being off steroids. Checking labs, encouraged exercise. Fu in one year or as needed.  Patient counseling [x]    Nutrition: Stressed importance of moderation in sodium/caffeine intake, saturated fat and cholesterol, caloric balance, sufficient intake of fresh fruits, vegetables, fiber, calcium, iron, and 1 mg of folate supplement per day (for females capable of pregnancy).  [x]    Stressed the importance of regular exercise.   []    Substance Abuse: Discussed cessation/primary prevention of tobacco, alcohol, or other drug use; driving or other dangerous activities under the influence; availability of treatment for abuse.   [x]    Injury prevention: Discussed safety belts, safety helmets, smoke detector, smoking near bedding or upholstery.   [x]    Sexuality: Discussed sexually transmitted diseases, partner selection,  use of condoms, avoidance of unintended pregnancy  and contraceptive alternatives.  [x]    Dental health: Discussed importance of regular tooth brushing, flossing, and dental visits.  [x]    Health maintenance and immunizations reviewed. Please refer to Health maintenance section.    - CBC with Differential/Platelet - Comprehensive metabolic panel - Lipid panel - TSH  2. Vitamin D deficiency On daily replacement. Will see what she is running, was to goal on last check.  - VITAMIN D 25 Hydroxy (Vit-D Deficiency, Fractures)  3. Pre-diabetes  - Hemoglobin A1c  4. Encounter for hepatitis C screening test for low risk patient  - Hepatitis C  antibody  5. Hx of bariatric surgery  - Vitamin B12 - Iron, TIBC and Ferritin Panel    Return in about 1 year (around 10/20/2021) for annual and TOC with dr. Jerline Pain, .     Orma Flaming, MD [place]  10/20/2020

## 2020-10-21 LAB — HEPATITIS C ANTIBODY
Hepatitis C Ab: NONREACTIVE
SIGNAL TO CUT-OFF: 0 (ref <1.00)

## 2020-10-21 LAB — IRON,TIBC AND FERRITIN PANEL
%SAT: 23 % (calc) (ref 16–45)
Ferritin: 32 ng/mL (ref 16–232)
Iron: 86 ug/dL (ref 40–190)
TIBC: 376 mcg/dL (calc) (ref 250–450)

## 2020-10-22 ENCOUNTER — Encounter: Payer: Self-pay | Admitting: Family Medicine

## 2020-10-22 NOTE — Telephone Encounter (Signed)
Your TSH was normal. I responded on your B12 on the other message you left.

## 2020-10-26 DIAGNOSIS — F4323 Adjustment disorder with mixed anxiety and depressed mood: Secondary | ICD-10-CM | POA: Diagnosis not present

## 2020-11-02 ENCOUNTER — Ambulatory Visit: Payer: Self-pay

## 2020-11-02 ENCOUNTER — Other Ambulatory Visit: Payer: Self-pay

## 2020-11-02 ENCOUNTER — Ambulatory Visit: Payer: BC Managed Care – PPO | Admitting: Family Medicine

## 2020-11-02 DIAGNOSIS — M79642 Pain in left hand: Secondary | ICD-10-CM

## 2020-11-02 DIAGNOSIS — F4323 Adjustment disorder with mixed anxiety and depressed mood: Secondary | ICD-10-CM | POA: Diagnosis not present

## 2020-11-02 DIAGNOSIS — Z20822 Contact with and (suspected) exposure to covid-19: Secondary | ICD-10-CM | POA: Diagnosis not present

## 2020-11-02 MED ORDER — DEXAMETHASONE SODIUM PHOSPHATE 4 MG/ML IJ SOLN: INTRAMUSCULAR | 6 refills | Status: DC

## 2020-11-02 NOTE — Progress Notes (Signed)
Office Visit Note   Patient: Yvette White           Date of Birth: [birth year]           MRN: [id number] Visit Date: 11/02/2020 Requested by: Orma Flaming, [address] [place],  [place] [id number] PCP: Orma Flaming, MD  Subjective: Chief Complaint  Patient presents with  . Left Thumb - Pain    Pain around the [facility] joint and into the wrist x 6 months. Works as a Geophysicist/field seismologist at [employer]. Has been getting dry needling treatments. Does help. Sleeps with a thumb spica splint. Right hand dominant.     HPI: She is here with left thumb pain.  Symptoms started 5 or 6 months ago, no definite injury.  She works as a Geophysicist/field seismologist.  She is right-hand dominant.  Pain seems to be near the MCP joint.  Occasional catching symptoms, no locking.  She tried sleeping with a wrist brace but that has not made much difference.  She has been getting dry needling treatments with only temporary relief.              ROS:   All other systems were reviewed and are negative.  Objective: Vital Signs: There were no vitals taken for this visit.  Physical Exam:  General:  Alert and oriented, in no acute distress. Pulm:  Breathing unlabored. Psy:  Normal mood, congruent affect. Skin: No swelling or bruising Left hand: She has tenderness to palpation around the MCP joint of the thumb.  There is mild crepitus with axial load of the CMC joint, but the joint itself does not hurt.  No significant laxity at the MCP joint.  No triggering at the A1 pulley.  Negative Tinel's at the carpal tunnel, negative Phalen's test.  Imaging: XR Hand Complete Left  Result Date: 11/02/2020 X-rays of the left hand reveal a bony fragment near the thumb [facility] joint with radial subluxation of the first metacarpal.  The MCP joint has mild joint space narrowing.  No other bony abnormality seen.   Assessment & Plan: 1.  Left thumb pain, etiology uncertain.  Could be tendinopathy of the flexor tendons versus early DJD at the  MCP joint. -We will try iontophoresis.  If symptoms persist, could contemplate injection.     Procedures: No procedures performed        PMFS History: Patient Active Problem List   Diagnosis Date Noted  . Situational anxiety 12/24/2018  . 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 Migraine without aura and without status migrainosus, not intractable 12/31/2017  . Chronic thoracic back pain 09/27/2017  . Low back pain 09/27/2017  . Former smoker, quit in 2016 07/29/2017  . Bladder spasms 07/29/2017  . History of abnormal mammogram 07/29/2017  . Thyroid condition, history of low TSH that normalized with monitoring 07/29/2017  . ADD (attention deficit disorder)   . OSA on CPAP   . Vitamin D deficiency 05/02/2017  . Pre-diabetes 05/02/2017  . 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 Bipolar 1 disorder ([place]), followed by Psych, controlled with Lamictal 01/24/2017  . Morbid obesity ([place]), s/p gastric sleeve, at time of surgery - weight 382, BMI 61 07/13/2016  . Abnormal menses 03/06/2016  . GERD (gastroesophageal reflux disease), on Prilosec 03/06/2016  . History of neurocardiogenic syncope, no episodes since 2017 03/06/2016  . Pharyngoesophageal dysphagia 03/06/2016  . Seasonal allergies, controlled with Claritin 08/25/2013   Past Medical History:  Diagnosis Date  .  ADD (attention deficit disorder), controlled with Adderall   . Anemia   . Anxiety   . Arthritis   . Chicken pox   . Depression   . Disorder of thyroid   . GERD (gastroesophageal reflux disease)   . Heart murmur   . Hyperthyroidism   . Menstrual disorder    uterine fibriods, polyps,cysts  . Migraine   . Morbid obesity ([place])   . Neurocardiogenic syncope   . OSA on CPAP   . Pre-diabetes   . Swallowing difficulty   . Vitamin D deficiency     Family History  Problem Relation Age of Onset  . Diabetes Mother   . Heart disease Mother   . Uterine cancer Mother   .  Hypertension Mother   . Hyperlipidemia Mother   . Kidney disease Mother   . Thyroid disease Mother   . Cancer Mother   . Depression Mother   . Sleep apnea Mother   . Obesity Mother   . Heart attack Father 5  . Diabetes Father   . Heart disease Father   . Hypertension Father   . Hyperlipidemia Father   . Obesity Father   . Atrial fibrillation Maternal Grandmother   . Parkinson's disease Maternal Grandmother   . Colon cancer Maternal Grandfather 13  . Heart disease Paternal Grandmother   . Heart disease Paternal Grandfather     Past Surgical History:  Procedure Laterality Date  . DILATATION & CURETTAGE/HYSTEROSCOPY WITH MYOSURE N/A 06/08/2016   Procedure: DILATATION & CURETTAGE/HYSTEROSCOPY;  Surgeon: Louretta Shorten, MD;  Location: [facility];  Service: Gynecology;  Laterality: N/A;  . LAPAROSCOPIC GASTRIC SLEEVE RESECTION N/A 02/20/2017   Procedure: LAPAROSCOPIC GASTRIC SLEEVE RESECTION WITH UPPER ENDOSCOPY;  Surgeon: Johnathan Hausen, MD;  Location: [facility];  Service: General;  Laterality: N/A;  . TILT TABLE STUDY    . URETHRAL DILATION  1979  . WISDOM TOOTH EXTRACTION     Social History   Occupational History  . Occupation: Freight forwarder, Geophysicist/field seismologist, Instructor  Tobacco Use  . Smoking status: Former Smoker    Packs/day: 0.25    Years: 20.00    Pack years: 5.00    Types: Cigarettes    Start date: 51    Quit date: 09/30/2014    Years since quitting: 6.0  . Smokeless tobacco: Never Used  Substance and Sexual Activity  . Alcohol use: Yes    Comment: occ  . Drug use: No  . Sexual activity: Not on file

## 2020-11-03 DIAGNOSIS — F4323 Adjustment disorder with mixed anxiety and depressed mood: Secondary | ICD-10-CM | POA: Diagnosis not present

## 2020-11-08 DIAGNOSIS — M9903 Segmental and somatic dysfunction of lumbar region: Secondary | ICD-10-CM | POA: Diagnosis not present

## 2020-11-08 DIAGNOSIS — M546 Pain in thoracic spine: Secondary | ICD-10-CM | POA: Diagnosis not present

## 2020-11-08 DIAGNOSIS — M9902 Segmental and somatic dysfunction of thoracic region: Secondary | ICD-10-CM | POA: Diagnosis not present

## 2020-11-08 DIAGNOSIS — M545 Low back pain, unspecified: Secondary | ICD-10-CM | POA: Diagnosis not present

## 2020-11-09 DIAGNOSIS — F4323 Adjustment disorder with mixed anxiety and depressed mood: Secondary | ICD-10-CM | POA: Diagnosis not present

## 2020-11-16 DIAGNOSIS — F4323 Adjustment disorder with mixed anxiety and depressed mood: Secondary | ICD-10-CM | POA: Diagnosis not present

## 2020-11-19 DIAGNOSIS — R300 Dysuria: Secondary | ICD-10-CM | POA: Diagnosis not present

## 2020-11-19 DIAGNOSIS — N390 Urinary tract infection, site not specified: Secondary | ICD-10-CM | POA: Diagnosis not present

## 2020-11-24 ENCOUNTER — Telehealth: Payer: Self-pay | Admitting: *Deleted

## 2020-11-24 NOTE — Telephone Encounter (Signed)
Dr Henrene Pastor,  This pt has been referred to you for a screen colon- no GI hx She has a BMI of 52.29 as of her last PCP visit 10-20-2020 She has a hx of GERD, dysphagia, gastric sleeve 2018, OSA on CPAP  Do you want her to have an OV- or direct at [facility]  Please advise-   thanks  Lelan Pons  PV

## 2020-11-24 NOTE — Telephone Encounter (Signed)
CALLED PT- LMTRC TO MAKE AN OV WITH  DR PERRY- PV 7-15  AND 7-29  COLON HAS BEEN CANCELLED  PER TE -

## 2020-11-25 NOTE — Telephone Encounter (Signed)
Patient returned call. Attempted to reschedule but patient states she will call back at a later time

## 2020-11-30 DIAGNOSIS — F4323 Adjustment disorder with mixed anxiety and depressed mood: Secondary | ICD-10-CM | POA: Diagnosis not present

## 2020-12-01 DIAGNOSIS — M546 Pain in thoracic spine: Secondary | ICD-10-CM | POA: Diagnosis not present

## 2020-12-01 DIAGNOSIS — M9903 Segmental and somatic dysfunction of lumbar region: Secondary | ICD-10-CM | POA: Diagnosis not present

## 2020-12-01 DIAGNOSIS — M9902 Segmental and somatic dysfunction of thoracic region: Secondary | ICD-10-CM | POA: Diagnosis not present

## 2020-12-01 DIAGNOSIS — M545 Low back pain, unspecified: Secondary | ICD-10-CM | POA: Diagnosis not present

## 2020-12-06 DIAGNOSIS — M546 Pain in thoracic spine: Secondary | ICD-10-CM | POA: Diagnosis not present

## 2020-12-06 DIAGNOSIS — M9904 Segmental and somatic dysfunction of sacral region: Secondary | ICD-10-CM | POA: Diagnosis not present

## 2020-12-06 DIAGNOSIS — M9901 Segmental and somatic dysfunction of cervical region: Secondary | ICD-10-CM | POA: Diagnosis not present

## 2020-12-06 DIAGNOSIS — M9902 Segmental and somatic dysfunction of thoracic region: Secondary | ICD-10-CM | POA: Diagnosis not present

## 2020-12-07 DIAGNOSIS — F4323 Adjustment disorder with mixed anxiety and depressed mood: Secondary | ICD-10-CM | POA: Diagnosis not present

## 2020-12-10 DIAGNOSIS — R5382 Chronic fatigue, unspecified: Secondary | ICD-10-CM | POA: Diagnosis not present

## 2020-12-10 DIAGNOSIS — H0510 Unspecified chronic inflammatory disorders of orbit: Secondary | ICD-10-CM | POA: Diagnosis not present

## 2020-12-10 DIAGNOSIS — Z79899 Other long term (current) drug therapy: Secondary | ICD-10-CM | POA: Diagnosis not present

## 2020-12-14 DIAGNOSIS — F4323 Adjustment disorder with mixed anxiety and depressed mood: Secondary | ICD-10-CM | POA: Diagnosis not present

## 2020-12-15 ENCOUNTER — Ambulatory Visit: Payer: BC Managed Care – PPO | Admitting: Physician Assistant

## 2020-12-17 ENCOUNTER — Other Ambulatory Visit: Payer: Self-pay | Admitting: Family Medicine

## 2020-12-17 ENCOUNTER — Other Ambulatory Visit: Payer: Self-pay

## 2020-12-17 DIAGNOSIS — K219 Gastro-esophageal reflux disease without esophagitis: Secondary | ICD-10-CM

## 2020-12-19 ENCOUNTER — Encounter: Payer: Self-pay | Admitting: Family Medicine

## 2020-12-21 DIAGNOSIS — F4323 Adjustment disorder with mixed anxiety and depressed mood: Secondary | ICD-10-CM | POA: Diagnosis not present

## 2020-12-22 DIAGNOSIS — M546 Pain in thoracic spine: Secondary | ICD-10-CM | POA: Diagnosis not present

## 2020-12-22 DIAGNOSIS — M9902 Segmental and somatic dysfunction of thoracic region: Secondary | ICD-10-CM | POA: Diagnosis not present

## 2020-12-22 DIAGNOSIS — M9904 Segmental and somatic dysfunction of sacral region: Secondary | ICD-10-CM | POA: Diagnosis not present

## 2020-12-22 DIAGNOSIS — M9901 Segmental and somatic dysfunction of cervical region: Secondary | ICD-10-CM | POA: Diagnosis not present

## 2020-12-23 ENCOUNTER — Other Ambulatory Visit: Payer: Self-pay

## 2020-12-23 ENCOUNTER — Encounter: Payer: Self-pay | Admitting: Physician Assistant

## 2020-12-23 ENCOUNTER — Ambulatory Visit (INDEPENDENT_AMBULATORY_CARE_PROVIDER_SITE_OTHER): Payer: BC Managed Care – PPO | Admitting: Physician Assistant

## 2020-12-23 VITALS — BP 109/64 | HR 56 | Temp 97.1°F | Ht 65.0 in | Wt 322.0 lb

## 2020-12-23 DIAGNOSIS — R748 Abnormal levels of other serum enzymes: Secondary | ICD-10-CM | POA: Diagnosis not present

## 2020-12-23 DIAGNOSIS — G4733 Obstructive sleep apnea (adult) (pediatric): Secondary | ICD-10-CM | POA: Diagnosis not present

## 2020-12-23 DIAGNOSIS — R5383 Other fatigue: Secondary | ICD-10-CM

## 2020-12-23 DIAGNOSIS — G43009 Migraine without aura, not intractable, without status migrainosus: Secondary | ICD-10-CM | POA: Diagnosis not present

## 2020-12-23 NOTE — Patient Instructions (Signed)
Good to meet you today! Referral placed for repeat sleep study. I am discussing medication options with our clinical pharmacist for migraine prevention and will get back with you in the next week about this. Plan to recheck B12 at your visit with Dr. Jonni Sanger in October.

## 2020-12-23 NOTE — Progress Notes (Signed)
Acute Office Visit  Subjective:    Patient ID: Yvette White, female    DOB: 1970/07/04, 50 y.o.   MRN: [id number]  Chief Complaint  Patient presents with   Headache    She says that they have increased in last month. Increased after having Covid.   Fatigue    HPI Patient is in today for headaches and fatigue. Fatigue has come on in the last year per patient. Worse since having COVID-19 in January 2022.   She had labs done 10-20-20 without any findings there.  Hx of migraines as a teenager. Improved in her 65s. Rarely any in the last 10 years. This month she is getting a headache about every other day. She has hx of autoimmune issue idiopathic orbital inflammatory syndrome. Before it used to be just around right eye, now it is across the front of her forehead as well. Also has associated nausea and some dizziness. She has been taking Ibuprofen and /or Tylenol. She was treated as a teenager with preventive medications and even had some injections at one point, but cannot remember the names.   She has not had a recheck sleep study since she had her gastric sleeve done 02-20-17. Hasn't used CPAP since 2018.   She is also concerned about cause of her B12 being elevated the last two checks. She does not take any supplements and has never had injections.   Past Medical History:  Diagnosis Date   ADD (attention deficit disorder), controlled with Adderall    Anemia    Anxiety    Arthritis    Chicken pox    Depression    Disorder of thyroid    GERD (gastroesophageal reflux disease)    Heart murmur    Hyperthyroidism    Menstrual disorder    uterine fibriods, polyps,cysts   Migraine    Morbid obesity ([place])    Neurocardiogenic syncope    OSA on CPAP    Pre-diabetes    Swallowing difficulty    Vitamin D deficiency     Past Surgical History:  Procedure Laterality Date   DILATATION & CURETTAGE/HYSTEROSCOPY WITH MYOSURE N/A 06/08/2016   Procedure: DILATATION &  CURETTAGE/HYSTEROSCOPY;  Surgeon: Louretta Shorten, MD;  Location: [facility];  Service: Gynecology;  Laterality: N/A;   LAPAROSCOPIC GASTRIC SLEEVE RESECTION N/A 02/20/2017   Procedure: LAPAROSCOPIC GASTRIC SLEEVE RESECTION WITH UPPER ENDOSCOPY;  Surgeon: Johnathan Hausen, MD;  Location: [facility];  Service: General;  Laterality: N/A;   TILT TABLE STUDY     URETHRAL DILATION  1979   WISDOM TOOTH EXTRACTION      Family History  Problem Relation Age of Onset   Diabetes Mother    Heart disease Mother    Uterine cancer Mother    Hypertension Mother    Hyperlipidemia Mother    Kidney disease Mother    Thyroid disease Mother    Cancer Mother    Depression Mother    Sleep apnea Mother    Obesity Mother    Heart attack Father 13   Diabetes Father    Heart disease Father    Hypertension Father    Hyperlipidemia Father    Obesity Father    Atrial fibrillation Maternal Grandmother    Parkinson's disease Maternal Grandmother    Colon cancer Maternal Grandfather 39   Heart disease Paternal Grandmother    Heart disease Paternal Grandfather     Social History   Socioeconomic History   Marital status: Single    Spouse name:  Not on file   Number of children: 0   Years of education: BA   Highest education level: Not on file  Occupational History   Occupation: Freight forwarder, massage therapist, Instructor  Tobacco Use   Smoking status: Former    Packs/day: 0.25    Years: 20.00    Pack years: 5.00    Types: Cigarettes    Start date: 48    Quit date: 09/30/2014    Years since quitting: 6.2   Smokeless tobacco: Never  Substance and Sexual Activity   Alcohol use: Yes    Comment: occ   Drug use: No   Sexual activity: Not on file  Other Topics Concern   Not on file  Social History Narrative   Drinks 1-2 caffeine drinks a day. 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    Social Determinants of Health   Financial Resource Strain: Not on file  Food Insecurity:  Not on file  Transportation Needs: Not on file  Physical Activity: Not on file  Stress: Not on file  Social Connections: Not on file  Intimate Partner Violence: Not on file    Outpatient Medications Prior to Visit  Medication Sig Dispense Refill   acetaminophen (TYLENOL) 500 MG tablet Take 1,000 mg by mouth every 6 (six) hours as needed (for pain.).     Amphet-Dextroamphet 3-Bead ER (MYDAYIS) 50 MG CP24 Take by mouth.     buPROPion (WELLBUTRIN XL) 150 MG 24 hr tablet Take 450 mg by mouth daily.     Calcium Carbonate Antacid (ANTACID PO) Take 500 mg by mouth 3 (three) times daily.     Cholecalciferol (VITAMIN D) 125 MCG (5000 UT) CAPS      clonazePAM (KLONOPIN) 0.5 MG tablet Take 1 tablet (0.5 mg total) by mouth 2 (two) times daily as needed for anxiety. 30 tablet 0   folic acid (FOLVITE) 1 MG tablet Take 1 mg by mouth daily.     lamoTRIgine (LAMICTAL) 200 MG tablet Take 200 mg by mouth every evening.      loratadine (CLARITIN) 10 MG tablet Take 10 mg by mouth every evening.     LORazepam (ATIVAN) 1 MG tablet Take 1-2 mg by mouth 2 (two) times daily as needed.     montelukast (SINGULAIR) 10 MG tablet Take 1 tablet (10 mg total) by mouth at bedtime. 90 tablet 1   Multiple Vitamins-Minerals (CELEBRATE MULTI-COMPLETE 18 PO) Take by mouth.     pantoprazole (PROTONIX) 40 MG tablet TAKE 1 TABLET(40 MG) BY MOUTH DAILY 90 tablet 0   pantoprazole (PROTONIX) 40 MG tablet Take by mouth.     traZODone (DESYREL) 50 MG tablet Take 50-150 mg by mouth at bedtime.     Adalimumab 40 MG/0.4ML PSKT Inject into the skin.     Methotrexate Sodium (METHOTREXATE, PF,) 50 MG/2ML injection once a week.  2   dexamethasone (DECADRON) 4 MG/ML injection Apply as needed for iontophoresis 30 mL 6   No facility-administered medications prior to visit.    Allergies  Allergen Reactions   Theophylline     Other reaction(s): Other (See Comments) Hyperactive  "Slo phylinne"   Penicillins Rash    Has patient had a  PCN reaction causing immediate rash, facial/tongue/throat swelling, SOB or lightheadedness with hypotension:unsure Has patient had a PCN reaction causing severe rash involving mucus membranes or skin necrosis:unsure Has patient had a PCN reaction that required hospitalization:No Has patient had a PCN reaction occurring within the last  10 years: No If all of the above answers are "NO", then may proceed with Cephalosporin use.    Pseudoephedrine Rash   Sulfamethoxazole-Trimethoprim Rash    Review of Systems REFER TO HPI FOR PERTINENT POSITIVES AND NEGATIVES     Objective:    Physical Exam Vitals and nursing note reviewed.  Constitutional:      Appearance: Normal appearance. She is obese. She is not toxic-appearing.  HENT:     Head: Normocephalic and atraumatic.     Right Ear: Tympanic membrane, ear canal and external ear normal.     Left Ear: Tympanic membrane, ear canal and external ear normal.     Nose: Nose normal.     Mouth/Throat:     Mouth: Mucous membranes are moist.  Eyes:     Extraocular Movements: Extraocular movements intact.     Conjunctiva/sclera: Conjunctivae normal.     Pupils: Pupils are equal, round, and reactive to light.  Cardiovascular:     Rate and Rhythm: Normal rate and regular rhythm.     Pulses: Normal pulses.     Heart sounds: Normal heart sounds.  Pulmonary:     Effort: Pulmonary effort is normal.     Breath sounds: Normal breath sounds.  Abdominal:     General: Abdomen is flat. Bowel sounds are normal.     Palpations: Abdomen is soft.  Musculoskeletal:        General: Normal range of motion.     Cervical back: Normal range of motion and neck supple.  Skin:    General: Skin is warm and dry.  Neurological:     General: No focal deficit present.     Mental Status: She is alert and oriented to person, place, and time.     Cranial Nerves: No cranial nerve deficit or facial asymmetry.     Sensory: No sensory deficit.     Motor: No weakness.      Coordination: Coordination normal.     Gait: Gait normal.  Psychiatric:        Mood and Affect: Mood normal.        Behavior: Behavior normal.        Thought Content: Thought content normal.        Judgment: Judgment normal.    BP 109/64   Pulse (!) 56   Temp (!) 97.1 F (36.2 C) (Temporal)   Ht '5\' 5"'$  (1.651 m)   Wt (!) 322 lb (146.1 kg)   SpO2 97%   BMI 53.58 kg/m  Wt Readings from Last 3 Encounters:  12/23/20 (!) 322 lb (146.1 kg)  10/20/20 (!) 314 lb 3.2 oz (142.5 kg)  09/22/20 (!) 313 lb (142 kg)    Health Maintenance Due  Topic Date Due   MAMMOGRAM  03/03/2014   COLONOSCOPY (Pts 45-2yr Insurance coverage will need to be confirmed)  Never done   Pneumococcal Vaccine [id number] old (2 - PPSV23 or PCV20) 08/07/2020    There are no preventive care reminders to display for this patient.   Lab Results  Component Value Date   TSH 2.70 10/20/2020   Lab Results  Component Value Date   WBC 7.7 10/20/2020   HGB 12.7 10/20/2020   HCT 37.4 10/20/2020   MCV 93.0 10/20/2020   PLT 249.0 10/20/2020   Lab Results  Component Value Date   NA 141 10/20/2020   K 4.3 10/20/2020   CO2 30 10/20/2020   GLUCOSE 95 10/20/2020   BUN 15 10/20/2020   CREATININE  0.81 10/20/2020   BILITOT 0.6 10/20/2020   ALKPHOS 113 10/20/2020   AST 18 10/20/2020   ALT 19 10/20/2020   PROT 6.9 10/20/2020   ALBUMIN 4.1 10/20/2020   CALCIUM 9.4 10/20/2020   ANIONGAP 8 02/16/2017   GFR 85.89 10/20/2020   Lab Results  Component Value Date   CHOL 159 10/20/2020   Lab Results  Component Value Date   HDL 55.40 10/20/2020   Lab Results  Component Value Date   LDLCALC 88 10/20/2020   Lab Results  Component Value Date   TRIG 75.0 10/20/2020   Lab Results  Component Value Date   CHOLHDL 3 10/20/2020   Lab Results  Component Value Date   HGBA1C 5.6 10/20/2020       Assessment & Plan:   Problem List Items Addressed This Visit   None   1. Migraine without aura and without  status migrainosus, not intractable 2. Other fatigue Possibly secondary to COVID-19 illness this year.  I personally reviewed her labs again with her.  There were no obvious underlying causes for her fatigue or headaches.  I discussed with her about possibly seeing headache wellness center or neurology.  She also was interested in possibly seeing the long-term COVID clinic in the future.  We ultimately decided today to try to start her on a migraine prophylactic medication to see if this brings her any relief.  A beta-blocker might not be the best fit for her because she does tend to run bradycardic already.  We then discussed Topamax as a possibility and I do want to run this by the clinical pharmacist prior to prescribing just to make sure there are no problems with the numerous other medications that she is taking.  3. Elevated vitamin B12 level Her vitamin B12 level from 10/20/2020 was 1196.  This level was repeated by [facility] on 12/10/2020 and was found to be 1427.  She has never taken B12 supplements or injections.  Given that her renal and liver function labs are normal, I suggested that she could wait and repeat this level again at her October appointment with new PCP and then determine at that point if any further testing or imaging is recommended.  4. OSA (obstructive sleep apnea) She has not been using a CPAP since 2018.  It was suggested to her to have a repeat sleep study done after her weight loss surgery, but she says this never happened because COVID-19 pandemic.  I wonder if this could be contributing to some of her fatigue and headaches.  I went ahead and ordered a new sleep study referral for her.  Total encounter time today was 39 minutes including face-to-face visit, review of records and labs, and discussion of plan moving forward, as well as dictation.  This note was prepared with assistance of Systems analyst. Occasional wrong-word or sound-a-like  substitutions may have occurred due to the inherent limitations of voice recognition software.   Seibert Keeter M Jessenya Berdan, PA-C

## 2020-12-24 ENCOUNTER — Encounter: Payer: BC Managed Care – PPO | Admitting: Internal Medicine

## 2020-12-28 DIAGNOSIS — F4323 Adjustment disorder with mixed anxiety and depressed mood: Secondary | ICD-10-CM | POA: Diagnosis not present

## 2020-12-30 DIAGNOSIS — H02834 Dermatochalasis of left upper eyelid: Secondary | ICD-10-CM | POA: Diagnosis not present

## 2020-12-30 DIAGNOSIS — H0510 Unspecified chronic inflammatory disorders of orbit: Secondary | ICD-10-CM | POA: Diagnosis not present

## 2020-12-30 DIAGNOSIS — H02831 Dermatochalasis of right upper eyelid: Secondary | ICD-10-CM | POA: Diagnosis not present

## 2020-12-30 DIAGNOSIS — H43812 Vitreous degeneration, left eye: Secondary | ICD-10-CM | POA: Diagnosis not present

## 2021-01-04 DIAGNOSIS — F4323 Adjustment disorder with mixed anxiety and depressed mood: Secondary | ICD-10-CM | POA: Diagnosis not present

## 2021-01-05 ENCOUNTER — Encounter: Payer: Self-pay | Admitting: Physician Assistant

## 2021-01-06 ENCOUNTER — Other Ambulatory Visit: Payer: Self-pay

## 2021-01-07 ENCOUNTER — Encounter: Payer: Self-pay | Admitting: Internal Medicine

## 2021-01-07 ENCOUNTER — Ambulatory Visit (INDEPENDENT_AMBULATORY_CARE_PROVIDER_SITE_OTHER): Payer: BC Managed Care – PPO | Admitting: Internal Medicine

## 2021-01-07 VITALS — BP 110/60 | HR 76 | Ht 65.0 in | Wt 323.2 lb

## 2021-01-07 DIAGNOSIS — K625 Hemorrhage of anus and rectum: Secondary | ICD-10-CM

## 2021-01-07 DIAGNOSIS — Z1211 Encounter for screening for malignant neoplasm of colon: Secondary | ICD-10-CM

## 2021-01-07 DIAGNOSIS — K5901 Slow transit constipation: Secondary | ICD-10-CM

## 2021-01-07 DIAGNOSIS — R110 Nausea: Secondary | ICD-10-CM

## 2021-01-07 DIAGNOSIS — Z1212 Encounter for screening for malignant neoplasm of rectum: Secondary | ICD-10-CM

## 2021-01-07 MED ORDER — SUTAB 1479-225-188 MG PO TABS: 1.0000 | ORAL_TABLET | Freq: Once | ORAL | 0 refills | Status: AC

## 2021-01-07 NOTE — Telephone Encounter (Signed)
Left voice message for patient to call clinic.  

## 2021-01-07 NOTE — Progress Notes (Signed)
HISTORY OF PRESENT ILLNESS:  Yvette White is a 49 y.o. female, massage therapist and biology teaching assistant at [employer], with morbid obesity status post gastric sleeve procedure who presents today regarding the need for screening colonoscopy.  I last saw the patient March 2018 regarding chronic GERD and dysphagia.  As well increased intestinal gas.  See that dictation for details.  She was scheduled for upper endoscopy but did not follow through with that recommendation.  She has not been seen since.  Patient tells me that she underwent gastric sleeve procedure around September 2018.  She initially lost 125 pounds but unfortunately has gained back 80 pounds.  Since her surgery she tells me that she has nonspecific nausea without vomiting.  She has no active reflux symptoms on PPI.  She also notices issues with constipation and occasional bright red blood per rectum.  No rectal pain.  MiraLAX does help her constipation.  She has not had prior colonoscopy.  Her mother has a history of gastric polyps.  Review of outside blood work from Oct 20, 2020 shows normal comprehensive metabolic panel and normal CBC with hemoglobin 12.7.  Review of x-ray file shows CT scan of the abdomen and pelvis December 2021 with no acute abnormalities.  She did undergo an esophagram in March 2018.  This was normal.  REVIEW OF SYSTEMS:  All non-GI ROS negative as otherwise stated in the HPI.  Past Medical History:  Diagnosis Date   ADD (attention deficit disorder), controlled with Adderall    Anemia    Anxiety    Arthritis    Chicken pox    Depression    Disorder of thyroid    GERD (gastroesophageal reflux disease)    Heart murmur    Hyperthyroidism    Menstrual disorder    uterine fibriods, polyps,cysts   Migraine    Morbid obesity ([place])    Neurocardiogenic syncope    OSA on CPAP    Pre-diabetes    Swallowing difficulty    Vitamin D deficiency     Past Surgical History:  Procedure Laterality Date    BIOPSY EYE MUSCLE Right    DILATATION & CURETTAGE/HYSTEROSCOPY WITH MYOSURE N/A 06/08/2016   Procedure: DILATATION & CURETTAGE/HYSTEROSCOPY;  Surgeon: Louretta Shorten, MD;  Location: [facility];  Service: Gynecology;  Laterality: N/A;   LAPAROSCOPIC GASTRIC SLEEVE RESECTION N/A 02/20/2017   Procedure: LAPAROSCOPIC GASTRIC SLEEVE RESECTION WITH UPPER ENDOSCOPY;  Surgeon: Johnathan Hausen, MD;  Location: [facility];  Service: General;  Laterality: N/A;   TILT TABLE STUDY     URETHRAL DILATION  1979   WISDOM TOOTH EXTRACTION      Social History Yvette White  reports that she quit smoking about 6 years ago. Her smoking use included cigarettes. She started smoking about 28 years ago. She has a 5.00 pack-year smoking history. She has never used smokeless tobacco. She reports current alcohol use. She reports that she does not use drugs.  family history includes Atrial fibrillation in her maternal grandmother; Colon cancer (age of onset: 2) in her maternal grandfather; Colon polyps in her mother; Depression in her mother; Diabetes in her father and mother; Heart attack (age of onset: 19) in her father; Heart disease in her father, mother, paternal grandfather, and paternal grandmother; Hyperlipidemia in her father and mother; Hypertension in her father and mother; Kidney disease in her mother; Obesity in her father and mother; Parkinson's disease in her maternal grandmother; Parkinsonism in her mother; Sleep apnea in her mother; Thyroid  disease in her mother; Uterine cancer in her mother.  Allergies  Allergen Reactions   Theophylline     Other reaction(s): Other (See Comments) Hyperactive  "Slo phylinne"   Penicillins Rash    Has patient had a PCN reaction causing immediate rash, facial/tongue/throat swelling, SOB or lightheadedness with hypotension:unsure Has patient had a PCN reaction causing severe rash involving mucus membranes or skin necrosis:unsure Has patient had a PCN reaction that required  hospitalization:No Has patient had a PCN reaction occurring within the last 10 years: No If all of the above answers are "NO", then may proceed with Cephalosporin use.    Pseudoephedrine Rash   Sulfamethoxazole-Trimethoprim Rash       PHYSICAL EXAMINATION: Vital signs: BP 110/60   Pulse 76   Ht '5\' 5"'$  (1.651 m)   Wt (!) 323 lb 3.2 oz (146.6 kg)   BMI 53.78 kg/m   Constitutional: Pleasant, generally well-appearing, no acute distress Psychiatric: alert and oriented x3, cooperative Eyes: extraocular movements intact, anicteric, conjunctiva pink Mouth: oral pharynx moist, no lesions Neck: supple no lymphadenopathy Cardiovascular: heart regular rate and rhythm, no murmur Lungs: clear to auscultation bilaterally Abdomen: soft, obese, nontender, nondistended, no obvious ascites, no peritoneal signs, normal bowel sounds, no organomegaly Rectal: Deferred until colonoscopy Extremities: no clubbing, cyanosis, or lower extremity edema bilaterally Skin: no lesions on visible extremities Neuro: No focal deficits.  Cranial nerves intact  ASSESSMENT:  1.  Screening colonoscopy.  High risk given BMI greater than 50. 2.  Morbid obesity.  BMI 53.78 3.  Minor rectal bleeding 4.  Functional constipation.  MiraLAX helps 5.  Nausea without vomiting 6.  GERD.  On PPI   PLAN:  1.  Reflux precautions 2.  Weight loss 3.  Continue PPI 4.  Schedule colonoscopy.  This for the purposes of colon cancer screening and to evaluate rectal bleeding and constipation.  This will need to be performed at the hospital given her BMI.  She is high risk as noted.The nature of the procedure, as well as the risks, benefits, and alternatives were carefully and thoroughly reviewed with the patient. Ample time for discussion and questions allowed. The patient understood, was satisfied, and agreed to proceed.  5.  Continue MiraLAX as needed

## 2021-01-07 NOTE — Patient Instructions (Signed)
You have been scheduled for a colonoscopy. Please follow written instructions given to you at your visit today.  Please pick up your prep supplies at the pharmacy within the next 1-3 days. If you use inhalers (even only as needed), please bring them with you on the day of your procedure.  If you are age 48 or older, your body mass index should be between 23-30. Your Body mass index is 53.78 kg/m. If this is out of the aforementioned range listed, please consider follow up with your Primary Care Provider.  If you are age 61 or younger, your body mass index should be between 19-25. Your Body mass index is 53.78 kg/m. If this is out of the aformentioned range listed, please consider follow up with your Primary Care Provider.   _________________________________________________________ The [facility] providers would like to encourage you to use [facility] to communicate with providers for non-urgent requests or questions.  Due to long hold times on the telephone, sending your provider a message by [facility] may be a faster and more efficient way to get a response.  Please allow 48 business hours for a response.  Please remember that this is for non-urgent requests.   Due to recent changes in healthcare laws, you may see the results of your imaging and laboratory studies on MyChart before your provider has had a chance to review them.  We understand that in some cases there may be results that are confusing or concerning to you. Not all laboratory results come back in the same time frame and the provider may be waiting for multiple results in order to interpret others.  Please give [place] 48 hours in order for your provider to thoroughly review all the results before contacting the office for clarification of your results.

## 2021-01-10 NOTE — Telephone Encounter (Signed)
Spoke with [facility].

## 2021-01-11 DIAGNOSIS — F4323 Adjustment disorder with mixed anxiety and depressed mood: Secondary | ICD-10-CM | POA: Diagnosis not present

## 2021-01-18 DIAGNOSIS — F4323 Adjustment disorder with mixed anxiety and depressed mood: Secondary | ICD-10-CM | POA: Diagnosis not present

## 2021-01-26 DIAGNOSIS — M9904 Segmental and somatic dysfunction of sacral region: Secondary | ICD-10-CM | POA: Diagnosis not present

## 2021-01-26 DIAGNOSIS — M9902 Segmental and somatic dysfunction of thoracic region: Secondary | ICD-10-CM | POA: Diagnosis not present

## 2021-01-26 DIAGNOSIS — M9901 Segmental and somatic dysfunction of cervical region: Secondary | ICD-10-CM | POA: Diagnosis not present

## 2021-01-26 DIAGNOSIS — M546 Pain in thoracic spine: Secondary | ICD-10-CM | POA: Diagnosis not present

## 2021-02-02 DIAGNOSIS — F4323 Adjustment disorder with mixed anxiety and depressed mood: Secondary | ICD-10-CM | POA: Diagnosis not present

## 2021-02-08 DIAGNOSIS — F4323 Adjustment disorder with mixed anxiety and depressed mood: Secondary | ICD-10-CM | POA: Diagnosis not present

## 2021-02-22 DIAGNOSIS — M9903 Segmental and somatic dysfunction of lumbar region: Secondary | ICD-10-CM | POA: Diagnosis not present

## 2021-02-22 DIAGNOSIS — M9902 Segmental and somatic dysfunction of thoracic region: Secondary | ICD-10-CM | POA: Diagnosis not present

## 2021-02-22 DIAGNOSIS — F4323 Adjustment disorder with mixed anxiety and depressed mood: Secondary | ICD-10-CM | POA: Diagnosis not present

## 2021-02-22 DIAGNOSIS — M9905 Segmental and somatic dysfunction of pelvic region: Secondary | ICD-10-CM | POA: Diagnosis not present

## 2021-02-22 DIAGNOSIS — M9904 Segmental and somatic dysfunction of sacral region: Secondary | ICD-10-CM | POA: Diagnosis not present

## 2021-02-24 ENCOUNTER — Encounter (HOSPITAL_COMMUNITY): Payer: Self-pay | Admitting: Internal Medicine

## 2021-02-24 DIAGNOSIS — F900 Attention-deficit hyperactivity disorder, predominantly inattentive type: Secondary | ICD-10-CM | POA: Diagnosis not present

## 2021-02-24 DIAGNOSIS — F3189 Other bipolar disorder: Secondary | ICD-10-CM | POA: Diagnosis not present

## 2021-02-24 NOTE — Progress Notes (Signed)
Attempted to obtain medical history via telephone, unable to reach at this time. I left a voicemail to return pre surgical testing department's phone call.  

## 2021-03-04 DIAGNOSIS — E0590 Thyrotoxicosis, unspecified without thyrotoxic crisis or storm: Secondary | ICD-10-CM | POA: Diagnosis not present

## 2021-03-04 DIAGNOSIS — K219 Gastro-esophageal reflux disease without esophagitis: Secondary | ICD-10-CM | POA: Diagnosis not present

## 2021-03-04 DIAGNOSIS — R5383 Other fatigue: Secondary | ICD-10-CM | POA: Diagnosis not present

## 2021-03-04 DIAGNOSIS — R0683 Snoring: Secondary | ICD-10-CM | POA: Diagnosis not present

## 2021-03-04 DIAGNOSIS — Z79899 Other long term (current) drug therapy: Secondary | ICD-10-CM | POA: Diagnosis not present

## 2021-03-07 ENCOUNTER — Encounter (HOSPITAL_COMMUNITY): Payer: Self-pay | Admitting: Internal Medicine

## 2021-03-07 ENCOUNTER — Ambulatory Visit (HOSPITAL_COMMUNITY): Payer: BC Managed Care – PPO | Admitting: Certified Registered Nurse Anesthetist

## 2021-03-07 ENCOUNTER — Encounter (HOSPITAL_COMMUNITY)
Admission: RE | Disposition: A | Discharge status: Home / Self Care | Payer: Self-pay | Source: Home / Self Care | Attending: Internal Medicine

## 2021-03-07 ENCOUNTER — Other Ambulatory Visit: Payer: Self-pay

## 2021-03-07 ENCOUNTER — Ambulatory Visit (HOSPITAL_COMMUNITY)
Admission: RE | Admit: 2021-03-07 | Discharge: 2021-03-07 | Disposition: A | Discharge status: Home / Self Care | Payer: BC Managed Care – PPO | Attending: Internal Medicine | Admitting: Internal Medicine

## 2021-03-07 DIAGNOSIS — K5904 Chronic idiopathic constipation: Secondary | ICD-10-CM | POA: Diagnosis not present

## 2021-03-07 DIAGNOSIS — Z1211 Encounter for screening for malignant neoplasm of colon: Secondary | ICD-10-CM

## 2021-03-07 DIAGNOSIS — Z836 Family history of other diseases of the respiratory system: Secondary | ICD-10-CM | POA: Insufficient documentation

## 2021-03-07 DIAGNOSIS — R110 Nausea: Secondary | ICD-10-CM | POA: Insufficient documentation

## 2021-03-07 DIAGNOSIS — Z841 Family history of disorders of kidney and ureter: Secondary | ICD-10-CM | POA: Diagnosis not present

## 2021-03-07 DIAGNOSIS — K625 Hemorrhage of anus and rectum: Secondary | ICD-10-CM | POA: Diagnosis not present

## 2021-03-07 DIAGNOSIS — Z881 Allergy status to other antibiotic agents status: Secondary | ICD-10-CM | POA: Diagnosis not present

## 2021-03-07 DIAGNOSIS — K635 Polyp of colon: Secondary | ICD-10-CM | POA: Diagnosis not present

## 2021-03-07 DIAGNOSIS — Z800 Family history of malignant neoplasm of digestive organs: Secondary | ICD-10-CM | POA: Insufficient documentation

## 2021-03-07 DIAGNOSIS — K648 Other hemorrhoids: Secondary | ICD-10-CM | POA: Insufficient documentation

## 2021-03-07 DIAGNOSIS — Z8249 Family history of ischemic heart disease and other diseases of the circulatory system: Secondary | ICD-10-CM | POA: Insufficient documentation

## 2021-03-07 DIAGNOSIS — Z1212 Encounter for screening for malignant neoplasm of rectum: Secondary | ICD-10-CM | POA: Diagnosis not present

## 2021-03-07 DIAGNOSIS — D120 Benign neoplasm of cecum: Secondary | ICD-10-CM | POA: Diagnosis not present

## 2021-03-07 DIAGNOSIS — Z9884 Bariatric surgery status: Secondary | ICD-10-CM | POA: Diagnosis not present

## 2021-03-07 DIAGNOSIS — Z8049 Family history of malignant neoplasm of other genital organs: Secondary | ICD-10-CM | POA: Diagnosis not present

## 2021-03-07 DIAGNOSIS — Z6841 Body Mass Index (BMI) 40.0 and over, adult: Secondary | ICD-10-CM | POA: Diagnosis not present

## 2021-03-07 DIAGNOSIS — Z87891 Personal history of nicotine dependence: Secondary | ICD-10-CM | POA: Insufficient documentation

## 2021-03-07 DIAGNOSIS — Z888 Allergy status to other drugs, medicaments and biological substances status: Secondary | ICD-10-CM | POA: Diagnosis not present

## 2021-03-07 DIAGNOSIS — Z8379 Family history of other diseases of the digestive system: Secondary | ICD-10-CM | POA: Diagnosis not present

## 2021-03-07 DIAGNOSIS — Z820 Family history of epilepsy and other diseases of the nervous system: Secondary | ICD-10-CM | POA: Insufficient documentation

## 2021-03-07 DIAGNOSIS — E559 Vitamin D deficiency, unspecified: Secondary | ICD-10-CM | POA: Diagnosis not present

## 2021-03-07 DIAGNOSIS — K219 Gastro-esophageal reflux disease without esophagitis: Secondary | ICD-10-CM | POA: Diagnosis not present

## 2021-03-07 DIAGNOSIS — Z880 Allergy status to penicillin: Secondary | ICD-10-CM | POA: Insufficient documentation

## 2021-03-07 HISTORY — PX: POLYPECTOMY: SHX5525

## 2021-03-07 HISTORY — PX: COLONOSCOPY WITH PROPOFOL: SHX5780

## 2021-03-07 SURGERY — COLONOSCOPY WITH PROPOFOL
Anesthesia: Monitor Anesthesia Care

## 2021-03-07 MED ORDER — PROPOFOL 1000 MG/100ML IV EMUL
INTRAVENOUS | Status: AC
  Filled 2021-03-07: qty 100

## 2021-03-07 MED ORDER — PROPOFOL 500 MG/50ML IV EMUL
INTRAVENOUS | Status: DC | PRN
  Administered 2021-03-07: 75 ug/kg/min via INTRAVENOUS
  Administered 2021-03-07: 125 ug/kg/min via INTRAVENOUS

## 2021-03-07 MED ORDER — SODIUM CHLORIDE 0.9 % IV SOLN: INTRAVENOUS | Status: DC

## 2021-03-07 MED ORDER — LACTATED RINGERS IV SOLN
INTRAVENOUS | Status: DC
  Administered 2021-03-07: 1000 mL via INTRAVENOUS

## 2021-03-07 MED ORDER — STERILE WATER FOR IRRIGATION IR SOLN
Status: DC | PRN
  Administered 2021-03-07: 120 mL

## 2021-03-07 SURGICAL SUPPLY — 22 items

## 2021-03-07 NOTE — Transfer of Care (Signed)
Immediate Anesthesia Transfer of Care Note  Patient: ZHARA GIESKE  Procedure(s) Performed: Procedure(s): COLONOSCOPY WITH PROPOFOL (N/A) POLYPECTOMY  Patient Location: PACU  Anesthesia Type:MAC  Level of Consciousness: Patient easily awoken, sedated, comfortable, cooperative, following commands, responds to stimulation.   Airway & Oxygen Therapy: Patient spontaneously breathing, ventilating well, oxygen via simple oxygen mask.  Post-op Assessment: Report given to PACU RN, vital signs reviewed and stable, moving all extremities.   Post vital signs: Reviewed and stable.  Complications: No apparent anesthesia complications  Last Vitals:  Vitals Value Taken Time  BP    Temp    Pulse 59 03/07/21 0933  Resp 20 03/07/21 0933  SpO2 96 % 03/07/21 0933  Vitals shown include unvalidated device data.  Last Pain:  Vitals:   03/07/21 0750  TempSrc: Oral  PainSc: 0-No pain         Complications: No notable events documented.

## 2021-03-07 NOTE — Anesthesia Preprocedure Evaluation (Signed)
Anesthesia Evaluation  Patient identified by MRN, date of birth, ID band Patient awake    Reviewed: Allergy & Precautions, NPO status , Patient's Chart, lab work & pertinent test results  Airway Mallampati: II  TM Distance: >3 FB Neck ROM: Full    Dental no notable dental hx.    Pulmonary sleep apnea , former smoker,    Pulmonary exam normal breath sounds clear to auscultation       Cardiovascular negative cardio ROS Normal cardiovascular exam Rhythm:Regular Rate:Normal     Neuro/Psych  Headaches, PSYCHIATRIC DISORDERS Anxiety Depression Bipolar Disorder    GI/Hepatic Neg liver ROS, Bowel prep,GERD  Medicated and Controlled,  Endo/Other  Morbid obesity  Renal/GU negative Renal ROS     Musculoskeletal  (+) Arthritis ,   Abdominal (+) + obese,   Peds  (+) ATTENTION DEFICIT DISORDER WITHOUT HYPERACTIVITY Hematology negative hematology ROS (+)   Anesthesia Other Findings COLORECTAL SCREENING  Reproductive/Obstetrics                             Anesthesia Physical Anesthesia Plan  ASA: 4  Anesthesia Plan: MAC   Post-op Pain Management:    Induction: Intravenous  PONV Risk Score and Plan: 2 and Propofol infusion and Treatment may vary due to age or medical condition  Airway Management Planned: Simple Face Mask  Additional Equipment:   Intra-op Plan:   Post-operative Plan:   Informed Consent: I have reviewed the patients History and Physical, chart, labs and discussed the procedure including the risks, benefits and alternatives for the proposed anesthesia with the patient or authorized representative who has indicated his/her understanding and acceptance.     Dental advisory given  Plan Discussed with: CRNA  Anesthesia Plan Comments: (Pregnancy test offered to and declined by patient )        Anesthesia Quick Evaluation

## 2021-03-07 NOTE — Anesthesia Procedure Notes (Addendum)
Procedure Name: MAC Date/Time: 03/07/2021 8:58 AM Performed by: Deliah Boston, CRNA Pre-anesthesia Checklist: Patient identified, Emergency Drugs available, Suction available and Patient being monitored Patient Re-evaluated:Patient Re-evaluated prior to induction Oxygen Delivery Method: Simple face mask Preoxygenation: Pre-oxygenation with 100% oxygen Placement Confirmation: positive ETCO2

## 2021-03-07 NOTE — Anesthesia Postprocedure Evaluation (Signed)
Anesthesia Post Note  Patient: Yvette White  Procedure(s) Performed: COLONOSCOPY WITH PROPOFOL POLYPECTOMY     Patient location during evaluation: Endoscopy Anesthesia Type: MAC Level of consciousness: awake Pain management: pain level controlled Vital Signs Assessment: post-procedure vital signs reviewed and stable Respiratory status: spontaneous breathing, nonlabored ventilation, respiratory function stable and patient connected to nasal cannula oxygen Cardiovascular status: stable and blood pressure returned to baseline Postop Assessment: no apparent nausea or vomiting Anesthetic complications: no   No notable events documented.  Last Vitals:  Vitals:   03/07/21 0940 03/07/21 0950  BP: (!) 141/64 133/69  Pulse: (!) 58 (!) 57  Resp: 17 19  Temp:    SpO2: 97% 98%    Last Pain:  Vitals:   03/07/21 0950  TempSrc:   PainSc: 0-No pain                 Alejandria Wessells P Verma Grothaus

## 2021-03-07 NOTE — H&P (Signed)
GI PREPROCEDURE H&P  Patient presents today for screening colonoscopy.  She was fully evaluated in the office January 08, 2021.  There have been no interval changes.  Full H&P from that encounter as outlined below without additions or deletions.  HISTORY OF PRESENT ILLNESS:   Yvette White is a 50 y.o. female, massage therapist and biology teaching assistant at [employer], with morbid obesity status post gastric sleeve procedure who presents today regarding the need for screening colonoscopy.  I last saw the patient March 2018 regarding chronic GERD and dysphagia.  As well increased intestinal gas.  See that dictation for details.  She was scheduled for upper endoscopy but did not follow through with that recommendation.  She has not been seen since.  Patient tells me that she underwent gastric sleeve procedure around September 2018.  She initially lost 125 pounds but unfortunately has gained back 80 pounds.  Since her surgery she tells me that she has nonspecific nausea without vomiting.  She has no active reflux symptoms on PPI.  She also notices issues with constipation and occasional bright red blood per rectum.  No rectal pain.  MiraLAX does help her constipation.  She has not had prior colonoscopy.  Her mother has a history of gastric polyps.  Review of outside blood work from Oct 20, 2020 shows normal comprehensive metabolic panel and normal CBC with hemoglobin 12.7.  Review of x-ray file shows CT scan of the abdomen and pelvis December 2021 with no acute abnormalities.  She did undergo an esophagram in March 2018.  This was normal.   REVIEW OF SYSTEMS:   All non-GI ROS negative as otherwise stated in the HPI.       Past Medical History:  Diagnosis Date   ADD (attention deficit disorder), controlled with Adderall     Anemia     Anxiety     Arthritis     Chicken pox     Depression     Disorder of thyroid     GERD (gastroesophageal reflux disease)     Heart murmur      Hyperthyroidism     Menstrual disorder      uterine fibriods, polyps,cysts   Migraine     Morbid obesity ([place])     Neurocardiogenic syncope     OSA on CPAP     Pre-diabetes     Swallowing difficulty     Vitamin D deficiency             Past Surgical History:  Procedure Laterality Date   BIOPSY EYE MUSCLE Right     DILATATION & CURETTAGE/HYSTEROSCOPY WITH MYOSURE N/A 06/08/2016    Procedure: DILATATION & CURETTAGE/HYSTEROSCOPY;  Surgeon: Louretta Shorten, MD;  Location: [facility];  Service: Gynecology;  Laterality: N/A;   LAPAROSCOPIC GASTRIC SLEEVE RESECTION N/A 02/20/2017    Procedure: LAPAROSCOPIC GASTRIC SLEEVE RESECTION WITH UPPER ENDOSCOPY;  Surgeon: Johnathan Hausen, MD;  Location: [facility];  Service: General;  Laterality: N/A;   TILT TABLE STUDY       URETHRAL DILATION   1979   WISDOM TOOTH EXTRACTION          Social History AMANDINE COVINO  reports that she quit smoking about 6 years ago. Her smoking use included cigarettes. She started smoking about 28 years ago. She has a 5.00 pack-year smoking history. She has never used smokeless tobacco. She reports current alcohol use. She reports that she does not use drugs.   family history includes Atrial fibrillation in  her maternal grandmother; Colon cancer (age of onset: 88) in her maternal grandfather; Colon polyps in her mother; Depression in her mother; Diabetes in her father and mother; Heart attack (age of onset: 17) in her father; Heart disease in her father, mother, paternal grandfather, and paternal grandmother; Hyperlipidemia in her father and mother; Hypertension in her father and mother; Kidney disease in her mother; Obesity in her father and mother; Parkinson's disease in her maternal grandmother; Parkinsonism in her mother; Sleep apnea in her mother; Thyroid disease in her mother; Uterine cancer in her mother.        Allergies  Allergen Reactions   Theophylline        Other reaction(s): Other (See Comments) Hyperactive   "Slo  phylinne"   Penicillins Rash      Has patient had a PCN reaction causing immediate rash, facial/tongue/throat swelling, SOB or lightheadedness with hypotension:unsure Has patient had a PCN reaction causing severe rash involving mucus membranes or skin necrosis:unsure Has patient had a PCN reaction that required hospitalization:No Has patient had a PCN reaction occurring within the last 10 years: No If all of the above answers are "NO", then may proceed with Cephalosporin use.     Pseudoephedrine Rash   Sulfamethoxazole-Trimethoprim Rash          PHYSICAL EXAMINATION: Vital signs: BP 110/60   Pulse 76   Ht 5\' 5"  (1.651 m)   Wt (!) 323 lb 3.2 oz (146.6 kg)   BMI 53.78 kg/m   Constitutional: Pleasant, generally well-appearing, no acute distress Psychiatric: alert and oriented x3, cooperative Eyes: extraocular movements intact, anicteric, conjunctiva pink Mouth: oral pharynx moist, no lesions Neck: supple no lymphadenopathy Cardiovascular: heart regular rate and rhythm, no murmur Lungs: clear to auscultation bilaterally Abdomen: soft, obese, nontender, nondistended, no obvious ascites, no peritoneal signs, normal bowel sounds, no organomegaly Rectal: Deferred until colonoscopy Extremities: no clubbing, cyanosis, or lower extremity edema bilaterally Skin: no lesions on visible extremities Neuro: No focal deficits.  Cranial nerves intact   ASSESSMENT:   1.  Screening colonoscopy.  High risk given BMI greater than 50. 2.  Morbid obesity.  BMI 53.78 3.  Minor rectal bleeding 4.  Functional constipation.  MiraLAX helps 5.  Nausea without vomiting 6.  GERD.  On PPI     PLAN:   1.  Reflux precautions 2.  Weight loss 3.  Continue PPI 4.  Schedule colonoscopy.  This for the purposes of colon cancer screening and to evaluate rectal bleeding and constipation.  This will need to be performed at the hospital given her BMI.  She is high risk as noted.The nature of the procedure, as  well as the risks, benefits, and alternatives were carefully and thoroughly reviewed with the patient. Ample time for discussion and questions allowed. The patient understood, was satisfied, and agreed to proceed.  5.  Continue MiraLAX as needed

## 2021-03-07 NOTE — Discharge Instructions (Signed)

## 2021-03-07 NOTE — Op Note (Signed)
[facility] Patient Name: Yvette White Procedure Date: 03/07/2021 MRN: [id number] Attending MD: Docia Chuck. Henrene Pastor , MD Date of Birth: 10/08/1970 CSN: [id number] Age: 50 Admit Type: Outpatient Procedure:                Colonoscopy with cold snare polypectomy x 1 Indications:              Screening for colorectal malignant neoplasm Providers:                Docia Chuck. Henrene Pastor, MD, Dulcy Fanny, Tyna Jaksch                            Technician Referring MD:             Caren Macadam, MD Medicines:                Monitored Anesthesia Care Complications:            No immediate complications. Estimated blood loss:                            None. Estimated Blood Loss:     Estimated blood loss: none. Procedure:                Pre-Anesthesia Assessment:                           - Prior to the procedure, a History and Physical                            was performed, and patient medications and                            allergies were reviewed. The patient's tolerance of                            previous anesthesia was also reviewed. The risks                            and benefits of the procedure and the sedation                            options and risks were discussed with the patient.                            All questions were answered, and informed consent                            was obtained. Prior Anticoagulants: The patient has                            taken no previous anticoagulant or antiplatelet                            agents. ASA Grade Assessment: III - A patient with  severe systemic disease. After reviewing the risks                            and benefits, the patient was deemed in                            satisfactory condition to undergo the procedure.                           After obtaining informed consent, the colonoscope                            was passed under direct vision. Throughout the                             procedure, the patient's blood pressure, pulse, and                            oxygen saturations were monitored continuously. The                            CF-HQ190L ([id number]) Olympus colonoscope was                            introduced through the anus and advanced to the the                            cecum, identified by appendiceal orifice and                            ileocecal valve. The ileocecal valve, appendiceal                            orifice, and rectum were photographed. The quality                            of the bowel preparation was excellent. The                            colonoscopy was performed without difficulty. The                            patient tolerated the procedure well. The bowel                            preparation used was SUPREP via split dose                            instruction. Scope In: 9:14:06 AM Scope Out: 9:26:12 AM Scope Withdrawal Time: 0 hours 8 minutes 56 seconds  Total Procedure Duration: 0 hours 12 minutes 6 seconds  Findings:      A 5 mm polyp was found in the cecum. The polyp was removed with a cold       snare. Resection and retrieval were  complete.      Internal hemorrhoids were found during retroflexion. The hemorrhoids       were small.      The exam was otherwise without abnormality on direct and retroflexion       views. Impression:               - One 5 mm polyp in the cecum, removed with a cold                            snare. Resected and retrieved.                           - Internal hemorrhoids.                           - The examination was otherwise normal on direct                            and retroflexion views. Moderate Sedation:      none Recommendation:           - Repeat colonoscopy in 7 years for surveillance.                           - Patient has a contact number available for                            emergencies. The signs and symptoms of potential                             delayed complications were discussed with the                            patient. Return to normal activities tomorrow.                            Written discharge instructions were provided to the                            patient.                           - Resume previous diet.                           - Continue present medications.                           - Await pathology results. Procedure Code(s):        --- Professional ---                           [phone], Colonoscopy, flexible; with removal of                            tumor(s), polyp(s), or other lesion(s) by snare  technique Diagnosis Code(s):        --- Professional ---                           Z12.11, Encounter for screening for malignant                            neoplasm of colon                           K63.5, Polyp of colon                           K64.8, Other hemorrhoids CPT copyright 2019 [employer]. All rights reserved. The codes documented in this report are preliminary and upon coder review may  be revised to meet current compliance requirements. Docia Chuck. Henrene Pastor, MD 03/07/2021 9:33:00 AM This report has been signed electronically. Number of Addenda: 0

## 2021-03-08 DIAGNOSIS — F4323 Adjustment disorder with mixed anxiety and depressed mood: Secondary | ICD-10-CM | POA: Diagnosis not present

## 2021-03-08 LAB — SURGICAL PATHOLOGY

## 2021-03-10 ENCOUNTER — Encounter (HOSPITAL_COMMUNITY): Payer: Self-pay | Admitting: Internal Medicine

## 2021-03-10 ENCOUNTER — Encounter: Payer: BC Managed Care – PPO | Admitting: Family Medicine

## 2021-03-15 DIAGNOSIS — F4323 Adjustment disorder with mixed anxiety and depressed mood: Secondary | ICD-10-CM | POA: Diagnosis not present

## 2021-03-22 DIAGNOSIS — F4323 Adjustment disorder with mixed anxiety and depressed mood: Secondary | ICD-10-CM | POA: Diagnosis not present

## 2021-03-23 ENCOUNTER — Other Ambulatory Visit: Payer: Self-pay | Admitting: Family Medicine

## 2021-03-23 DIAGNOSIS — K219 Gastro-esophageal reflux disease without esophagitis: Secondary | ICD-10-CM

## 2021-04-07 DIAGNOSIS — F4323 Adjustment disorder with mixed anxiety and depressed mood: Secondary | ICD-10-CM | POA: Diagnosis not present

## 2021-04-11 ENCOUNTER — Ambulatory Visit (INDEPENDENT_AMBULATORY_CARE_PROVIDER_SITE_OTHER): Payer: BC Managed Care – PPO | Admitting: Neurology

## 2021-04-11 ENCOUNTER — Encounter: Payer: Self-pay | Admitting: Neurology

## 2021-04-11 VITALS — BP 116/78 | HR 60 | Ht 66.0 in | Wt 319.0 lb

## 2021-04-11 DIAGNOSIS — G4733 Obstructive sleep apnea (adult) (pediatric): Secondary | ICD-10-CM

## 2021-04-11 DIAGNOSIS — R635 Abnormal weight gain: Secondary | ICD-10-CM | POA: Diagnosis not present

## 2021-04-11 DIAGNOSIS — Z6841 Body Mass Index (BMI) 40.0 and over, adult: Secondary | ICD-10-CM

## 2021-04-11 DIAGNOSIS — G4719 Other hypersomnia: Secondary | ICD-10-CM | POA: Diagnosis not present

## 2021-04-11 DIAGNOSIS — R519 Headache, unspecified: Secondary | ICD-10-CM

## 2021-04-11 NOTE — Progress Notes (Signed)
Subjective:    Patient ID: Yvette White is a 50 y.o. female.  HPI    Yvette Age, MD, PhD [facility] [address], [address] [place], [place] [address]  Dear Yvette White,  I saw your patient, Yvette White, upon your kind request in my sleep clinic today for evaluation of her sleep disturbance, in particular, her prior diagnosis of obstructive sleep apnea.  The patient is unaccompanied today.  As you know, Yvette White is a 50 year old right-handed woman with an underlying medical history of anemia, migraine headaches, arthritis, ADD, reflux disease, thyroid disease, vitamin D deficiency, anxiety, depression, and severe obesity with a BMI of over 50, status post weight loss surgery in 2018, who was previously diagnosed with obstructive sleep apnea.  She reports increasing fatigue in the past year.  She has had some weight fluctuation, she is currently seeing a nutritionist but has not seen her bariatric surgeon since the pandemic.  She had COVID about a year ago and feels that her fatigue increased after that.  She is supposed to see an ENT specialist because of sinus pain.  She denies any significant postnasal drip or drainage but does use Nasacort as needed.  I had seen her a few years ago and she had stopped using CPAP therapy a little over 3 years ago as she had achieved a significant amount of weight loss.  I had suggested we proceed with a home sleep test for reevaluation of her sleep apnea at the time.  She did not schedule the test.  She has not been seen in this office in over 3 years.  I reviewed your office note from 12/23/2020.  Her Epworth sleepiness score is 1 out of 24, fatigue severity score is 48 out of 63.  Her bedtime varies, generally between midnight and 2 AM and her rise time is generally between 8 and 10 AM.  She lives with her mom who is 48 years old.  She drinks caffeine in the form of coffee, and typically limited to 1 cup in the mornings.  She  does not drink alcohol on a regular basis, typically only on special occasions, she is a non-smoker.  She is not sure that she has her AutoPap machine any longer.  She has not had recent medication changes.  She has not taken her stimulant for ADHD in years.  She is on a stable dose of Lamictal.  She has occasionally woken up with a headache, she denies night to night nocturia. She had blood work in the recent past.  Her vitamin B12 level was 1427 on 12/10/2020.  Her vitamin D level was 49.51 on 10/20/2020.   Previously (copied from previous notes for reference):   02/06/2018: 50 year old right-handed woman with an underlying medical history of vitamin D deficiency, history of syncope, reflux disease, hypothyroidism, depression, ADHD and morbid obesity, who presents for follow-up consultation of her obstructive sleep apnea, on treatment with AutoPap. The patient is unaccompanied today. I last saw her on 04/26/2017, at which time she was compliant with her AutoPap therapy. She had some difficulty with the mask and with humidity setting. Her headaches had improved after starting AutoPap. She had some right sided eye pain and retro-orbital pain and periorbital pain, was on steroids but was advised by her bariatric surgeon that she would be at risk if she were on long-term steroids because of healing issues and risk for ulcer. She was seen by ophthalmology, she had laparoscopic bariatric surgery in  September 2018. She was diagnosed with an inflammatory eye disease and was placed on steroids by her ophthalmologist. Her eye pain improved after she started the steroids.   I reviewed her AutoPap compliance data from the last 6 months from 08/09/2017 through 02/04/2018, during which time she used her machine 71 days. She reports that she stopped using her AutoPap because she felt he did not need it any longer. She has lost weight after her weight loss surgery last year. She does not snore to any significant degree any  longer. She overall feels better after her weight loss. She takes her supplements as prescribed and as instructed.    She had a CT orbits with IV contrast on 04/11/2017 and I reviewed the results: IMPRESSION:   1.   Apparent mild retrobulbar stranding on the right especially extending about the lateral aspect of the optic nerve. 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   2.  No evidence of preseptal or postseptal collection or mass.   3.  Normal appearance of the globes and extraocular muscles.        I first met her on 06/27/2016 at the request of her primary care provider, at which time the patient reported snoring and excessive daytime somnolence. I suggested we proceed with sleep study testing. Her insurance denied and attended sleep study. She had a home sleep test on 09/13/2016 which showed an AHI of 6.2 per hour, average oxygen saturation of 94%, nadir of 86%. She was advised to start AutoPap therapy.   I reviewed her AutoPap compliance data from 03/26/2017 through 04/24/2017 which is a total of 30 days, during which time she used her machine every night with percent used days greater than 4 hours at 97%, indicating excellent compliance with an average usage of 7 hours and 52 minutes, residual AHI 6.6 per hour, 95th percentile at 10.4 cm, leak very low with the 95th percentile at 0.1 L/m on a pressure range of 6-12 cm with EPR.    06/27/2016: (She) reports snoring and excessive daytime somnolence. I reviewed your office note from 03/06/2016, which you kindly included. She has a hx of syncope and has sleep studies in the remote past. Last sleep study was about 10 years ago, per patient neg. for OSA. She sees a psychiatrist for depression. She is hoping to wean off her medication. She has been able to come off some of her medications. She needs at least 8 hours of sleep to feel rested. 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 Her mother and her maternal aunt have obstructive sleep apnea and uses CPAP machine. Patient is single, has no children, lives with mom, sister and niece. She is known to snore and also endorses waking up with a sense of gasping for air. She also has reflux symptoms at night and is supposed to see a gastroenterologist. She is also considering weight loss surgery. She quit smoking in May 2016, takes alcohol about 5 glasses a month, caffeine 1-2 cups daily. She denies any frank restless leg symptoms or leg twitching at night. She tries to make sure she gets about 8 hours of sleep on any given night. She has a history of syncope, thankfully none and about 9 or 10 years, will reestablish care with her cardiologist. She had an abnormal tilt  table test in the past. She has nearly daily AM HAs, no meds for it, last about 3 hours, non migrainous, used to have migraines as a child.  She feels tired during the day, Epworth sleepiness score is 6 out of 24 today, her fatigue score is 39 out of 63.   Her Past Medical History Is Significant For: Past Medical History:  Diagnosis Date   ADD (attention deficit disorder), controlled with Adderall    Anemia    Anxiety    Arthritis    Chicken pox    Depression    Disorder of thyroid    GERD (gastroesophageal reflux disease)    Heart murmur    Hyperthyroidism    Menstrual disorder    uterine fibriods, polyps,cysts   Migraine    Morbid obesity ([place])    Neurocardiogenic syncope    OSA on CPAP    Pre-diabetes    Swallowing difficulty    Vitamin D deficiency     Her Past Surgical History Is Significant For: Past Surgical History:  Procedure Laterality Date   BIOPSY EYE MUSCLE Right    COLONOSCOPY WITH PROPOFOL N/A 03/07/2021   Procedure: COLONOSCOPY WITH PROPOFOL;  Surgeon: Irene Shipper, MD;  Location: [facility]  ENDOSCOPY;  Service: Endoscopy;  Laterality: N/A;   DILATATION & CURETTAGE/HYSTEROSCOPY WITH MYOSURE N/A 06/08/2016   Procedure: DILATATION & CURETTAGE/HYSTEROSCOPY;  Surgeon: Louretta Shorten, MD;  Location: [facility];  Service: Gynecology;  Laterality: N/A;   LAPAROSCOPIC GASTRIC SLEEVE RESECTION N/A 02/20/2017   Procedure: LAPAROSCOPIC GASTRIC SLEEVE RESECTION WITH UPPER ENDOSCOPY;  Surgeon: Johnathan Hausen, MD;  Location: [facility];  Service: General;  Laterality: N/A;   POLYPECTOMY  03/07/2021   Procedure: POLYPECTOMY;  Surgeon: Irene Shipper, MD;  Location: [facility];  Service: Endoscopy;;   TILT TABLE STUDY     URETHRAL DILATION  1979   WISDOM TOOTH EXTRACTION      Her Family History Is Significant For: Family History  Problem Relation White of Onset   Diabetes Mother    Heart disease Mother    Uterine cancer Mother    Hypertension Mother    Hyperlipidemia Mother    Kidney disease Mother    Thyroid disease Mother    Colon polyps Mother    Depression Mother    Sleep apnea Mother    Obesity Mother    Parkinsonism Mother    Heart attack Father 51   Diabetes Father    Heart disease Father    Hypertension Father    Hyperlipidemia Father    Obesity Father    Atrial fibrillation Maternal Grandmother    Parkinson's disease Maternal Grandmother    Colon cancer Maternal Grandfather 45   Heart disease Paternal Grandmother    Heart disease Paternal Grandfather    Esophageal cancer Neg Hx    Stomach cancer Neg Hx    Pancreatic cancer Neg Hx    Liver disease Neg Hx     Her Social History Is Significant For: Social History   Socioeconomic History   Marital status: Single    Spouse name: Not on file   Number of children: 0   Years of education: BA   Highest education level: Not on file  Occupational History   Occupation: Freight forwarder, Geophysicist/field seismologist, Art therapist  Tobacco Use   Smoking status: Former    Packs/day: 0.25    Years: 20.00    Pack years: 5.00    Types: Cigarettes     Start date: 1994  Quit date: 09/30/2014    Years since quitting: 6.5   Smokeless tobacco: Never  Vaping Use   Vaping Use: Never used  Substance and Sexual Activity   Alcohol use: Yes    Comment: occ   Drug use: No   Sexual activity: Not on file  Other Topics Concern   Not on file  Social History Narrative   Drinks 1-2 caffeine drinks a day. 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    Lives with her elderly mother   Right handed   Social Determinants of Health   Financial Resource Strain: Not on file  Food Insecurity: Not on file  Transportation Needs: Not on file  Physical Activity: Not on file  Stress: Not on file  Social Connections: Not on file    Her Allergies Are:  Allergies  Allergen Reactions   Theophylline     Other reaction(s): Other (See Comments) Hyperactive  "Slo phylinne"   Penicillins Rash    Has patient had a PCN reaction causing immediate rash, facial/tongue/throat swelling, SOB or lightheadedness with hypotension:unsure Has patient had a PCN reaction causing severe rash involving mucus membranes or skin necrosis:unsure Has patient had a PCN reaction that required hospitalization:No Has patient had a PCN reaction occurring within the last 10 years: No If all of the above answers are "NO", then may proceed with Cephalosporin use.    Pseudoephedrine Rash   Sulfamethoxazole-Trimethoprim Rash  :   Her Current Medications Are:  Outpatient Encounter Medications as of 04/11/2021  Medication Sig   acetaminophen (TYLENOL) 500 MG tablet Take 1,000 mg by mouth every 6 (six) hours as needed (for pain.).   Adalimumab 40 MG/0.4ML PSKT Inject 40 mg into the skin once a week.   Amphet-Dextroamphet 3-Bead ER (MYDAYIS) 50 MG CP24 Take 50 mg by mouth daily as needed (ADHD).   buPROPion (WELLBUTRIN XL) 150 MG 24 hr tablet Take 450 mg by mouth daily.   calcium carbonate (TUMS - DOSED IN MG ELEMENTAL CALCIUM) 500 MG chewable tablet  Chew 1 tablet by mouth 3 (three) times daily.   Cholecalciferol (VITAMIN D) 125 MCG (5000 UT) CAPS Take 5,000 Units by mouth daily.   fluticasone (FLONASE) 50 MCG/ACT nasal spray Place 1 spray into both nostrils daily as needed for allergies or rhinitis.   folic acid (FOLVITE) 1 MG tablet Take 2 mg by mouth daily.   lamoTRIgine (LAMICTAL) 200 MG tablet Take 200 mg by mouth every evening.    loratadine (CLARITIN) 10 MG tablet Take 10 mg by mouth in the morning.   LORazepam (ATIVAN) 1 MG tablet Take 1-2 mg by mouth 2 (two) times daily as needed for anxiety.   methotrexate (RHEUMATREX) 2.5 MG tablet Take 12.5 mg by mouth See admin instructions. Caution:Chemotherapy. Protect from light. On Mondays - take 5 tabs twice daily   montelukast (SINGULAIR) 10 MG tablet Take 1 tablet (10 mg total) by mouth at bedtime.   Multiple Vitamins-Minerals (CELEBRATE MULTI-COMPLETE 18 PO) Take 1 tablet by mouth daily.   pantoprazole (PROTONIX) 40 MG tablet TAKE 1 TABLET(40 MG) BY MOUTH DAILY   polyvinyl alcohol (LIQUIFILM TEARS) 1.4 % ophthalmic solution Place 1 drop into both eyes as needed for dry eyes.   No facility-administered encounter medications on file as of 04/11/2021.  :   Review of Systems:  Out of a complete 14 point review of systems, all are reviewed and negative with the exception of these symptoms as listed below:  Review  of Systems  Neurological:        Patient returns here for a sleep consult. She is here alone. She states she has been told she snores. She states she is here for fatigue. She is tired all of the time. She states she has always been a tired, fatigued person but does feel it has been worse since she had COVID last January. She states her iron and B12 were checked and this is the next step. Her ESS is 1 and FSS is 40.   Objective:  Neurological Exam  Physical Exam Physical Examination:   Vitals:   04/11/21 1246  BP: 116/78  Pulse: 60  SpO2: 98%    General Examination:  The patient is a very pleasant 50 y.o. female in no acute distress. She appears well-developed and well-nourished and well groomed.   HEENT: Normocephalic, atraumatic, pupils are equal, round and reactive to light, corrective eyeglasses in place, tracking well-preserved.  Hearing is grossly intact.  Airway examination reveals mild to moderate mouth dryness, tongue protrudes centrally and palate elevates symmetrically.  Small airway anatomy noted, Mallampati class I, neck circumference of 15- 3/8 inches.     Chest: Clear to auscultation without wheezing, rhonchi or crackles noted.   Heart: S1+S2+0, regular and normal without murmurs, rubs or gallops noted.    Abdomen: Soft, non-tender and non-distended.   Extremities: There is no obvious edema in the distal lower extremities bilaterally.   Skin: Warm and dry without trophic changes noted.    Musculoskeletal: exam reveals no obvious joint deformities.    Neurologically:  Mental status: The patient is awake, alert and oriented in all 4 spheres. Her immediate and remote memory, attention, language skills and fund of knowledge are appropriate. There is no evidence of aphasia, agnosia, apraxia or anomia. Speech is clear with normal prosody and enunciation. Thought process is linear. Mood is normal and affect is normal.  Cranial nerves II - XII are as described above under HEENT exam.  Motor exam: Normal bulk, strength and tone is noted. There is no tremor, fine motor skills and coordination are grossly intact.  Cerebellar testing: No dysmetria or intention tremor.    Sensory exam: intact to light touch.  Gait, station and balance: She stands easily. No veering to one side is noted. No leaning to one side is noted. Posture is White-appropriate and stance is narrow based. Gait shows normal stride length and normal pace. No problems turning are noted.  Assessment and Plan:    In summary, Yvette White is a very pleasant 50 y.o.-year old female with an  underlying medical history of anemia, migraine headaches, arthritis, ADD, reflux disease, thyroid disease, vitamin D deficiency, anxiety, depression, and severe obesity with a BMI of over 50, status post weight loss surgery in 2018, who presents for evaluation of her daytime tiredness.  She reports increasing fatigue for the past year.  She was previously diagnosed with abdominal mild sleep apnea via home sleep testing in 2018.  She has had weight fluctuation, she has gained weight over the past few years.  She is advised to proceed with sleep testing for reevaluation of her sleep apnea.  Given her weight increase, her obstructive sleep apnea may have indeed become worse.  She had previously been on AutoPap therapy and would be willing to try positive airway pressure treatment again.  I explained the difference between a laboratory attended sleep study versus home sleep test and she would be willing to come in for an  overnight sleep study.  We will call her to schedule her test and also keep her posted as to her test results by phone call.  We will pick up our discussion about treatment options after testing and plan to follow-up in this office accordingly.  I answered all her questions today and she was in agreement with our plan.  Thank you very much for allowing me to participate in the care of this nice patient. If I can be of any further assistance to you please do not hesitate to call me at [phone].  Sincerely,   Yvette Age, MD, PhD

## 2021-04-11 NOTE — Patient Instructions (Addendum)
It was nice to see you again today!     Here is what we discussed today and what we came up with as our plan for you:    Based on your symptoms and your exam I believe you are still at risk for obstructive sleep apnea and would benefit from reevaluation as it has been many years and you need new supplies and likely an updated machine. Therefore, I think we should proceed with a sleep study to determine how severe your sleep apnea is. If you have more than mild OSA, I want you to consider ongoing treatment with CPAP. Please remember, the risks and ramifications of moderate to severe obstructive sleep apnea or OSA are: Cardiovascular disease, including congestive heart failure, stroke, difficult to control hypertension, arrhythmias, and even type 2 diabetes has been linked to untreated OSA. Sleep apnea causes disruption of sleep and sleep deprivation in most cases, which, in turn, can cause recurrent headaches, problems with memory, mood, concentration, focus, and vigilance. Most people with untreated sleep apnea report excessive daytime sleepiness, which can affect their ability to drive. Please do not drive if you feel sleepy.   I will likely see you back after your sleep study to go over the test results and where to go from there. We will call you after your sleep study to advise about the results (most likely, you will hear from [place], my nurse) and to set up an appointment at the time, as necessary.    Our sleep lab administrative assistant will call you to schedule your sleep study. If you don't hear back from her by about 2 weeks from now, please feel free to call her at [phone]. You can leave a message with your phone number and concerns, if you get the voicemail box. She will call back as soon as possible.

## 2021-04-12 DIAGNOSIS — F4323 Adjustment disorder with mixed anxiety and depressed mood: Secondary | ICD-10-CM | POA: Diagnosis not present

## 2021-04-19 DIAGNOSIS — F4323 Adjustment disorder with mixed anxiety and depressed mood: Secondary | ICD-10-CM | POA: Diagnosis not present

## 2021-04-26 DIAGNOSIS — F4323 Adjustment disorder with mixed anxiety and depressed mood: Secondary | ICD-10-CM | POA: Diagnosis not present

## 2021-04-27 ENCOUNTER — Telehealth: Payer: Self-pay | Admitting: Neurology

## 2021-04-27 DIAGNOSIS — M9903 Segmental and somatic dysfunction of lumbar region: Secondary | ICD-10-CM | POA: Diagnosis not present

## 2021-04-27 DIAGNOSIS — M9904 Segmental and somatic dysfunction of sacral region: Secondary | ICD-10-CM | POA: Diagnosis not present

## 2021-04-27 DIAGNOSIS — M9902 Segmental and somatic dysfunction of thoracic region: Secondary | ICD-10-CM | POA: Diagnosis not present

## 2021-04-27 DIAGNOSIS — M9901 Segmental and somatic dysfunction of cervical region: Secondary | ICD-10-CM | POA: Diagnosis not present

## 2021-04-27 NOTE — Telephone Encounter (Signed)
LVM for pt to call me back to schedule sleep study  

## 2021-05-04 DIAGNOSIS — F4323 Adjustment disorder with mixed anxiety and depressed mood: Secondary | ICD-10-CM | POA: Diagnosis not present

## 2021-05-04 DIAGNOSIS — M9902 Segmental and somatic dysfunction of thoracic region: Secondary | ICD-10-CM | POA: Diagnosis not present

## 2021-05-04 DIAGNOSIS — M9901 Segmental and somatic dysfunction of cervical region: Secondary | ICD-10-CM | POA: Diagnosis not present

## 2021-05-04 DIAGNOSIS — M9903 Segmental and somatic dysfunction of lumbar region: Secondary | ICD-10-CM | POA: Diagnosis not present

## 2021-05-04 DIAGNOSIS — M9904 Segmental and somatic dysfunction of sacral region: Secondary | ICD-10-CM | POA: Diagnosis not present

## 2021-05-10 DIAGNOSIS — F5089 Other specified eating disorder: Secondary | ICD-10-CM | POA: Diagnosis not present

## 2021-05-10 DIAGNOSIS — F411 Generalized anxiety disorder: Secondary | ICD-10-CM | POA: Diagnosis not present

## 2021-05-17 DIAGNOSIS — Z20822 Contact with and (suspected) exposure to covid-19: Secondary | ICD-10-CM | POA: Diagnosis not present

## 2021-05-17 DIAGNOSIS — F411 Generalized anxiety disorder: Secondary | ICD-10-CM | POA: Diagnosis not present

## 2021-05-17 DIAGNOSIS — F5089 Other specified eating disorder: Secondary | ICD-10-CM | POA: Diagnosis not present

## 2021-05-18 ENCOUNTER — Ambulatory Visit (INDEPENDENT_AMBULATORY_CARE_PROVIDER_SITE_OTHER): Payer: BC Managed Care – PPO

## 2021-05-18 ENCOUNTER — Encounter (HOSPITAL_COMMUNITY): Payer: Self-pay

## 2021-05-18 ENCOUNTER — Other Ambulatory Visit: Payer: Self-pay

## 2021-05-18 ENCOUNTER — Ambulatory Visit (HOSPITAL_COMMUNITY)
Admission: EM | Admit: 2021-05-18 | Discharge: 2021-05-18 | Disposition: A | Discharge status: Home / Self Care | Payer: BC Managed Care – PPO | Attending: Family Medicine | Admitting: Family Medicine

## 2021-05-18 DIAGNOSIS — J069 Acute upper respiratory infection, unspecified: Secondary | ICD-10-CM

## 2021-05-18 DIAGNOSIS — R059 Cough, unspecified: Secondary | ICD-10-CM | POA: Diagnosis not present

## 2021-05-18 DIAGNOSIS — R0789 Other chest pain: Secondary | ICD-10-CM | POA: Diagnosis not present

## 2021-05-18 DIAGNOSIS — R062 Wheezing: Secondary | ICD-10-CM

## 2021-05-18 MED ORDER — HYDROCODONE BIT-HOMATROP MBR 5-1.5 MG PO TABS: 1.0000 | ORAL_TABLET | Freq: Four times a day (QID) | ORAL | 0 refills | Status: AC | PRN

## 2021-05-18 MED ORDER — PREDNISONE 20 MG PO TABS: 40.0000 mg | ORAL_TABLET | Freq: Every day | ORAL | 0 refills | Status: DC

## 2021-05-18 NOTE — ED Triage Notes (Signed)
Pt presents with c/o dry cough and heavy feeling in his chest. States she has a stuffy nose and states her congestion causes cough. States she recently went to [facility] for a COVID and FLU test. States the results have not come back.

## 2021-05-18 NOTE — ED Provider Notes (Signed)
[place]   [id number] 05/18/21 Arrival Time: 8250  ASSESSMENT & PLAN:  1. Viral URI with cough   2. Wheezing    I have personally viewed the imaging studies ordered this visit. No acute changes. No signs of PNA. Normal CXR.  Discussed typical duration of  suspected viral illness; no resp distress. Viral testing pending (from [facility]). OTC symptom care as needed.  Begin: New Prescriptions   HYDROCODONE BIT-HOMATROP MBR 5-1.5 MG TABS    Take 1 tablet by mouth every 6 (six) hours as needed for up to 7 days (for cough).   PREDNISONE (DELTASONE) 20 MG TABLET    Take 2 tablets (40 mg total) by mouth daily.   Recommend:  Follow-up Information     Caren Macadam, MD.   Specialty: Family Medicine Why: If worsening or failing to improve as anticipated. Contact information: [place] [address] [phone]                 Reviewed expectations re: course of current medical issues. Questions answered. Outlined signs and symptoms indicating need for more acute intervention. Understanding verbalized. After Visit Summary given.   SUBJECTIVE: History from: patient. Yvette White is a 50 y.o. female who reports: dry cough and "a heavy feeling" in chest when coughing; ques wheezing at times. Nasal congestion also. Afebrile. Occas blood-tinged mucous when blowing nose. Denies: current difficulty breathing. Tolerating PO intake without n/v/d.  OBJECTIVE:  Vitals:   05/18/21 1634  BP: 128/63  Pulse: 81  Resp: 17  Temp: 98.6 F (37 C)  TempSrc: Oral  SpO2: 99%    General appearance: alert; no distress Eyes: PERRLA; EOMI; conjunctiva normal HENT: [place]; AT; with nasal congestion; inflamed mucous membranes of inner nares; no active bleeding Throat: mild cobblestoning Neck: supple without LAD Lungs: speaks full sentences without difficulty; unlabored; mild to mod bilat exp wheezing present Extremities: no edema Skin: warm and  dry Neurologic: normal gait Psychological: alert and cooperative; normal mood and affect  Imaging: DG Chest 2 View  Result Date: 05/18/2021 CLINICAL DATA:  Cough, former smoker with cough for 2 days. EXAM: CHEST - 2 VIEW COMPARISON:  June 25, 2020. FINDINGS: The heart size and mediastinal contours are within normal limits. Both lungs are clear. The visualized skeletal structures are unremarkable. IMPRESSION: No active cardiopulmonary disease. Electronically Signed   By: Zetta Bills M.D.   On: 05/18/2021 17:01    Allergies  Allergen Reactions   Theophylline     Other reaction(s): Other (See Comments) Hyperactive  "Slo phylinne"   Penicillins Rash    Has patient had a PCN reaction causing immediate rash, facial/tongue/throat swelling, SOB or lightheadedness with hypotension:unsure Has patient had a PCN reaction causing severe rash involving mucus membranes or skin necrosis:unsure Has patient had a PCN reaction that required hospitalization:No Has patient had a PCN reaction occurring within the last 10 years: No If all of the above answers are "NO", then may proceed with Cephalosporin use.    Pseudoephedrine Rash   Sulfamethoxazole-Trimethoprim Rash    Past Medical History:  Diagnosis Date   ADD (attention deficit disorder), controlled with Adderall    Anemia    Anxiety    Arthritis    Chicken pox    Depression    Disorder of thyroid    GERD (gastroesophageal reflux disease)    Heart murmur    Hyperthyroidism    Menstrual disorder    uterine fibriods, polyps,cysts   Migraine  Morbid obesity ([place])    Neurocardiogenic syncope    OSA on CPAP    Pre-diabetes    Swallowing difficulty    Vitamin D deficiency    Social History   Socioeconomic History   Marital status: Single    Spouse name: Not on file   Number of children: 0   Years of education: BA   Highest education level: Not on file  Occupational History   Occupation: Freight forwarder, massage therapist,  Instructor  Tobacco Use   Smoking status: Former    Packs/day: 0.25    Years: 20.00    Pack years: 5.00    Types: Cigarettes    Start date: 1994    Quit date: 09/30/2014    Years since quitting: 6.6   Smokeless tobacco: Never  Vaping Use   Vaping Use: Never used  Substance and Sexual Activity   Alcohol use: Yes    Comment: occ   Drug use: No   Sexual activity: Not on file  Other Topics Concern   Not on file  Social History Narrative   Drinks 1-2 caffeine drinks a day. 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    Lives with her elderly mother   Right handed   Social Determinants of Health   Financial Resource Strain: Not on file  Food Insecurity: Not on file  Transportation Needs: Not on file  Physical Activity: Not on file  Stress: Not on file  Social Connections: Not on file  Intimate Partner Violence: Not on file   Family History  Problem Relation Age of Onset   Diabetes Mother    Heart disease Mother    Uterine cancer Mother    Hypertension Mother    Hyperlipidemia Mother    Kidney disease Mother    Thyroid disease Mother    Colon polyps Mother    Depression Mother    Sleep apnea Mother    Obesity Mother    Parkinsonism Mother    Heart attack Father 92   Diabetes Father    Heart disease Father    Hypertension Father    Hyperlipidemia Father    Obesity Father    Atrial fibrillation Maternal Grandmother    Parkinson's disease Maternal Grandmother    Colon cancer Maternal Grandfather 57   Heart disease Paternal Grandmother    Heart disease Paternal Grandfather    Esophageal cancer Neg Hx    Stomach cancer Neg Hx    Pancreatic cancer Neg Hx    Liver disease Neg Hx    Past Surgical History:  Procedure Laterality Date   BIOPSY EYE MUSCLE Right    COLONOSCOPY WITH PROPOFOL N/A 03/07/2021   Procedure: COLONOSCOPY WITH PROPOFOL;  Surgeon: Irene Shipper, MD;  Location: [facility];  Service: Endoscopy;  Laterality: N/A;    DILATATION & CURETTAGE/HYSTEROSCOPY WITH MYOSURE N/A 06/08/2016   Procedure: DILATATION & CURETTAGE/HYSTEROSCOPY;  Surgeon: Louretta Shorten, MD;  Location: [facility];  Service: Gynecology;  Laterality: N/A;   LAPAROSCOPIC GASTRIC SLEEVE RESECTION N/A 02/20/2017   Procedure: LAPAROSCOPIC GASTRIC SLEEVE RESECTION WITH UPPER ENDOSCOPY;  Surgeon: Johnathan Hausen, MD;  Location: [facility];  Service: General;  Laterality: N/A;   POLYPECTOMY  03/07/2021   Procedure: POLYPECTOMY;  Surgeon: Irene Shipper, MD;  Location: [facility];  Service: Endoscopy;;   TILT TABLE [place] EXTRACTION       Vanessa Kick, MD 05/18/21 1735

## 2021-05-24 DIAGNOSIS — F5089 Other specified eating disorder: Secondary | ICD-10-CM | POA: Diagnosis not present

## 2021-05-24 DIAGNOSIS — F411 Generalized anxiety disorder: Secondary | ICD-10-CM | POA: Diagnosis not present

## 2021-05-31 DIAGNOSIS — F411 Generalized anxiety disorder: Secondary | ICD-10-CM | POA: Diagnosis not present

## 2021-05-31 DIAGNOSIS — F5089 Other specified eating disorder: Secondary | ICD-10-CM | POA: Diagnosis not present

## 2021-06-02 DIAGNOSIS — F17200 Nicotine dependence, unspecified, uncomplicated: Secondary | ICD-10-CM | POA: Diagnosis not present

## 2021-06-02 DIAGNOSIS — J343 Hypertrophy of nasal turbinates: Secondary | ICD-10-CM | POA: Diagnosis not present

## 2021-06-02 DIAGNOSIS — Z8616 Personal history of COVID-19: Secondary | ICD-10-CM | POA: Diagnosis not present

## 2021-06-07 DIAGNOSIS — F411 Generalized anxiety disorder: Secondary | ICD-10-CM | POA: Diagnosis not present

## 2021-06-07 DIAGNOSIS — F5089 Other specified eating disorder: Secondary | ICD-10-CM | POA: Diagnosis not present

## 2021-06-14 DIAGNOSIS — F411 Generalized anxiety disorder: Secondary | ICD-10-CM | POA: Diagnosis not present

## 2021-06-14 DIAGNOSIS — F5089 Other specified eating disorder: Secondary | ICD-10-CM | POA: Diagnosis not present

## 2021-06-21 DIAGNOSIS — J329 Chronic sinusitis, unspecified: Secondary | ICD-10-CM | POA: Diagnosis not present

## 2021-06-22 DIAGNOSIS — F5089 Other specified eating disorder: Secondary | ICD-10-CM | POA: Diagnosis not present

## 2021-06-22 DIAGNOSIS — F411 Generalized anxiety disorder: Secondary | ICD-10-CM | POA: Diagnosis not present

## 2021-06-29 DIAGNOSIS — M9904 Segmental and somatic dysfunction of sacral region: Secondary | ICD-10-CM | POA: Diagnosis not present

## 2021-06-29 DIAGNOSIS — M9905 Segmental and somatic dysfunction of pelvic region: Secondary | ICD-10-CM | POA: Diagnosis not present

## 2021-06-29 DIAGNOSIS — M9903 Segmental and somatic dysfunction of lumbar region: Secondary | ICD-10-CM | POA: Diagnosis not present

## 2021-06-29 DIAGNOSIS — M9902 Segmental and somatic dysfunction of thoracic region: Secondary | ICD-10-CM | POA: Diagnosis not present

## 2021-06-29 DIAGNOSIS — F5089 Other specified eating disorder: Secondary | ICD-10-CM | POA: Diagnosis not present

## 2021-06-29 DIAGNOSIS — F411 Generalized anxiety disorder: Secondary | ICD-10-CM | POA: Diagnosis not present

## 2021-07-01 DIAGNOSIS — H43812 Vitreous degeneration, left eye: Secondary | ICD-10-CM | POA: Diagnosis not present

## 2021-07-06 DIAGNOSIS — R52 Pain, unspecified: Secondary | ICD-10-CM | POA: Diagnosis not present

## 2021-07-06 DIAGNOSIS — F411 Generalized anxiety disorder: Secondary | ICD-10-CM | POA: Diagnosis not present

## 2021-07-06 DIAGNOSIS — R059 Cough, unspecified: Secondary | ICD-10-CM | POA: Diagnosis not present

## 2021-07-06 DIAGNOSIS — F5089 Other specified eating disorder: Secondary | ICD-10-CM | POA: Diagnosis not present

## 2021-07-06 DIAGNOSIS — R5383 Other fatigue: Secondary | ICD-10-CM | POA: Diagnosis not present

## 2021-07-06 DIAGNOSIS — R0981 Nasal congestion: Secondary | ICD-10-CM | POA: Diagnosis not present

## 2021-07-11 DIAGNOSIS — H43811 Vitreous degeneration, right eye: Secondary | ICD-10-CM | POA: Diagnosis not present

## 2021-07-11 DIAGNOSIS — H43812 Vitreous degeneration, left eye: Secondary | ICD-10-CM | POA: Diagnosis not present

## 2021-07-11 DIAGNOSIS — H05121 Orbital myositis, right orbit: Secondary | ICD-10-CM | POA: Diagnosis not present

## 2021-07-11 IMAGING — DX DG CHEST 2V
1 series · 1 of 1 positions shown · non-contrast
Comparison: No pertinent prior exams available for comparison.

CLINICAL DATA: [id number]. Cough; post COVID. Additional history
provided by technologist: Follow-up from COVID, cough and nasal
congestion.

EXAM:
CHEST - 2 VIEW

[chest pa]
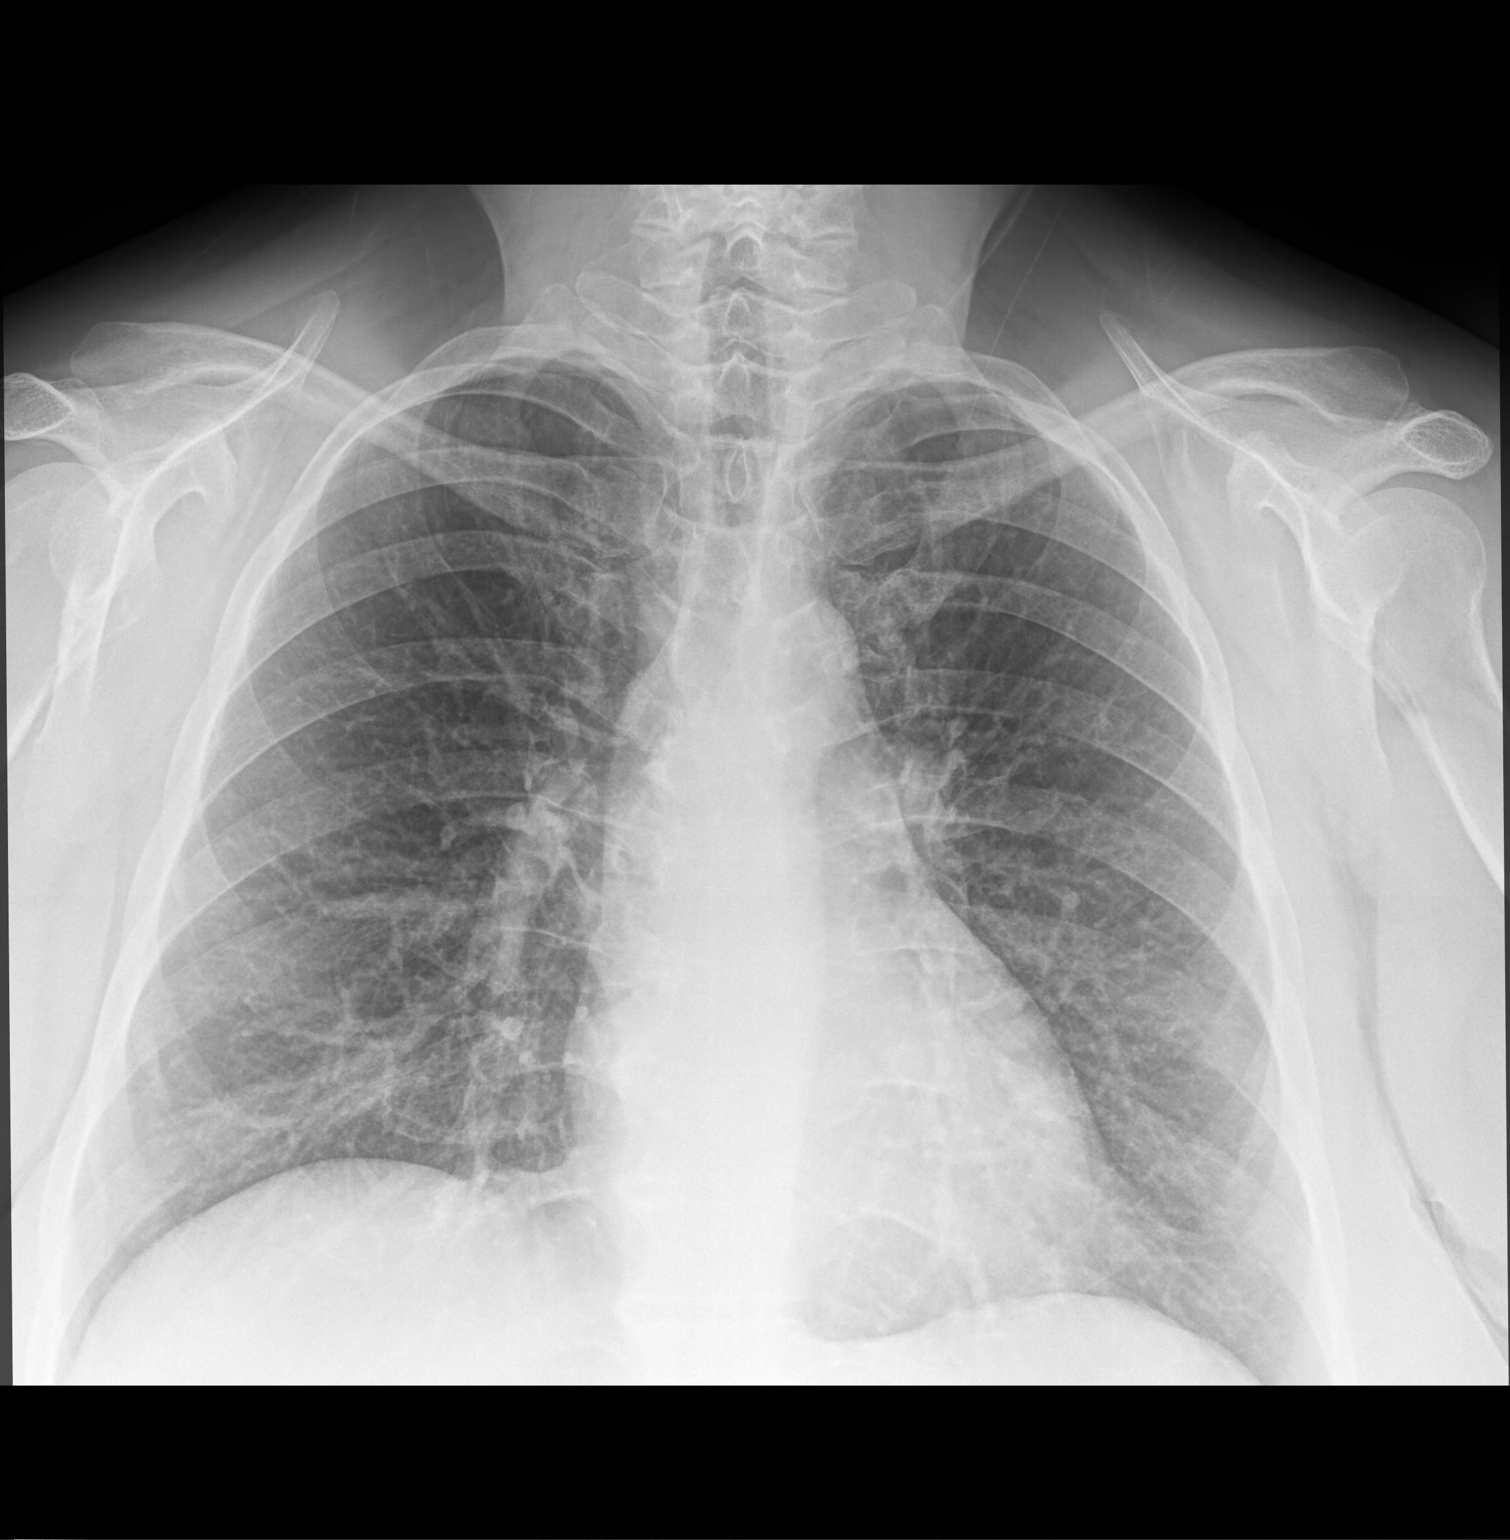

[1 of 1 positions shown; findings below may reference images not displayed]

FINDINGS: Heart size within normal limits.

No appreciable airspace consolidation.No evidence of pleural
effusion or pneumothorax.

No acute bony abnormality identified.
IMPRESSION: No evidence of active cardiopulmonary disease.

## 2021-07-13 DIAGNOSIS — F411 Generalized anxiety disorder: Secondary | ICD-10-CM | POA: Diagnosis not present

## 2021-07-13 DIAGNOSIS — F5089 Other specified eating disorder: Secondary | ICD-10-CM | POA: Diagnosis not present

## 2021-07-19 ENCOUNTER — Telehealth: Payer: Self-pay

## 2021-07-19 NOTE — Telephone Encounter (Signed)
Returned pt's call and LVM for pt to call me back to schedule sleep study ° °

## 2021-07-20 DIAGNOSIS — F411 Generalized anxiety disorder: Secondary | ICD-10-CM | POA: Diagnosis not present

## 2021-07-20 DIAGNOSIS — F5089 Other specified eating disorder: Secondary | ICD-10-CM | POA: Diagnosis not present

## 2021-07-21 DIAGNOSIS — F40298 Other specified phobia: Secondary | ICD-10-CM | POA: Diagnosis not present

## 2021-07-21 DIAGNOSIS — Z79899 Other long term (current) drug therapy: Secondary | ICD-10-CM | POA: Diagnosis not present

## 2021-07-21 DIAGNOSIS — H0510 Unspecified chronic inflammatory disorders of orbit: Secondary | ICD-10-CM | POA: Diagnosis not present

## 2021-07-27 DIAGNOSIS — F411 Generalized anxiety disorder: Secondary | ICD-10-CM | POA: Diagnosis not present

## 2021-07-27 DIAGNOSIS — F5089 Other specified eating disorder: Secondary | ICD-10-CM | POA: Diagnosis not present

## 2021-08-03 DIAGNOSIS — F411 Generalized anxiety disorder: Secondary | ICD-10-CM | POA: Diagnosis not present

## 2021-08-03 DIAGNOSIS — F5089 Other specified eating disorder: Secondary | ICD-10-CM | POA: Diagnosis not present

## 2021-08-10 DIAGNOSIS — F411 Generalized anxiety disorder: Secondary | ICD-10-CM | POA: Diagnosis not present

## 2021-08-10 DIAGNOSIS — F5089 Other specified eating disorder: Secondary | ICD-10-CM | POA: Diagnosis not present

## 2021-08-17 ENCOUNTER — Ambulatory Visit (INDEPENDENT_AMBULATORY_CARE_PROVIDER_SITE_OTHER): Payer: BC Managed Care – PPO | Admitting: Neurology

## 2021-08-17 DIAGNOSIS — R519 Headache, unspecified: Secondary | ICD-10-CM

## 2021-08-17 DIAGNOSIS — G4733 Obstructive sleep apnea (adult) (pediatric): Secondary | ICD-10-CM

## 2021-08-17 DIAGNOSIS — G4719 Other hypersomnia: Secondary | ICD-10-CM

## 2021-08-17 DIAGNOSIS — R635 Abnormal weight gain: Secondary | ICD-10-CM

## 2021-08-19 DIAGNOSIS — H0510 Unspecified chronic inflammatory disorders of orbit: Secondary | ICD-10-CM | POA: Diagnosis not present

## 2021-08-19 DIAGNOSIS — Z79899 Other long term (current) drug therapy: Secondary | ICD-10-CM | POA: Diagnosis not present

## 2021-08-22 NOTE — Progress Notes (Signed)
See procedure note.

## 2021-08-23 NOTE — Procedures (Signed)
? ?  [facility] ? ?HOME SLEEP TEST (Watch PAT) REPORT ? ?STUDY DATE: 08/18/2021 ? ?DOB: September 09, 1970 ? ?MRN: [id number] ? ?ORDERING CLINICIAN: [name], MD, PhD ?  ?REFERRING CLINICIAN: Alyssa Allwardt, PA ? ?CLINICAL INFORMATION/HISTORY: 51 year old right-handed woman with an underlying medical history of anemia, migraine headaches, arthritis, ADD, reflux disease, thyroid disease, vitamin D deficiency, anxiety, depression, and severe obesity with a BMI of over 50, status post weight loss surgery in 2018, who presents for reevaluation of her obstructive sleep apnea.   ? ?Epworth sleepiness score: 1/24. ? ?BMI: 51.4 kg/m? ? ?FINDINGS:  ? ?Sleep Summary:  ? ?Total Recording Time (hours, min): 8 hours, 42 minutes ? ?Total Sleep Time (hours, min):  7 hours, 58 minutes  ? ?Percent REM (%):    16.6%  ? ?Respiratory Indices:  ? ?Calculated pAHI (per hour):  10.8/hour        ? ?REM pAHI:    10.9/hour      ? ?NREM pAHI: 10.8/hour ? ?Oxygen Saturation Statistics:  ?  ?Oxygen Saturation (%) Mean: 94%  ? ?Minimum oxygen saturation (%):                 75%  ? ?O2 Saturation Range (%): 75-99%   ? ?O2 Saturation (minutes) <=88%: 0.2 min ? ?Pulse Rate Statistics:  ? ?Pulse Mean (bpm):    59/min   ? ?Pulse Range (44-88/min)  ? ?IMPRESSION: OSA (obstructive sleep apnea), mild ? ?RECOMMENDATION:  ?This home sleep test demonstrates overall mild obstructive sleep apnea with a total AHI of 10.8/hour and O2 nadir of 75% briefly.  Fairly consistent snoring was detected throughout the study, mostly in the moderate range, at times mildly, at times in the moderate range.  Given the patient's medical history and sleep related complaints, treatment with positive airway pressure can be considered in the form of AutoPap treatment.  Alternative treatment options may include weight loss along with avoidance of the supine sleep position or an oral appliance in appropriate candidates.  ?Please note that untreated obstructive sleep  apnea may carry additional perioperative morbidity. Patients with significant obstructive sleep apnea should receive perioperative PAP therapy and the surgeons and particularly the anesthesiologist should be informed of the diagnosis and the severity of the sleep disordered breathing. ?The patient should be cautioned not to drive, work at heights, or operate dangerous or heavy equipment when tired or sleepy. Review and reiteration of good sleep hygiene measures should be pursued with any patient. ?Other causes of the patient's symptoms, including circadian rhythm disturbances, an underlying mood disorder, medication effect and/or an underlying medical problem cannot be ruled out based on this test. Clinical correlation is recommended.  ? ?The patient and her referring provider will be notified of the test results. The patient will be seen in follow up in sleep clinic at [facility]. ? ?I certify that I have reviewed the raw data recording prior to the issuance of this report in accordance with the standards of the American Academy of Sleep Medicine (AASM). ? ?INTERPRETING PHYSICIAN:  ? ?[name], MD, PhD  ?Board Certified in Neurology and Sleep Medicine ? ?[facility] ?[address], [address] ?[place], [place] [address] ?[phone] ? ? ? ? ? ? ? ? ? ? ? ? ? ? ? ? ? ? ? ? ?

## 2021-08-25 ENCOUNTER — Telehealth: Payer: Self-pay | Admitting: *Deleted

## 2021-08-25 DIAGNOSIS — D2261 Melanocytic nevi of right upper limb, including shoulder: Secondary | ICD-10-CM | POA: Diagnosis not present

## 2021-08-25 DIAGNOSIS — G4733 Obstructive sleep apnea (adult) (pediatric): Secondary | ICD-10-CM

## 2021-08-25 DIAGNOSIS — D485 Neoplasm of uncertain behavior of skin: Secondary | ICD-10-CM | POA: Diagnosis not present

## 2021-08-25 DIAGNOSIS — D171 Benign lipomatous neoplasm of skin and subcutaneous tissue of trunk: Secondary | ICD-10-CM | POA: Diagnosis not present

## 2021-08-25 DIAGNOSIS — D225 Melanocytic nevi of trunk: Secondary | ICD-10-CM | POA: Diagnosis not present

## 2021-08-25 NOTE — Telephone Encounter (Signed)
-----   Message from [name], MD sent at 08/23/2021  5:16 PM EDT ----- ?Patient referred by primary care PA, for reevaluation of her obstructive sleep apnea, seen by me on 04/11/2021, HST 08/18/2021.   ?Please call and notify the patient that the recent home sleep test showed/confirmed mild obstructive sleep apnea.  She may qualify for a new machine, she had a CPAP or AutoPap machine in the past, if she would like to pursue treatment with a PAP machine, I would be happy to write for AutoPap therapy.  Alternative treatment options may include weight loss and avoidance of the back sleep position or an oral appliance.  If she would rather consider dentistry referral for oral appliance treatment, I would be happy to make a referral.  Please let me know how she would like to proceed.  ?

## 2021-08-25 NOTE — Telephone Encounter (Signed)
Spoke with patient and discussed sleep study results.  Patient would like to pursue therapy with a new AutoPap machine.  We discussed the difference between AutoPap and CPAP.  Patient is aware of the alternative options for treatment that she will consider if unable to start AutoPap for some reason.  We discussed options for DME company.  She does not wish to return to [employer].  She will use [facility] and she is aware they will give her a call next week to discuss set up pending [place] faxing orders to them.  Patient aware of insurance compliance requirements which includes using the machine at least 4 hours at night and also being seen in the office for an initial follow-up between 31 and 90 days after set up.  Patient went ahead and scheduled an initial follow-up with Dr. Rexene Alberts on June 20 at 1:15 PM arrival at least 15 minutes early with machine and power cord.  The patient verbalized appreciation for the call and her questions were answered. ? ?Letter will be sent to patient via mychart.  ?

## 2021-08-26 NOTE — Telephone Encounter (Signed)
New autoPAP order placed.  ?

## 2021-08-26 NOTE — Addendum Note (Signed)
Addended by: [name] on: 08/26/2021 07:44 AM ? ? Modules accepted: Orders ? ?

## 2021-08-29 NOTE — Telephone Encounter (Signed)
Thanks! Referral (office note, autopap order, sleep study, insurance/demographic info) faxed to [facility]. Received a receipt of confirmation. ? ?

## 2021-08-31 DIAGNOSIS — F5089 Other specified eating disorder: Secondary | ICD-10-CM | POA: Diagnosis not present

## 2021-08-31 DIAGNOSIS — F411 Generalized anxiety disorder: Secondary | ICD-10-CM | POA: Diagnosis not present

## 2021-09-05 DIAGNOSIS — H43811 Vitreous degeneration, right eye: Secondary | ICD-10-CM | POA: Diagnosis not present

## 2021-09-05 DIAGNOSIS — H43812 Vitreous degeneration, left eye: Secondary | ICD-10-CM | POA: Diagnosis not present

## 2021-09-05 DIAGNOSIS — H43813 Vitreous degeneration, bilateral: Secondary | ICD-10-CM | POA: Diagnosis not present

## 2021-09-07 DIAGNOSIS — F5089 Other specified eating disorder: Secondary | ICD-10-CM | POA: Diagnosis not present

## 2021-09-07 DIAGNOSIS — F411 Generalized anxiety disorder: Secondary | ICD-10-CM | POA: Diagnosis not present

## 2021-09-09 ENCOUNTER — Encounter (HOSPITAL_COMMUNITY): Payer: Self-pay | Admitting: *Deleted

## 2021-09-13 ENCOUNTER — Telehealth: Payer: Self-pay | Admitting: *Deleted

## 2021-09-13 NOTE — Telephone Encounter (Signed)
Received fax from [facility] that they have attempted to reach pt for PAP therapy set up.  They will mail letter to pt as well.  I relayed to pt about this in VM and left  her  their number [phone]. Will send mychart message as well.  ?

## 2021-09-14 DIAGNOSIS — F5089 Other specified eating disorder: Secondary | ICD-10-CM | POA: Diagnosis not present

## 2021-09-14 DIAGNOSIS — F411 Generalized anxiety disorder: Secondary | ICD-10-CM | POA: Diagnosis not present

## 2021-09-21 DIAGNOSIS — F411 Generalized anxiety disorder: Secondary | ICD-10-CM | POA: Diagnosis not present

## 2021-09-21 DIAGNOSIS — F5089 Other specified eating disorder: Secondary | ICD-10-CM | POA: Diagnosis not present

## 2021-09-21 DIAGNOSIS — F3189 Other bipolar disorder: Secondary | ICD-10-CM | POA: Diagnosis not present

## 2021-09-21 DIAGNOSIS — F900 Attention-deficit hyperactivity disorder, predominantly inattentive type: Secondary | ICD-10-CM | POA: Diagnosis not present

## 2021-09-29 ENCOUNTER — Telehealth: Payer: Self-pay | Admitting: *Deleted

## 2021-09-29 NOTE — Telephone Encounter (Signed)
I called pt and LMVM for her that [place] [facility] trying to reach you , mailing letter.  I also sent mychart message about this.  Just following up.  Call us back if needed.  ?

## 2021-10-05 DIAGNOSIS — F5089 Other specified eating disorder: Secondary | ICD-10-CM | POA: Diagnosis not present

## 2021-10-05 DIAGNOSIS — F411 Generalized anxiety disorder: Secondary | ICD-10-CM | POA: Diagnosis not present

## 2021-10-13 DIAGNOSIS — L988 Other specified disorders of the skin and subcutaneous tissue: Secondary | ICD-10-CM | POA: Diagnosis not present

## 2021-10-13 DIAGNOSIS — D485 Neoplasm of uncertain behavior of skin: Secondary | ICD-10-CM | POA: Diagnosis not present

## 2021-10-17 ENCOUNTER — Ambulatory Visit: Payer: BC Managed Care – PPO | Admitting: Podiatry

## 2021-10-17 DIAGNOSIS — L600 Ingrowing nail: Secondary | ICD-10-CM

## 2021-10-17 NOTE — Progress Notes (Signed)
Subjective: Patient presents today for evaluation of pain to the lateral border left great toe. Patient is concerned for possible ingrown nail.  Patient does have history of partial nail matricectomy's to the medial border of the bilateral great toes as well as the lateral border of the right great toe.  This occurred 5+ years ago by [facility].  It is very sensitive to touch.  Patient presents today for further treatment and evaluation.  Past Medical History:  Diagnosis Date   ADD (attention deficit disorder), controlled with Adderall    Anemia    Anxiety    Arthritis    Chicken pox    Depression    Disorder of thyroid    GERD (gastroesophageal reflux disease)    Heart murmur    Hyperthyroidism    Menstrual disorder    uterine fibriods, polyps,cysts   Migraine    Morbid obesity ([place])    Neurocardiogenic syncope    OSA on CPAP    Pre-diabetes    Swallowing difficulty    Vitamin D deficiency    Past Surgical History:  Procedure Laterality Date   BIOPSY EYE MUSCLE Right    COLONOSCOPY WITH PROPOFOL N/A 03/07/2021   Procedure: COLONOSCOPY WITH PROPOFOL;  Surgeon: Irene Shipper, MD;  Location: [facility];  Service: Endoscopy;  Laterality: N/A;   DILATATION & CURETTAGE/HYSTEROSCOPY WITH MYOSURE N/A 06/08/2016   Procedure: DILATATION & CURETTAGE/HYSTEROSCOPY;  Surgeon: Louretta Shorten, MD;  Location: [facility];  Service: Gynecology;  Laterality: N/A;   LAPAROSCOPIC GASTRIC SLEEVE RESECTION N/A 02/20/2017   Procedure: LAPAROSCOPIC GASTRIC SLEEVE RESECTION WITH UPPER ENDOSCOPY;  Surgeon: Johnathan Hausen, MD;  Location: [facility];  Service: General;  Laterality: N/A;   POLYPECTOMY  03/07/2021   Procedure: POLYPECTOMY;  Surgeon: Irene Shipper, MD;  Location: [facility];  Service: Endoscopy;;   TILT TABLE STUDY     URETHRAL DILATION  1979   WISDOM TOOTH EXTRACTION     Allergies  Allergen Reactions   Theophylline     Other reaction(s): Other (See Comments) Hyperactive  "Slo  phylinne"   Penicillins Rash    Has patient had a PCN reaction causing immediate rash, facial/tongue/throat swelling, SOB or lightheadedness with hypotension:unsure Has patient had a PCN reaction causing severe rash involving mucus membranes or skin necrosis:unsure Has patient had a PCN reaction that required hospitalization:No Has patient had a PCN reaction occurring within the last 10 years: No If all of the above answers are "NO", then may proceed with Cephalosporin use.    Pseudoephedrine Rash   Sulfamethoxazole-Trimethoprim Rash    Objective:  General: Well developed, nourished, in no acute distress, alert and oriented x3   Dermatology: Skin is warm, dry and supple bilateral.  Lateral border left great toe appears to be erythematous with evidence of an ingrowing nail. Pain on palpation noted to the border of the nail fold. The remaining nails appear unremarkable at this time. There are no open sores, lesions.  Vascular: Dorsalis Pedis artery and Posterior Tibial artery pedal pulses palpable. No lower extremity edema noted.   Neruologic: Grossly intact via light touch bilateral.  Musculoskeletal: Muscular strength within normal limits in all groups bilateral. Normal range of motion noted to all pedal and ankle joints.   Assesement: #1 Paronychia with ingrowing nail lateral border left great toe #2 Pain in toe  Plan of Care:  1. Patient evaluated.  2. Discussed treatment alternatives and plan of care. Explained nail avulsion procedure and post procedure course to patient.  3. Patient opted for permanent partial nail avulsion of the ingrown portion of the nail.  4. Prior to procedure, local anesthesia infiltration utilized using 3 ml of a 50:50 mixture of 2% plain lidocaine and 0.5% plain marcaine in a normal hallux block fashion and a betadine prep performed.  5. Partial permanent nail avulsion with chemical matrixectomy performed using [id number] applications of phenol followed by  alcohol flush.  6. Light dressing applied.  Post care instructions provided 7.  Patient currently has a prescription strength antibiotic ointment.  Recommend applying daily with a Band-Aid 8.  Return to clinic 2 weeks for follow-up.  Edrick Kins, DPM [facility]  Dr. Edrick Kins, DPM    [address], [place] [address]                Office [phone]  Fax [phone]

## 2021-10-17 NOTE — Patient Instructions (Signed)

## 2021-10-19 DIAGNOSIS — F411 Generalized anxiety disorder: Secondary | ICD-10-CM | POA: Diagnosis not present

## 2021-10-19 DIAGNOSIS — F5089 Other specified eating disorder: Secondary | ICD-10-CM | POA: Diagnosis not present

## 2021-10-26 DIAGNOSIS — F411 Generalized anxiety disorder: Secondary | ICD-10-CM | POA: Diagnosis not present

## 2021-10-26 DIAGNOSIS — F5089 Other specified eating disorder: Secondary | ICD-10-CM | POA: Diagnosis not present

## 2021-10-31 ENCOUNTER — Ambulatory Visit: Payer: BC Managed Care – PPO | Admitting: Podiatry

## 2021-11-09 DIAGNOSIS — F411 Generalized anxiety disorder: Secondary | ICD-10-CM | POA: Diagnosis not present

## 2021-11-09 DIAGNOSIS — F5089 Other specified eating disorder: Secondary | ICD-10-CM | POA: Diagnosis not present

## 2021-11-11 ENCOUNTER — Ambulatory Visit: Payer: BC Managed Care – PPO | Admitting: Podiatry

## 2021-11-11 ENCOUNTER — Telehealth: Payer: Self-pay

## 2021-11-11 DIAGNOSIS — L600 Ingrowing nail: Secondary | ICD-10-CM

## 2021-11-11 MED ORDER — DOXYCYCLINE HYCLATE 100 MG PO TABS: 100.0000 mg | ORAL_TABLET | Freq: Two times a day (BID) | ORAL | 0 refills | Status: DC

## 2021-11-11 MED ORDER — GENTAMICIN SULFATE 0.1 % EX CREA: 1.0000 | TOPICAL_CREAM | Freq: Two times a day (BID) | CUTANEOUS | 1 refills | Status: DC

## 2021-11-11 NOTE — Telephone Encounter (Signed)
Patient calling to make sure her prescriptions are sent to the [facility], since it is the weekend

## 2021-11-11 NOTE — Progress Notes (Signed)
   Subjective: 51 y.o. female presents today status post permanent nail avulsion procedure of the lateral border left great toe that was performed on 10/17/2021.  Patient states that the ingrown toenail is doing very well and healing nicely.  She no longer has any pain associated to the area.  She is concerned because she believes that she developed some itching with the rash to the bilateral aspect of the dorsum of the foot secondary to the soaking with the Epsom salt.  It was very itchy and she began scratching the top of her feet and developed excoriations.  She is requesting to have it evaluated and treated today.   Past Medical History:  Diagnosis Date   ADD (attention deficit disorder), controlled with Adderall    Anemia    Anxiety    Arthritis    Chicken pox    Depression    Disorder of thyroid    GERD (gastroesophageal reflux disease)    Heart murmur    Hyperthyroidism    Menstrual disorder    uterine fibriods, polyps,cysts   Migraine    Morbid obesity ([place])    Neurocardiogenic syncope    OSA on CPAP    Pre-diabetes    Swallowing difficulty    Vitamin D deficiency     Objective: Skin is warm, dry and supple. Nail and respective nail fold appears to be healing appropriately.  No drainage.  No tenderness to palpation  Superficial excoriations noted secondary to scratching and itching with overlying crust noted to the dorsum of the bilateral feet.  The left actually has some slight purulence to the area.  No malodor.  No erythema except for very localized around these wounds/excoriations  Assessment: #1 s/p partial permanent nail matrixectomy lateral border left great toe #2 superficial wounds/excoriations secondary to itching from possible Epsom salt soaks   Plan of care: #1 patient was evaluated  #2 light debridement of open wound was performed to the periungual border of the respective toe using a currette. Antibiotic ointment and Band-Aid was applied. #3  Prescription  for gentamicin cream and doxycycline 100 mg 2 times daily #20 sent to the pharmacy for the superficial wounds to the dorsum of the bilateral feet #4 patient is to return to clinic on a PRN basis.   Edrick Kins, DPM [facility]  Dr. Edrick Kins, DPM    [address], [place] [address]                Office [phone]  Fax [phone]

## 2021-11-11 NOTE — Telephone Encounter (Signed)
Patient calling to see if prescriptions have been sent to the [facility] in [facility]

## 2021-11-15 ENCOUNTER — Ambulatory Visit: Payer: BC Managed Care – PPO | Admitting: Neurology

## 2021-11-16 ENCOUNTER — Ambulatory Visit: Payer: BC Managed Care – PPO | Admitting: Podiatry

## 2021-11-23 DIAGNOSIS — F5089 Other specified eating disorder: Secondary | ICD-10-CM | POA: Diagnosis not present

## 2021-11-23 DIAGNOSIS — F411 Generalized anxiety disorder: Secondary | ICD-10-CM | POA: Diagnosis not present

## 2021-11-30 DIAGNOSIS — F411 Generalized anxiety disorder: Secondary | ICD-10-CM | POA: Diagnosis not present

## 2021-11-30 DIAGNOSIS — F5089 Other specified eating disorder: Secondary | ICD-10-CM | POA: Diagnosis not present

## 2021-12-14 DIAGNOSIS — F411 Generalized anxiety disorder: Secondary | ICD-10-CM | POA: Diagnosis not present

## 2021-12-14 DIAGNOSIS — F5089 Other specified eating disorder: Secondary | ICD-10-CM | POA: Diagnosis not present

## 2021-12-19 ENCOUNTER — Telehealth: Payer: Self-pay | Admitting: *Deleted

## 2021-12-19 ENCOUNTER — Encounter: Payer: Self-pay | Admitting: *Deleted

## 2021-12-19 NOTE — Chronic Care Management (AMB) (Signed)
  Care Coordination  Note  12/19/2021 Name: CHRISLYNN MOSELY MRN: [id number] DOB: Jul 17, 1970  ELLANIE OPPEDISANO is a 51 y.o. year old female who is a primary care patient of Hilts, Legrand Como, MD. I reached out to Sharlyne Cai by phone today to offer care coordination services.       Follow up plan: Unsuccessful telephone outreach attempt made. A HIPAA compliant phone message was left for the patient providing contact information and requesting a return call.  The care guide will reach out to the patient again over the next 7 days.  If patient calls provider office to request assistance with care coordination needs, please contact the care guide at the number below.   [place]  Direct Dial: [phone]

## 2021-12-28 DIAGNOSIS — F411 Generalized anxiety disorder: Secondary | ICD-10-CM | POA: Diagnosis not present

## 2021-12-28 DIAGNOSIS — F5089 Other specified eating disorder: Secondary | ICD-10-CM | POA: Diagnosis not present

## 2022-01-09 NOTE — Chronic Care Management (AMB) (Unsigned)
  Care Coordination  Outreach Note  01/09/2022 Name: Yvette White MRN: [id number] DOB: [birth year]   Care Coordination Outreach Attempts  A second unsuccessful outreach was attempted today to offer the patient with information about available care coordination services as a benefit of their health plan.     Follow Up Plan:  Additional outreach attempts will be made to offer the patient care coordination information and services.   Encounter Outcome:  No Answer   [place]  Direct Dial: [phone]

## 2022-01-11 DIAGNOSIS — F5089 Other specified eating disorder: Secondary | ICD-10-CM | POA: Diagnosis not present

## 2022-01-11 DIAGNOSIS — F411 Generalized anxiety disorder: Secondary | ICD-10-CM | POA: Diagnosis not present

## 2022-01-12 NOTE — Chronic Care Management (AMB) (Signed)
  Care Coordination  Outreach Note  01/12/2022 Name: Yvette White MRN: [id number] DOB: 1971-03-08   Care Coordination Outreach Attempts  A third unsuccessful outreach was attempted today to offer the patient with information about available care coordination services as a benefit of their health plan.   Follow Up Plan:  No further outreach attempts will be made at this time. We have been unable to contact the patient to offer or enroll patient in care coordination services  Encounter Outcome:  No Answer   [place]: [phone]

## 2022-01-18 DIAGNOSIS — F5089 Other specified eating disorder: Secondary | ICD-10-CM | POA: Diagnosis not present

## 2022-01-18 DIAGNOSIS — F411 Generalized anxiety disorder: Secondary | ICD-10-CM | POA: Diagnosis not present

## 2022-01-25 DIAGNOSIS — F5089 Other specified eating disorder: Secondary | ICD-10-CM | POA: Diagnosis not present

## 2022-01-25 DIAGNOSIS — F411 Generalized anxiety disorder: Secondary | ICD-10-CM | POA: Diagnosis not present

## 2022-02-15 DIAGNOSIS — F5089 Other specified eating disorder: Secondary | ICD-10-CM | POA: Diagnosis not present

## 2022-02-15 DIAGNOSIS — F411 Generalized anxiety disorder: Secondary | ICD-10-CM | POA: Diagnosis not present

## 2022-02-22 DIAGNOSIS — M62838 Other muscle spasm: Secondary | ICD-10-CM | POA: Diagnosis not present

## 2022-02-22 DIAGNOSIS — M546 Pain in thoracic spine: Secondary | ICD-10-CM | POA: Diagnosis not present

## 2022-02-22 DIAGNOSIS — M9902 Segmental and somatic dysfunction of thoracic region: Secondary | ICD-10-CM | POA: Diagnosis not present

## 2022-02-22 DIAGNOSIS — M9901 Segmental and somatic dysfunction of cervical region: Secondary | ICD-10-CM | POA: Diagnosis not present

## 2022-02-27 DIAGNOSIS — M62838 Other muscle spasm: Secondary | ICD-10-CM | POA: Diagnosis not present

## 2022-02-27 DIAGNOSIS — Z79899 Other long term (current) drug therapy: Secondary | ICD-10-CM | POA: Diagnosis not present

## 2022-02-27 DIAGNOSIS — M9901 Segmental and somatic dysfunction of cervical region: Secondary | ICD-10-CM | POA: Diagnosis not present

## 2022-02-27 DIAGNOSIS — H0510 Unspecified chronic inflammatory disorders of orbit: Secondary | ICD-10-CM | POA: Diagnosis not present

## 2022-02-27 DIAGNOSIS — M546 Pain in thoracic spine: Secondary | ICD-10-CM | POA: Diagnosis not present

## 2022-02-27 DIAGNOSIS — M9902 Segmental and somatic dysfunction of thoracic region: Secondary | ICD-10-CM | POA: Diagnosis not present

## 2022-03-01 DIAGNOSIS — F5089 Other specified eating disorder: Secondary | ICD-10-CM | POA: Diagnosis not present

## 2022-03-01 DIAGNOSIS — F4310 Post-traumatic stress disorder, unspecified: Secondary | ICD-10-CM | POA: Diagnosis not present

## 2022-03-01 DIAGNOSIS — F4323 Adjustment disorder with mixed anxiety and depressed mood: Secondary | ICD-10-CM | POA: Diagnosis not present

## 2022-03-14 DIAGNOSIS — F411 Generalized anxiety disorder: Secondary | ICD-10-CM | POA: Diagnosis not present

## 2022-03-15 DIAGNOSIS — F411 Generalized anxiety disorder: Secondary | ICD-10-CM | POA: Diagnosis not present

## 2022-03-15 DIAGNOSIS — F5089 Other specified eating disorder: Secondary | ICD-10-CM | POA: Diagnosis not present

## 2022-03-22 DIAGNOSIS — F411 Generalized anxiety disorder: Secondary | ICD-10-CM | POA: Diagnosis not present

## 2022-03-22 DIAGNOSIS — F5089 Other specified eating disorder: Secondary | ICD-10-CM | POA: Diagnosis not present

## 2022-03-29 DIAGNOSIS — F5089 Other specified eating disorder: Secondary | ICD-10-CM | POA: Diagnosis not present

## 2022-03-29 DIAGNOSIS — F411 Generalized anxiety disorder: Secondary | ICD-10-CM | POA: Diagnosis not present

## 2022-03-30 DIAGNOSIS — F900 Attention-deficit hyperactivity disorder, predominantly inattentive type: Secondary | ICD-10-CM | POA: Diagnosis not present

## 2022-03-30 DIAGNOSIS — F341 Dysthymic disorder: Secondary | ICD-10-CM | POA: Diagnosis not present

## 2022-04-03 DIAGNOSIS — M62838 Other muscle spasm: Secondary | ICD-10-CM | POA: Diagnosis not present

## 2022-04-03 DIAGNOSIS — M9902 Segmental and somatic dysfunction of thoracic region: Secondary | ICD-10-CM | POA: Diagnosis not present

## 2022-04-03 DIAGNOSIS — M9901 Segmental and somatic dysfunction of cervical region: Secondary | ICD-10-CM | POA: Diagnosis not present

## 2022-04-03 DIAGNOSIS — M546 Pain in thoracic spine: Secondary | ICD-10-CM | POA: Diagnosis not present

## 2022-04-03 DIAGNOSIS — H0510 Unspecified chronic inflammatory disorders of orbit: Secondary | ICD-10-CM | POA: Diagnosis not present

## 2022-04-03 DIAGNOSIS — Z79899 Other long term (current) drug therapy: Secondary | ICD-10-CM | POA: Diagnosis not present

## 2022-04-04 DIAGNOSIS — F411 Generalized anxiety disorder: Secondary | ICD-10-CM | POA: Diagnosis not present

## 2022-04-05 DIAGNOSIS — F5089 Other specified eating disorder: Secondary | ICD-10-CM | POA: Diagnosis not present

## 2022-04-05 DIAGNOSIS — F411 Generalized anxiety disorder: Secondary | ICD-10-CM | POA: Diagnosis not present

## 2022-04-17 DIAGNOSIS — F5089 Other specified eating disorder: Secondary | ICD-10-CM | POA: Diagnosis not present

## 2022-04-17 DIAGNOSIS — F411 Generalized anxiety disorder: Secondary | ICD-10-CM | POA: Diagnosis not present

## 2022-04-18 DIAGNOSIS — F411 Generalized anxiety disorder: Secondary | ICD-10-CM | POA: Diagnosis not present

## 2022-04-26 DIAGNOSIS — F411 Generalized anxiety disorder: Secondary | ICD-10-CM | POA: Diagnosis not present

## 2022-04-26 DIAGNOSIS — F5089 Other specified eating disorder: Secondary | ICD-10-CM | POA: Diagnosis not present

## 2022-04-27 ENCOUNTER — Ambulatory Visit: Payer: BC Managed Care – PPO | Admitting: Podiatry

## 2022-04-27 DIAGNOSIS — L600 Ingrowing nail: Secondary | ICD-10-CM | POA: Diagnosis not present

## 2022-04-27 NOTE — Patient Instructions (Signed)

## 2022-04-28 NOTE — Progress Notes (Signed)
Subjective:   Patient ID: Yvette White, female   DOB: 51 y.o.   MRN: [id number]   HPI Patient states that she is got a thickened nail left hallux and it is painful in the corners and she is leaving the country in 2 days and wants to see if there is anything we can do   ROS      Objective:  Physical Exam  Thick brittle nail bed with incurvation left hallux     Assessment:  Chronic ingrown toenail deformity left hallux with history of the quarters but the entire nailbed itself is probably affected     Plan:  Reviewed possibility for nail removal in the long run but at this point debridement and nailbeds carefully taken the corners reappoint routine care

## 2022-05-31 DIAGNOSIS — M9904 Segmental and somatic dysfunction of sacral region: Secondary | ICD-10-CM | POA: Diagnosis not present

## 2022-05-31 DIAGNOSIS — M9902 Segmental and somatic dysfunction of thoracic region: Secondary | ICD-10-CM | POA: Diagnosis not present

## 2022-05-31 DIAGNOSIS — M9901 Segmental and somatic dysfunction of cervical region: Secondary | ICD-10-CM | POA: Diagnosis not present

## 2022-05-31 DIAGNOSIS — M9903 Segmental and somatic dysfunction of lumbar region: Secondary | ICD-10-CM | POA: Diagnosis not present

## 2022-06-07 DIAGNOSIS — F411 Generalized anxiety disorder: Secondary | ICD-10-CM | POA: Diagnosis not present

## 2022-06-14 DIAGNOSIS — M9903 Segmental and somatic dysfunction of lumbar region: Secondary | ICD-10-CM | POA: Diagnosis not present

## 2022-06-14 DIAGNOSIS — M9904 Segmental and somatic dysfunction of sacral region: Secondary | ICD-10-CM | POA: Diagnosis not present

## 2022-06-14 DIAGNOSIS — M9901 Segmental and somatic dysfunction of cervical region: Secondary | ICD-10-CM | POA: Diagnosis not present

## 2022-06-14 DIAGNOSIS — F411 Generalized anxiety disorder: Secondary | ICD-10-CM | POA: Diagnosis not present

## 2022-06-14 DIAGNOSIS — M9902 Segmental and somatic dysfunction of thoracic region: Secondary | ICD-10-CM | POA: Diagnosis not present

## 2022-06-21 DIAGNOSIS — F411 Generalized anxiety disorder: Secondary | ICD-10-CM | POA: Diagnosis not present

## 2022-07-07 DIAGNOSIS — H2513 Age-related nuclear cataract, bilateral: Secondary | ICD-10-CM | POA: Diagnosis not present

## 2022-07-12 DIAGNOSIS — F411 Generalized anxiety disorder: Secondary | ICD-10-CM | POA: Diagnosis not present

## 2022-07-19 DIAGNOSIS — M9905 Segmental and somatic dysfunction of pelvic region: Secondary | ICD-10-CM | POA: Diagnosis not present

## 2022-07-19 DIAGNOSIS — M9904 Segmental and somatic dysfunction of sacral region: Secondary | ICD-10-CM | POA: Diagnosis not present

## 2022-07-19 DIAGNOSIS — M9903 Segmental and somatic dysfunction of lumbar region: Secondary | ICD-10-CM | POA: Diagnosis not present

## 2022-07-19 DIAGNOSIS — M9902 Segmental and somatic dysfunction of thoracic region: Secondary | ICD-10-CM | POA: Diagnosis not present

## 2022-08-16 DIAGNOSIS — F411 Generalized anxiety disorder: Secondary | ICD-10-CM | POA: Diagnosis not present

## 2022-08-23 DIAGNOSIS — M9904 Segmental and somatic dysfunction of sacral region: Secondary | ICD-10-CM | POA: Diagnosis not present

## 2022-08-23 DIAGNOSIS — M9902 Segmental and somatic dysfunction of thoracic region: Secondary | ICD-10-CM | POA: Diagnosis not present

## 2022-08-23 DIAGNOSIS — M9905 Segmental and somatic dysfunction of pelvic region: Secondary | ICD-10-CM | POA: Diagnosis not present

## 2022-08-23 DIAGNOSIS — M9903 Segmental and somatic dysfunction of lumbar region: Secondary | ICD-10-CM | POA: Diagnosis not present

## 2022-09-07 DIAGNOSIS — F5089 Other specified eating disorder: Secondary | ICD-10-CM | POA: Diagnosis not present

## 2022-09-07 DIAGNOSIS — F411 Generalized anxiety disorder: Secondary | ICD-10-CM | POA: Diagnosis not present

## 2022-09-07 DIAGNOSIS — F330 Major depressive disorder, recurrent, mild: Secondary | ICD-10-CM | POA: Diagnosis not present

## 2022-09-11 DIAGNOSIS — F411 Generalized anxiety disorder: Secondary | ICD-10-CM | POA: Diagnosis not present

## 2022-09-11 DIAGNOSIS — F330 Major depressive disorder, recurrent, mild: Secondary | ICD-10-CM | POA: Diagnosis not present

## 2022-09-11 DIAGNOSIS — F5089 Other specified eating disorder: Secondary | ICD-10-CM | POA: Diagnosis not present

## 2022-09-12 DIAGNOSIS — F5089 Other specified eating disorder: Secondary | ICD-10-CM | POA: Diagnosis not present

## 2022-09-14 ENCOUNTER — Encounter (HOSPITAL_COMMUNITY): Payer: Self-pay | Admitting: *Deleted

## 2022-09-14 DIAGNOSIS — M9902 Segmental and somatic dysfunction of thoracic region: Secondary | ICD-10-CM | POA: Diagnosis not present

## 2022-09-14 DIAGNOSIS — M9905 Segmental and somatic dysfunction of pelvic region: Secondary | ICD-10-CM | POA: Diagnosis not present

## 2022-09-14 DIAGNOSIS — M9903 Segmental and somatic dysfunction of lumbar region: Secondary | ICD-10-CM | POA: Diagnosis not present

## 2022-09-14 DIAGNOSIS — M9904 Segmental and somatic dysfunction of sacral region: Secondary | ICD-10-CM | POA: Diagnosis not present

## 2022-09-19 DIAGNOSIS — F5089 Other specified eating disorder: Secondary | ICD-10-CM | POA: Diagnosis not present

## 2022-09-19 DIAGNOSIS — F330 Major depressive disorder, recurrent, mild: Secondary | ICD-10-CM | POA: Diagnosis not present

## 2022-09-19 DIAGNOSIS — F411 Generalized anxiety disorder: Secondary | ICD-10-CM | POA: Diagnosis not present

## 2022-09-20 DIAGNOSIS — F5089 Other specified eating disorder: Secondary | ICD-10-CM | POA: Diagnosis not present

## 2022-09-20 DIAGNOSIS — F330 Major depressive disorder, recurrent, mild: Secondary | ICD-10-CM | POA: Diagnosis not present

## 2022-09-20 DIAGNOSIS — F411 Generalized anxiety disorder: Secondary | ICD-10-CM | POA: Diagnosis not present

## 2022-09-21 DIAGNOSIS — F900 Attention-deficit hyperactivity disorder, predominantly inattentive type: Secondary | ICD-10-CM | POA: Diagnosis not present

## 2022-09-21 DIAGNOSIS — F411 Generalized anxiety disorder: Secondary | ICD-10-CM | POA: Diagnosis not present

## 2022-09-21 DIAGNOSIS — F3189 Other bipolar disorder: Secondary | ICD-10-CM | POA: Diagnosis not present

## 2022-09-26 DIAGNOSIS — F411 Generalized anxiety disorder: Secondary | ICD-10-CM | POA: Diagnosis not present

## 2022-09-26 DIAGNOSIS — F5089 Other specified eating disorder: Secondary | ICD-10-CM | POA: Diagnosis not present

## 2022-09-26 DIAGNOSIS — F330 Major depressive disorder, recurrent, mild: Secondary | ICD-10-CM | POA: Diagnosis not present

## 2022-09-28 DIAGNOSIS — F411 Generalized anxiety disorder: Secondary | ICD-10-CM | POA: Diagnosis not present

## 2022-09-28 DIAGNOSIS — F5089 Other specified eating disorder: Secondary | ICD-10-CM | POA: Diagnosis not present

## 2022-09-28 DIAGNOSIS — F330 Major depressive disorder, recurrent, mild: Secondary | ICD-10-CM | POA: Diagnosis not present

## 2022-10-02 DIAGNOSIS — F330 Major depressive disorder, recurrent, mild: Secondary | ICD-10-CM | POA: Diagnosis not present

## 2022-10-02 DIAGNOSIS — F411 Generalized anxiety disorder: Secondary | ICD-10-CM | POA: Diagnosis not present

## 2022-10-02 DIAGNOSIS — F5089 Other specified eating disorder: Secondary | ICD-10-CM | POA: Diagnosis not present

## 2022-10-03 DIAGNOSIS — M9903 Segmental and somatic dysfunction of lumbar region: Secondary | ICD-10-CM | POA: Diagnosis not present

## 2022-10-03 DIAGNOSIS — M9904 Segmental and somatic dysfunction of sacral region: Secondary | ICD-10-CM | POA: Diagnosis not present

## 2022-10-03 DIAGNOSIS — M9901 Segmental and somatic dysfunction of cervical region: Secondary | ICD-10-CM | POA: Diagnosis not present

## 2022-10-03 DIAGNOSIS — M9902 Segmental and somatic dysfunction of thoracic region: Secondary | ICD-10-CM | POA: Diagnosis not present

## 2022-10-09 DIAGNOSIS — D2271 Melanocytic nevi of right lower limb, including hip: Secondary | ICD-10-CM | POA: Diagnosis not present

## 2022-10-09 DIAGNOSIS — M9901 Segmental and somatic dysfunction of cervical region: Secondary | ICD-10-CM | POA: Diagnosis not present

## 2022-10-09 DIAGNOSIS — M9902 Segmental and somatic dysfunction of thoracic region: Secondary | ICD-10-CM | POA: Diagnosis not present

## 2022-10-09 DIAGNOSIS — D225 Melanocytic nevi of trunk: Secondary | ICD-10-CM | POA: Diagnosis not present

## 2022-10-09 DIAGNOSIS — D2261 Melanocytic nevi of right upper limb, including shoulder: Secondary | ICD-10-CM | POA: Diagnosis not present

## 2022-10-09 DIAGNOSIS — M9903 Segmental and somatic dysfunction of lumbar region: Secondary | ICD-10-CM | POA: Diagnosis not present

## 2022-10-09 DIAGNOSIS — L718 Other rosacea: Secondary | ICD-10-CM | POA: Diagnosis not present

## 2022-10-09 DIAGNOSIS — M9904 Segmental and somatic dysfunction of sacral region: Secondary | ICD-10-CM | POA: Diagnosis not present

## 2022-10-16 DIAGNOSIS — F411 Generalized anxiety disorder: Secondary | ICD-10-CM | POA: Diagnosis not present

## 2022-10-16 DIAGNOSIS — F330 Major depressive disorder, recurrent, mild: Secondary | ICD-10-CM | POA: Diagnosis not present

## 2022-10-16 DIAGNOSIS — F5089 Other specified eating disorder: Secondary | ICD-10-CM | POA: Diagnosis not present

## 2022-10-17 DIAGNOSIS — F330 Major depressive disorder, recurrent, mild: Secondary | ICD-10-CM | POA: Diagnosis not present

## 2022-10-17 DIAGNOSIS — F411 Generalized anxiety disorder: Secondary | ICD-10-CM | POA: Diagnosis not present

## 2022-10-17 DIAGNOSIS — F5089 Other specified eating disorder: Secondary | ICD-10-CM | POA: Diagnosis not present

## 2022-10-19 DIAGNOSIS — M9903 Segmental and somatic dysfunction of lumbar region: Secondary | ICD-10-CM | POA: Diagnosis not present

## 2022-10-19 DIAGNOSIS — M9901 Segmental and somatic dysfunction of cervical region: Secondary | ICD-10-CM | POA: Diagnosis not present

## 2022-10-19 DIAGNOSIS — M9904 Segmental and somatic dysfunction of sacral region: Secondary | ICD-10-CM | POA: Diagnosis not present

## 2022-10-19 DIAGNOSIS — M9902 Segmental and somatic dysfunction of thoracic region: Secondary | ICD-10-CM | POA: Diagnosis not present

## 2022-10-24 DIAGNOSIS — F330 Major depressive disorder, recurrent, mild: Secondary | ICD-10-CM | POA: Diagnosis not present

## 2022-10-24 DIAGNOSIS — F5089 Other specified eating disorder: Secondary | ICD-10-CM | POA: Diagnosis not present

## 2022-10-24 DIAGNOSIS — F411 Generalized anxiety disorder: Secondary | ICD-10-CM | POA: Diagnosis not present

## 2022-11-01 DIAGNOSIS — F411 Generalized anxiety disorder: Secondary | ICD-10-CM | POA: Diagnosis not present

## 2022-11-01 DIAGNOSIS — F5089 Other specified eating disorder: Secondary | ICD-10-CM | POA: Diagnosis not present

## 2022-11-01 DIAGNOSIS — F330 Major depressive disorder, recurrent, mild: Secondary | ICD-10-CM | POA: Diagnosis not present

## 2022-11-07 DIAGNOSIS — F330 Major depressive disorder, recurrent, mild: Secondary | ICD-10-CM | POA: Diagnosis not present

## 2022-11-07 DIAGNOSIS — F411 Generalized anxiety disorder: Secondary | ICD-10-CM | POA: Diagnosis not present

## 2022-11-07 DIAGNOSIS — F5089 Other specified eating disorder: Secondary | ICD-10-CM | POA: Diagnosis not present

## 2022-11-16 DIAGNOSIS — F330 Major depressive disorder, recurrent, mild: Secondary | ICD-10-CM | POA: Diagnosis not present

## 2022-11-16 DIAGNOSIS — F411 Generalized anxiety disorder: Secondary | ICD-10-CM | POA: Diagnosis not present

## 2022-11-16 DIAGNOSIS — F5089 Other specified eating disorder: Secondary | ICD-10-CM | POA: Diagnosis not present

## 2022-11-21 DIAGNOSIS — H0510 Unspecified chronic inflammatory disorders of orbit: Secondary | ICD-10-CM | POA: Diagnosis not present

## 2022-11-21 DIAGNOSIS — F411 Generalized anxiety disorder: Secondary | ICD-10-CM | POA: Diagnosis not present

## 2022-11-21 DIAGNOSIS — Z79899 Other long term (current) drug therapy: Secondary | ICD-10-CM | POA: Diagnosis not present

## 2022-11-21 DIAGNOSIS — F330 Major depressive disorder, recurrent, mild: Secondary | ICD-10-CM | POA: Diagnosis not present

## 2022-11-21 DIAGNOSIS — F5089 Other specified eating disorder: Secondary | ICD-10-CM | POA: Diagnosis not present

## 2022-11-22 DIAGNOSIS — M9903 Segmental and somatic dysfunction of lumbar region: Secondary | ICD-10-CM | POA: Diagnosis not present

## 2022-11-22 DIAGNOSIS — M9904 Segmental and somatic dysfunction of sacral region: Secondary | ICD-10-CM | POA: Diagnosis not present

## 2022-11-22 DIAGNOSIS — M9902 Segmental and somatic dysfunction of thoracic region: Secondary | ICD-10-CM | POA: Diagnosis not present

## 2022-11-22 DIAGNOSIS — M9901 Segmental and somatic dysfunction of cervical region: Secondary | ICD-10-CM | POA: Diagnosis not present

## 2022-11-27 DIAGNOSIS — M9904 Segmental and somatic dysfunction of sacral region: Secondary | ICD-10-CM | POA: Diagnosis not present

## 2022-11-27 DIAGNOSIS — M9903 Segmental and somatic dysfunction of lumbar region: Secondary | ICD-10-CM | POA: Diagnosis not present

## 2022-11-27 DIAGNOSIS — M9901 Segmental and somatic dysfunction of cervical region: Secondary | ICD-10-CM | POA: Diagnosis not present

## 2022-11-27 DIAGNOSIS — M9902 Segmental and somatic dysfunction of thoracic region: Secondary | ICD-10-CM | POA: Diagnosis not present

## 2022-11-29 DIAGNOSIS — M9901 Segmental and somatic dysfunction of cervical region: Secondary | ICD-10-CM | POA: Diagnosis not present

## 2022-11-29 DIAGNOSIS — M9903 Segmental and somatic dysfunction of lumbar region: Secondary | ICD-10-CM | POA: Diagnosis not present

## 2022-11-29 DIAGNOSIS — F5089 Other specified eating disorder: Secondary | ICD-10-CM | POA: Diagnosis not present

## 2022-11-29 DIAGNOSIS — M9902 Segmental and somatic dysfunction of thoracic region: Secondary | ICD-10-CM | POA: Diagnosis not present

## 2022-11-29 DIAGNOSIS — F411 Generalized anxiety disorder: Secondary | ICD-10-CM | POA: Diagnosis not present

## 2022-11-29 DIAGNOSIS — F330 Major depressive disorder, recurrent, mild: Secondary | ICD-10-CM | POA: Diagnosis not present

## 2022-11-29 DIAGNOSIS — M9904 Segmental and somatic dysfunction of sacral region: Secondary | ICD-10-CM | POA: Diagnosis not present

## 2022-12-05 DIAGNOSIS — F5089 Other specified eating disorder: Secondary | ICD-10-CM | POA: Diagnosis not present

## 2022-12-05 DIAGNOSIS — F411 Generalized anxiety disorder: Secondary | ICD-10-CM | POA: Diagnosis not present

## 2022-12-05 DIAGNOSIS — F330 Major depressive disorder, recurrent, mild: Secondary | ICD-10-CM | POA: Diagnosis not present

## 2022-12-07 DIAGNOSIS — M9904 Segmental and somatic dysfunction of sacral region: Secondary | ICD-10-CM | POA: Diagnosis not present

## 2022-12-07 DIAGNOSIS — M9902 Segmental and somatic dysfunction of thoracic region: Secondary | ICD-10-CM | POA: Diagnosis not present

## 2022-12-07 DIAGNOSIS — M9901 Segmental and somatic dysfunction of cervical region: Secondary | ICD-10-CM | POA: Diagnosis not present

## 2022-12-07 DIAGNOSIS — M9903 Segmental and somatic dysfunction of lumbar region: Secondary | ICD-10-CM | POA: Diagnosis not present

## 2022-12-11 DIAGNOSIS — M9903 Segmental and somatic dysfunction of lumbar region: Secondary | ICD-10-CM | POA: Diagnosis not present

## 2022-12-11 DIAGNOSIS — M9902 Segmental and somatic dysfunction of thoracic region: Secondary | ICD-10-CM | POA: Diagnosis not present

## 2022-12-11 DIAGNOSIS — M9905 Segmental and somatic dysfunction of pelvic region: Secondary | ICD-10-CM | POA: Diagnosis not present

## 2022-12-11 DIAGNOSIS — M9904 Segmental and somatic dysfunction of sacral region: Secondary | ICD-10-CM | POA: Diagnosis not present

## 2022-12-12 DIAGNOSIS — M9903 Segmental and somatic dysfunction of lumbar region: Secondary | ICD-10-CM | POA: Diagnosis not present

## 2022-12-12 DIAGNOSIS — M9904 Segmental and somatic dysfunction of sacral region: Secondary | ICD-10-CM | POA: Diagnosis not present

## 2022-12-12 DIAGNOSIS — F411 Generalized anxiety disorder: Secondary | ICD-10-CM | POA: Diagnosis not present

## 2022-12-12 DIAGNOSIS — M9902 Segmental and somatic dysfunction of thoracic region: Secondary | ICD-10-CM | POA: Diagnosis not present

## 2022-12-12 DIAGNOSIS — M9905 Segmental and somatic dysfunction of pelvic region: Secondary | ICD-10-CM | POA: Diagnosis not present

## 2022-12-12 DIAGNOSIS — F330 Major depressive disorder, recurrent, mild: Secondary | ICD-10-CM | POA: Diagnosis not present

## 2022-12-12 DIAGNOSIS — F5089 Other specified eating disorder: Secondary | ICD-10-CM | POA: Diagnosis not present

## 2022-12-14 DIAGNOSIS — M9905 Segmental and somatic dysfunction of pelvic region: Secondary | ICD-10-CM | POA: Diagnosis not present

## 2022-12-14 DIAGNOSIS — M9902 Segmental and somatic dysfunction of thoracic region: Secondary | ICD-10-CM | POA: Diagnosis not present

## 2022-12-14 DIAGNOSIS — M9904 Segmental and somatic dysfunction of sacral region: Secondary | ICD-10-CM | POA: Diagnosis not present

## 2022-12-14 DIAGNOSIS — M9903 Segmental and somatic dysfunction of lumbar region: Secondary | ICD-10-CM | POA: Diagnosis not present

## 2022-12-18 DIAGNOSIS — M9905 Segmental and somatic dysfunction of pelvic region: Secondary | ICD-10-CM | POA: Diagnosis not present

## 2022-12-18 DIAGNOSIS — M9902 Segmental and somatic dysfunction of thoracic region: Secondary | ICD-10-CM | POA: Diagnosis not present

## 2022-12-18 DIAGNOSIS — M9903 Segmental and somatic dysfunction of lumbar region: Secondary | ICD-10-CM | POA: Diagnosis not present

## 2022-12-18 DIAGNOSIS — M9904 Segmental and somatic dysfunction of sacral region: Secondary | ICD-10-CM | POA: Diagnosis not present

## 2022-12-20 DIAGNOSIS — M9903 Segmental and somatic dysfunction of lumbar region: Secondary | ICD-10-CM | POA: Diagnosis not present

## 2022-12-20 DIAGNOSIS — M9902 Segmental and somatic dysfunction of thoracic region: Secondary | ICD-10-CM | POA: Diagnosis not present

## 2022-12-20 DIAGNOSIS — M9905 Segmental and somatic dysfunction of pelvic region: Secondary | ICD-10-CM | POA: Diagnosis not present

## 2022-12-20 DIAGNOSIS — M9904 Segmental and somatic dysfunction of sacral region: Secondary | ICD-10-CM | POA: Diagnosis not present

## 2022-12-27 DIAGNOSIS — F330 Major depressive disorder, recurrent, mild: Secondary | ICD-10-CM | POA: Diagnosis not present

## 2022-12-27 DIAGNOSIS — F411 Generalized anxiety disorder: Secondary | ICD-10-CM | POA: Diagnosis not present

## 2022-12-27 DIAGNOSIS — F5089 Other specified eating disorder: Secondary | ICD-10-CM | POA: Diagnosis not present

## 2022-12-29 ENCOUNTER — Other Ambulatory Visit: Payer: Self-pay | Admitting: Family Medicine

## 2022-12-29 DIAGNOSIS — M546 Pain in thoracic spine: Secondary | ICD-10-CM

## 2023-01-01 ENCOUNTER — Other Ambulatory Visit: Payer: Self-pay | Admitting: Family Medicine

## 2023-01-01 DIAGNOSIS — R109 Unspecified abdominal pain: Secondary | ICD-10-CM

## 2023-01-02 DIAGNOSIS — F5089 Other specified eating disorder: Secondary | ICD-10-CM | POA: Diagnosis not present

## 2023-01-02 DIAGNOSIS — F411 Generalized anxiety disorder: Secondary | ICD-10-CM | POA: Diagnosis not present

## 2023-01-02 DIAGNOSIS — F330 Major depressive disorder, recurrent, mild: Secondary | ICD-10-CM | POA: Diagnosis not present

## 2023-01-11 DIAGNOSIS — F411 Generalized anxiety disorder: Secondary | ICD-10-CM | POA: Diagnosis not present

## 2023-01-11 DIAGNOSIS — F5089 Other specified eating disorder: Secondary | ICD-10-CM | POA: Diagnosis not present

## 2023-01-11 DIAGNOSIS — F330 Major depressive disorder, recurrent, mild: Secondary | ICD-10-CM | POA: Diagnosis not present

## 2023-01-16 DIAGNOSIS — F330 Major depressive disorder, recurrent, mild: Secondary | ICD-10-CM | POA: Diagnosis not present

## 2023-01-16 DIAGNOSIS — F5089 Other specified eating disorder: Secondary | ICD-10-CM | POA: Diagnosis not present

## 2023-01-16 DIAGNOSIS — F411 Generalized anxiety disorder: Secondary | ICD-10-CM | POA: Diagnosis not present

## 2023-01-21 ENCOUNTER — Other Ambulatory Visit: Payer: BC Managed Care – PPO

## 2023-01-22 DIAGNOSIS — F5089 Other specified eating disorder: Secondary | ICD-10-CM | POA: Diagnosis not present

## 2023-01-22 DIAGNOSIS — F411 Generalized anxiety disorder: Secondary | ICD-10-CM | POA: Diagnosis not present

## 2023-01-22 DIAGNOSIS — F330 Major depressive disorder, recurrent, mild: Secondary | ICD-10-CM | POA: Diagnosis not present

## 2023-01-31 DIAGNOSIS — F5089 Other specified eating disorder: Secondary | ICD-10-CM | POA: Diagnosis not present

## 2023-01-31 DIAGNOSIS — F330 Major depressive disorder, recurrent, mild: Secondary | ICD-10-CM | POA: Diagnosis not present

## 2023-01-31 DIAGNOSIS — F411 Generalized anxiety disorder: Secondary | ICD-10-CM | POA: Diagnosis not present

## 2023-02-05 DIAGNOSIS — F330 Major depressive disorder, recurrent, mild: Secondary | ICD-10-CM | POA: Diagnosis not present

## 2023-02-05 DIAGNOSIS — F411 Generalized anxiety disorder: Secondary | ICD-10-CM | POA: Diagnosis not present

## 2023-02-05 DIAGNOSIS — F5089 Other specified eating disorder: Secondary | ICD-10-CM | POA: Diagnosis not present

## 2023-02-06 ENCOUNTER — Other Ambulatory Visit: Payer: Self-pay | Admitting: Family Medicine

## 2023-02-06 DIAGNOSIS — R1011 Right upper quadrant pain: Secondary | ICD-10-CM

## 2023-02-08 DIAGNOSIS — Z6841 Body Mass Index (BMI) 40.0 and over, adult: Secondary | ICD-10-CM | POA: Diagnosis not present

## 2023-02-08 DIAGNOSIS — Z124 Encounter for screening for malignant neoplasm of cervix: Secondary | ICD-10-CM | POA: Diagnosis not present

## 2023-02-08 DIAGNOSIS — N951 Menopausal and female climacteric states: Secondary | ICD-10-CM | POA: Diagnosis not present

## 2023-02-08 DIAGNOSIS — Z01419 Encounter for gynecological examination (general) (routine) without abnormal findings: Secondary | ICD-10-CM | POA: Diagnosis not present

## 2023-02-08 DIAGNOSIS — Z1231 Encounter for screening mammogram for malignant neoplasm of breast: Secondary | ICD-10-CM | POA: Diagnosis not present

## 2023-02-12 DIAGNOSIS — F411 Generalized anxiety disorder: Secondary | ICD-10-CM | POA: Diagnosis not present

## 2023-02-12 DIAGNOSIS — F5089 Other specified eating disorder: Secondary | ICD-10-CM | POA: Diagnosis not present

## 2023-02-12 DIAGNOSIS — F330 Major depressive disorder, recurrent, mild: Secondary | ICD-10-CM | POA: Diagnosis not present

## 2023-02-16 ENCOUNTER — Ambulatory Visit
Admission: RE | Admit: 2023-02-16 | Discharge: 2023-02-16 | Disposition: A | Discharge status: Home / Self Care | Payer: BC Managed Care – PPO | Source: Ambulatory Visit | Attending: Family Medicine | Admitting: Family Medicine

## 2023-02-16 DIAGNOSIS — R1011 Right upper quadrant pain: Secondary | ICD-10-CM

## 2023-02-16 DIAGNOSIS — K8020 Calculus of gallbladder without cholecystitis without obstruction: Secondary | ICD-10-CM | POA: Diagnosis not present

## 2023-02-16 DIAGNOSIS — K760 Fatty (change of) liver, not elsewhere classified: Secondary | ICD-10-CM | POA: Diagnosis not present

## 2023-02-19 DIAGNOSIS — F411 Generalized anxiety disorder: Secondary | ICD-10-CM | POA: Diagnosis not present

## 2023-02-19 DIAGNOSIS — F330 Major depressive disorder, recurrent, mild: Secondary | ICD-10-CM | POA: Diagnosis not present

## 2023-02-19 DIAGNOSIS — F5089 Other specified eating disorder: Secondary | ICD-10-CM | POA: Diagnosis not present

## 2023-02-21 DIAGNOSIS — F411 Generalized anxiety disorder: Secondary | ICD-10-CM | POA: Diagnosis not present

## 2023-02-21 DIAGNOSIS — F330 Major depressive disorder, recurrent, mild: Secondary | ICD-10-CM | POA: Diagnosis not present

## 2023-02-21 DIAGNOSIS — F5089 Other specified eating disorder: Secondary | ICD-10-CM | POA: Diagnosis not present

## 2023-02-26 DIAGNOSIS — R1031 Right lower quadrant pain: Secondary | ICD-10-CM | POA: Diagnosis not present

## 2023-03-05 DIAGNOSIS — F509 Eating disorder, unspecified: Secondary | ICD-10-CM | POA: Diagnosis not present

## 2023-03-05 DIAGNOSIS — F411 Generalized anxiety disorder: Secondary | ICD-10-CM | POA: Diagnosis not present

## 2023-03-05 DIAGNOSIS — F330 Major depressive disorder, recurrent, mild: Secondary | ICD-10-CM | POA: Diagnosis not present

## 2023-03-07 DIAGNOSIS — F411 Generalized anxiety disorder: Secondary | ICD-10-CM | POA: Diagnosis not present

## 2023-03-07 DIAGNOSIS — F330 Major depressive disorder, recurrent, mild: Secondary | ICD-10-CM | POA: Diagnosis not present

## 2023-03-07 DIAGNOSIS — M9904 Segmental and somatic dysfunction of sacral region: Secondary | ICD-10-CM | POA: Diagnosis not present

## 2023-03-07 DIAGNOSIS — M9905 Segmental and somatic dysfunction of pelvic region: Secondary | ICD-10-CM | POA: Diagnosis not present

## 2023-03-07 DIAGNOSIS — M9903 Segmental and somatic dysfunction of lumbar region: Secondary | ICD-10-CM | POA: Diagnosis not present

## 2023-03-07 DIAGNOSIS — M9902 Segmental and somatic dysfunction of thoracic region: Secondary | ICD-10-CM | POA: Diagnosis not present

## 2023-03-07 DIAGNOSIS — F509 Eating disorder, unspecified: Secondary | ICD-10-CM | POA: Diagnosis not present

## 2023-03-15 DIAGNOSIS — F330 Major depressive disorder, recurrent, mild: Secondary | ICD-10-CM | POA: Diagnosis not present

## 2023-03-15 DIAGNOSIS — F411 Generalized anxiety disorder: Secondary | ICD-10-CM | POA: Diagnosis not present

## 2023-03-15 DIAGNOSIS — F509 Eating disorder, unspecified: Secondary | ICD-10-CM | POA: Diagnosis not present

## 2023-03-16 DIAGNOSIS — F3189 Other bipolar disorder: Secondary | ICD-10-CM | POA: Diagnosis not present

## 2023-03-16 DIAGNOSIS — F900 Attention-deficit hyperactivity disorder, predominantly inattentive type: Secondary | ICD-10-CM | POA: Diagnosis not present

## 2023-03-16 DIAGNOSIS — F411 Generalized anxiety disorder: Secondary | ICD-10-CM | POA: Diagnosis not present

## 2023-03-19 DIAGNOSIS — K8020 Calculus of gallbladder without cholecystitis without obstruction: Secondary | ICD-10-CM | POA: Diagnosis not present

## 2023-03-26 DIAGNOSIS — F411 Generalized anxiety disorder: Secondary | ICD-10-CM | POA: Diagnosis not present

## 2023-03-26 DIAGNOSIS — F330 Major depressive disorder, recurrent, mild: Secondary | ICD-10-CM | POA: Diagnosis not present

## 2023-03-26 DIAGNOSIS — F509 Eating disorder, unspecified: Secondary | ICD-10-CM | POA: Diagnosis not present

## 2023-03-28 DIAGNOSIS — F330 Major depressive disorder, recurrent, mild: Secondary | ICD-10-CM | POA: Diagnosis not present

## 2023-03-28 DIAGNOSIS — F411 Generalized anxiety disorder: Secondary | ICD-10-CM | POA: Diagnosis not present

## 2023-03-28 DIAGNOSIS — F509 Eating disorder, unspecified: Secondary | ICD-10-CM | POA: Diagnosis not present

## 2023-04-02 DIAGNOSIS — F411 Generalized anxiety disorder: Secondary | ICD-10-CM | POA: Diagnosis not present

## 2023-04-02 DIAGNOSIS — F330 Major depressive disorder, recurrent, mild: Secondary | ICD-10-CM | POA: Diagnosis not present

## 2023-04-02 DIAGNOSIS — F509 Eating disorder, unspecified: Secondary | ICD-10-CM | POA: Diagnosis not present

## 2023-04-09 DIAGNOSIS — F509 Eating disorder, unspecified: Secondary | ICD-10-CM | POA: Diagnosis not present

## 2023-04-09 DIAGNOSIS — F411 Generalized anxiety disorder: Secondary | ICD-10-CM | POA: Diagnosis not present

## 2023-04-09 DIAGNOSIS — F330 Major depressive disorder, recurrent, mild: Secondary | ICD-10-CM | POA: Diagnosis not present

## 2023-04-10 DIAGNOSIS — M9902 Segmental and somatic dysfunction of thoracic region: Secondary | ICD-10-CM | POA: Diagnosis not present

## 2023-04-10 DIAGNOSIS — M9905 Segmental and somatic dysfunction of pelvic region: Secondary | ICD-10-CM | POA: Diagnosis not present

## 2023-04-10 DIAGNOSIS — M9903 Segmental and somatic dysfunction of lumbar region: Secondary | ICD-10-CM | POA: Diagnosis not present

## 2023-04-10 DIAGNOSIS — M9904 Segmental and somatic dysfunction of sacral region: Secondary | ICD-10-CM | POA: Diagnosis not present

## 2023-04-11 DIAGNOSIS — F411 Generalized anxiety disorder: Secondary | ICD-10-CM | POA: Diagnosis not present

## 2023-04-11 DIAGNOSIS — F509 Eating disorder, unspecified: Secondary | ICD-10-CM | POA: Diagnosis not present

## 2023-04-11 DIAGNOSIS — F330 Major depressive disorder, recurrent, mild: Secondary | ICD-10-CM | POA: Diagnosis not present

## 2023-04-16 DIAGNOSIS — F330 Major depressive disorder, recurrent, mild: Secondary | ICD-10-CM | POA: Diagnosis not present

## 2023-04-16 DIAGNOSIS — F411 Generalized anxiety disorder: Secondary | ICD-10-CM | POA: Diagnosis not present

## 2023-04-16 DIAGNOSIS — F509 Eating disorder, unspecified: Secondary | ICD-10-CM | POA: Diagnosis not present

## 2023-04-23 DIAGNOSIS — F330 Major depressive disorder, recurrent, mild: Secondary | ICD-10-CM | POA: Diagnosis not present

## 2023-04-23 DIAGNOSIS — F411 Generalized anxiety disorder: Secondary | ICD-10-CM | POA: Diagnosis not present

## 2023-04-23 DIAGNOSIS — F509 Eating disorder, unspecified: Secondary | ICD-10-CM | POA: Diagnosis not present

## 2023-04-30 DIAGNOSIS — F411 Generalized anxiety disorder: Secondary | ICD-10-CM | POA: Diagnosis not present

## 2023-04-30 DIAGNOSIS — F509 Eating disorder, unspecified: Secondary | ICD-10-CM | POA: Diagnosis not present

## 2023-04-30 DIAGNOSIS — F330 Major depressive disorder, recurrent, mild: Secondary | ICD-10-CM | POA: Diagnosis not present

## 2023-05-02 DIAGNOSIS — M9903 Segmental and somatic dysfunction of lumbar region: Secondary | ICD-10-CM | POA: Diagnosis not present

## 2023-05-02 DIAGNOSIS — M9902 Segmental and somatic dysfunction of thoracic region: Secondary | ICD-10-CM | POA: Diagnosis not present

## 2023-05-02 DIAGNOSIS — M9904 Segmental and somatic dysfunction of sacral region: Secondary | ICD-10-CM | POA: Diagnosis not present

## 2023-05-02 DIAGNOSIS — M9905 Segmental and somatic dysfunction of pelvic region: Secondary | ICD-10-CM | POA: Diagnosis not present

## 2023-05-09 DIAGNOSIS — F411 Generalized anxiety disorder: Secondary | ICD-10-CM | POA: Diagnosis not present

## 2023-05-09 DIAGNOSIS — F509 Eating disorder, unspecified: Secondary | ICD-10-CM | POA: Diagnosis not present

## 2023-05-09 DIAGNOSIS — F330 Major depressive disorder, recurrent, mild: Secondary | ICD-10-CM | POA: Diagnosis not present

## 2023-05-21 DIAGNOSIS — F330 Major depressive disorder, recurrent, mild: Secondary | ICD-10-CM | POA: Diagnosis not present

## 2023-05-21 DIAGNOSIS — F509 Eating disorder, unspecified: Secondary | ICD-10-CM | POA: Diagnosis not present

## 2023-05-21 DIAGNOSIS — F411 Generalized anxiety disorder: Secondary | ICD-10-CM | POA: Diagnosis not present

## 2023-05-22 DIAGNOSIS — Z0000 Encounter for general adult medical examination without abnormal findings: Secondary | ICD-10-CM | POA: Diagnosis not present

## 2023-05-22 DIAGNOSIS — R7303 Prediabetes: Secondary | ICD-10-CM | POA: Diagnosis not present

## 2023-05-22 DIAGNOSIS — Z1159 Encounter for screening for other viral diseases: Secondary | ICD-10-CM | POA: Diagnosis not present

## 2023-05-22 DIAGNOSIS — Z13220 Encounter for screening for lipoid disorders: Secondary | ICD-10-CM | POA: Diagnosis not present

## 2023-05-22 DIAGNOSIS — E0590 Thyrotoxicosis, unspecified without thyrotoxic crisis or storm: Secondary | ICD-10-CM | POA: Diagnosis not present

## 2023-05-22 DIAGNOSIS — Z9884 Bariatric surgery status: Secondary | ICD-10-CM | POA: Diagnosis not present

## 2023-05-22 DIAGNOSIS — Z23 Encounter for immunization: Secondary | ICD-10-CM | POA: Diagnosis not present

## 2023-05-22 DIAGNOSIS — J302 Other seasonal allergic rhinitis: Secondary | ICD-10-CM | POA: Diagnosis not present

## 2023-05-31 DIAGNOSIS — F411 Generalized anxiety disorder: Secondary | ICD-10-CM | POA: Diagnosis not present

## 2023-05-31 DIAGNOSIS — F330 Major depressive disorder, recurrent, mild: Secondary | ICD-10-CM | POA: Diagnosis not present

## 2023-05-31 DIAGNOSIS — F509 Eating disorder, unspecified: Secondary | ICD-10-CM | POA: Diagnosis not present

## 2023-06-04 DIAGNOSIS — F330 Major depressive disorder, recurrent, mild: Secondary | ICD-10-CM | POA: Diagnosis not present

## 2023-06-04 DIAGNOSIS — F411 Generalized anxiety disorder: Secondary | ICD-10-CM | POA: Diagnosis not present

## 2023-06-04 DIAGNOSIS — F509 Eating disorder, unspecified: Secondary | ICD-10-CM | POA: Diagnosis not present

## 2023-06-11 DIAGNOSIS — F509 Eating disorder, unspecified: Secondary | ICD-10-CM | POA: Diagnosis not present

## 2023-06-11 DIAGNOSIS — F330 Major depressive disorder, recurrent, mild: Secondary | ICD-10-CM | POA: Diagnosis not present

## 2023-06-11 DIAGNOSIS — F411 Generalized anxiety disorder: Secondary | ICD-10-CM | POA: Diagnosis not present

## 2023-06-25 DIAGNOSIS — F330 Major depressive disorder, recurrent, mild: Secondary | ICD-10-CM | POA: Diagnosis not present

## 2023-06-25 DIAGNOSIS — F411 Generalized anxiety disorder: Secondary | ICD-10-CM | POA: Diagnosis not present

## 2023-06-25 DIAGNOSIS — F509 Eating disorder, unspecified: Secondary | ICD-10-CM | POA: Diagnosis not present

## 2023-06-27 DIAGNOSIS — F509 Eating disorder, unspecified: Secondary | ICD-10-CM | POA: Diagnosis not present

## 2023-06-27 DIAGNOSIS — F330 Major depressive disorder, recurrent, mild: Secondary | ICD-10-CM | POA: Diagnosis not present

## 2023-06-27 DIAGNOSIS — F411 Generalized anxiety disorder: Secondary | ICD-10-CM | POA: Diagnosis not present

## 2023-07-02 DIAGNOSIS — F411 Generalized anxiety disorder: Secondary | ICD-10-CM | POA: Diagnosis not present

## 2023-07-02 DIAGNOSIS — F509 Eating disorder, unspecified: Secondary | ICD-10-CM | POA: Diagnosis not present

## 2023-07-02 DIAGNOSIS — F330 Major depressive disorder, recurrent, mild: Secondary | ICD-10-CM | POA: Diagnosis not present

## 2023-07-09 DIAGNOSIS — F411 Generalized anxiety disorder: Secondary | ICD-10-CM | POA: Diagnosis not present

## 2023-07-09 DIAGNOSIS — F330 Major depressive disorder, recurrent, mild: Secondary | ICD-10-CM | POA: Diagnosis not present

## 2023-07-09 DIAGNOSIS — F509 Eating disorder, unspecified: Secondary | ICD-10-CM | POA: Diagnosis not present

## 2023-07-13 DIAGNOSIS — H43812 Vitreous degeneration, left eye: Secondary | ICD-10-CM | POA: Diagnosis not present

## 2023-07-13 DIAGNOSIS — H05121 Orbital myositis, right orbit: Secondary | ICD-10-CM | POA: Diagnosis not present

## 2023-07-13 DIAGNOSIS — H40013 Open angle with borderline findings, low risk, bilateral: Secondary | ICD-10-CM | POA: Diagnosis not present

## 2023-07-13 DIAGNOSIS — H43813 Vitreous degeneration, bilateral: Secondary | ICD-10-CM | POA: Diagnosis not present

## 2023-07-13 DIAGNOSIS — H43811 Vitreous degeneration, right eye: Secondary | ICD-10-CM | POA: Diagnosis not present

## 2023-07-13 DIAGNOSIS — H2513 Age-related nuclear cataract, bilateral: Secondary | ICD-10-CM | POA: Diagnosis not present

## 2023-07-20 DIAGNOSIS — F411 Generalized anxiety disorder: Secondary | ICD-10-CM | POA: Diagnosis not present

## 2023-07-20 DIAGNOSIS — F900 Attention-deficit hyperactivity disorder, predominantly inattentive type: Secondary | ICD-10-CM | POA: Diagnosis not present

## 2023-07-20 DIAGNOSIS — F3189 Other bipolar disorder: Secondary | ICD-10-CM | POA: Diagnosis not present

## 2023-07-23 DIAGNOSIS — F411 Generalized anxiety disorder: Secondary | ICD-10-CM | POA: Diagnosis not present

## 2023-07-23 DIAGNOSIS — F330 Major depressive disorder, recurrent, mild: Secondary | ICD-10-CM | POA: Diagnosis not present

## 2023-07-23 DIAGNOSIS — F509 Eating disorder, unspecified: Secondary | ICD-10-CM | POA: Diagnosis not present

## 2023-07-30 DIAGNOSIS — F509 Eating disorder, unspecified: Secondary | ICD-10-CM | POA: Diagnosis not present

## 2023-07-30 DIAGNOSIS — F330 Major depressive disorder, recurrent, mild: Secondary | ICD-10-CM | POA: Diagnosis not present

## 2023-07-30 DIAGNOSIS — F411 Generalized anxiety disorder: Secondary | ICD-10-CM | POA: Diagnosis not present

## 2023-08-02 ENCOUNTER — Ambulatory Visit: Payer: BC Managed Care – PPO | Admitting: Allergy & Immunology

## 2023-08-02 ENCOUNTER — Encounter: Payer: Self-pay | Admitting: Allergy & Immunology

## 2023-08-02 ENCOUNTER — Other Ambulatory Visit: Payer: Self-pay

## 2023-08-02 VITALS — BP 122/90 | HR 54 | Temp 98.2°F | Ht 66.0 in | Wt 309.3 lb

## 2023-08-02 DIAGNOSIS — J310 Chronic rhinitis: Secondary | ICD-10-CM

## 2023-08-02 DIAGNOSIS — D849 Immunodeficiency, unspecified: Secondary | ICD-10-CM

## 2023-08-02 NOTE — Patient Instructions (Addendum)
 1. Chronic rhinitis - Because of insurance stipulations, we cannot do skin testing on the same day as your first visit. - We are all working to fight this, but for now we need to do two separate visits.  - We will know more after we do testing at the next visit.  - The skin testing visit can be squeezed in at your convenience.  - Then we can make a more full plan to address all of your symptoms. - Be sure to stop your antihistamines for 3 days before this appointment.   2. Adverse food reaction - We can test for the most common foods at the next visit. - This will rule out more than 95% of all food allergens. - Food testing sheet provided (there are high FALSE POSITIVE rates, so be careful with choosing).  3. Return in about 1 week (around 08/09/2023) for SKIN TESTING (1-55 + 1-17 + selected foods). You can have the follow up appointment with Dr. Dellis Anes or a Nurse Practicioner (our Nurse Practitioners are excellent and always have Physician oversight!).    Please inform us of any Emergency Department visits, hospitalizations, or changes in symptoms. Call us before going to the ED for breathing or allergy symptoms since we might be able to fit you in for a sick visit. Feel free to contact us anytime with any questions, problems, or concerns.  It was a pleasure to meet you today!  Websites that have reliable patient information: 1. American Academy of Asthma, Allergy, and Immunology: www.aaaai.org 2. Food Allergy Research and Education (FARE): foodallergy.org 3. Mothers of Asthmatics: http://www.asthmacommunitynetwork.org 4. American College of Allergy, Asthma, and Immunology: www.acaai.org      "Like" [place] on Facebook and Instagram for our latest updates!      A healthy democracy works best when [employer] participate! Make sure you are registered to vote! If you have moved or changed any of your contact information, you will need to get this updated before voting! Scan the QR codes  below to learn more!

## 2023-08-02 NOTE — Progress Notes (Signed)
 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 Tami Ribas)  GERD - on pantoprazole  Massage therapist   Plan/Recommendations:   1. Chronic rhinitis - Because of insurance stipulations, we cannot do skin testing on the same day as your first visit. - We are all working to fight this, but for now we need to do two separate visits.  - We will know more after we do testing at the next visit.  - The skin testing visit can be squeezed in at your convenience.  - Then we can make a more full plan to address all of your symptoms. - Be sure to stop your antihistamines for 3 days before this appointment.   2. Adverse food reaction - We can test for the most common foods at the next visit. - This will rule out more than 95% of all food allergens. - Food testing sheet provided (there are high FALSE POSITIVE rates, so be careful with choosing).  3. Return in about 1 week (around 08/09/2023) for SKIN TESTING (1-55 + 1-17 + selected foods). You can have the follow up appointment with Dr. Dellis Anes or a Nurse Practicioner (our Nurse Practitioners are excellent and always have Physician oversight!).   This note in its entirety was forwarded to the Provider who requested this consultation.  Subjective:   Yvette White White is a 53 y.o. 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   Migraine without aura and without status migrainosus, not intractable 12/31/2017   Chronic thoracic back pain 09/27/2017   Low back pain 09/27/2017   Former smoker, quit in 2016 07/29/2017   Bladder spasms 07/29/2017   History of abnormal mammogram 07/29/2017   Thyroid condition, history of low TSH that normalized with monitoring 07/29/2017   ADD (attention deficit disorder)    OSA on CPAP    Vitamin D deficiency 05/02/2017   Pre-diabetes 05/02/2017   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  Discussed the use of AI scribe software for clinical note transcription with the patient and/or guardian, who gave verbal consent to proceed.  Yvette White White was referred by Aliene Beams, MD.     [name] is a 53 y.o. female presenting for an evaluation of chronic rhinitis .  Allergic Rhinitis  Symptom History: She has experienced sinus issues for a long time, including postnasal drip, stuffiness, and a runny nose. A history of a stuffy nose since childhood has progressed to more frequent sinus infections with age. Various treatments, including montelukast, Claritin, and Flonase, have been tried without significant relief. Discontinuation of these medications led to slight improvement, possibly a placebo effect. She recalls allergy testing as a child but does not remember the results. No asthma, eczema, or other  autoimmune conditions aside from idiopathic orbital inflammatory syndrome. No frequent infections, pneumonias, or ear infections. No rashes.  GERD Symptom History: She is on Protonix. This does seem to work well for her. This is managed by her primary care physician. She had a gastric sleeve in 2018. She has tolerated that well.   Infection Symptom History: She has intermittent UTIs.   She has idiopathic orbital inflammatory syndrome affecting her right eye, characterized by inflammation of two eye muscles, causing eye pain, headaches, and visible inflammation. Procedures have been performed to scrape away the inflammation. Currently, she is on methotrexate, taken orally once a week, which effectively manages her symptoms. Previously, she was on Humira and prednisone for about two years before starting methotrexate.  She was diagnosed with ADD in 2011 and is on an extended-release form of Adderall. She believes she had symptoms long before her diagnosis. She also experiences anxiety, which she feels complicates her condition.  She takes ibuprofen for back spasms and has been informed that it does not interfere with allergy testing. She mentions a sensitivity to cantaloupe, which causes her tongue to tingle, but she continues to eat it occasionally. She is self-employed as a Teacher, adult education and takes care of her 32 year old mother, who has Parkinson's disease. Her mother has been living with her for about seven years.     Otherwise, there is no history of other atopic diseases, including asthma, food allergies, drug allergies, stinging insect allergies, or contact dermatitis. There is no significant infectious history. Vaccinations are up to date.    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   Migraine  without aura and without status migrainosus, not intractable 12/31/2017   Chronic thoracic back pain 09/27/2017   Low back pain 09/27/2017   Former smoker, quit in 2016 07/29/2017   Bladder spasms 07/29/2017   History of abnormal mammogram 07/29/2017   Thyroid condition, history of low TSH that normalized with monitoring 07/29/2017   ADD (attention deficit disorder)    OSA on CPAP    Vitamin D deficiency 05/02/2017   Pre-diabetes 05/02/2017   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 02/20/2017   Bipolar 1 disorder (HCC), followed by Psych, controlled with Lamictal 01/24/2017   Morbid obesity (HCC), s/p gastric sleeve, at time of surgery - weight 382, BMI 61 07/13/2016   Abnormal menses 03/06/2016   GERD (gastroesophageal reflux disease), on Prilosec 03/06/2016   History of neurocardiogenic syncope, no episodes since 2017 03/06/2016   Pharyngoesophageal dysphagia 03/06/2016   Seasonal allergies, controlled with Claritin 08/25/2013    Medication List:  Allergies as of 08/02/2023       Reactions   Theophylline    Other reaction(s): Other (See Comments) Hyperactive "Slo phylinne"   Penicillins Rash   Has patient had a PCN reaction causing immediate rash, facial/tongue/throat swelling, SOB or lightheadedness with hypotension:unsure Has  patient had a PCN reaction causing severe rash involving mucus membranes or skin necrosis:unsure Has patient had a PCN reaction that required hospitalization:No Has patient had a PCN reaction occurring within the last 10 years: No If all of the above answers are "NO", then may proceed with Cephalosporin use.   Pseudoephedrine Rash   Sulfamethoxazole-trimethoprim Rash        Medication List        Accurate as of August 02, 2023  1:40 PM. If you have any questions, ask your nurse or doctor.          STOP taking these medications    adalimumab 40 MG/0.4ML prefilled syringe Commonly known as: HUMIRA Stopped by: Alfonse Spruce    calcium carbonate 500 MG chewable tablet Commonly known as: TUMS - dosed in mg elemental calcium Stopped by: Alfonse Spruce   doxycycline 100 MG tablet Commonly known as: VIBRA-TABS Stopped by: Alfonse Spruce   fluticasone 50 MCG/ACT nasal spray Commonly known as: FLONASE Stopped by: Alfonse Spruce   gentamicin cream 0.1 % Commonly known as: GARAMYCIN Stopped by: Alfonse Spruce   loratadine 10 MG tablet Commonly known as: CLARITIN Stopped by: Alfonse Spruce   LORazepam 1 MG tablet Commonly known as: ATIVAN Stopped by: Alfonse Spruce   montelukast 10 MG tablet Commonly known as: SINGULAIR Stopped by: Alfonse Spruce   predniSONE 20 MG tablet Commonly known as: DELTASONE Stopped by: Alfonse Spruce       TAKE these medications    acetaminophen 500 MG tablet Commonly known as: TYLENOL Take 1,000 mg by mouth every 6 (six) hours as needed (for pain.).   buPROPion 150 MG 24 hr tablet Commonly known as: WELLBUTRIN XL Take 450 mg by mouth daily.   busPIRone 15 MG tablet Commonly known as: BUSPAR Take 15 mg by mouth 2 (two) times daily.   CELEBRATE MULTI-COMPLETE 18 PO Take 1 tablet by mouth daily.   clonazePAM 0.5 MG tablet Commonly known as: KLONOPIN TAKE 1 TO 2 TABLETS BY MOUTH EVERY DAY AS NEEDED   estradiol 0.1 MG/GM vaginal cream Commonly known as: ESTRACE INSERT 0.5 GRAM VAGINALLY 2 TIMES A WEEK AT BEDTIME   estradiol 2 MG tablet Commonly known as: ESTRACE Take 1 tablet by mouth daily.   folic acid 1 MG tablet Commonly known as: FOLVITE Take 2 mg by mouth daily.   lamoTRIgine 200 MG tablet Commonly known as: LAMICTAL Take 200 mg by mouth every evening.   methotrexate 2.5 MG tablet Commonly known as: RHEUMATREX Take 12.5 mg by mouth See admin instructions. Caution:Chemotherapy. Protect from light. On Mondays - take 5 tabs twice daily   Mydayis 50 MG Cp24 Generic drug: Amphet-Dextroamphet 3-Bead  ER Take 50 mg by mouth daily as needed (ADHD).   pantoprazole 40 MG tablet Commonly known as: PROTONIX TAKE 1 TABLET(40 MG) BY MOUTH DAILY   polyvinyl alcohol 1.4 % ophthalmic solution Commonly known as: LIQUIFILM TEARS Place 1 drop into both eyes as needed for dry eyes.   Vitamin D 125 MCG (5000 UT) Caps Take 5,000 Units by mouth daily.        Birth History: non-contributory  Developmental History: non-contributory  Past Surgical History: Past Surgical History:  Procedure Laterality Date   BIOPSY EYE MUSCLE Right    COLONOSCOPY WITH PROPOFOL N/A 03/07/2021   Procedure: COLONOSCOPY WITH PROPOFOL;  Surgeon: Hilarie Fredrickson, MD;  Location: [facility];  Service: Endoscopy;  Laterality: N/A;   DILATATION &  CURETTAGE/HYSTEROSCOPY WITH MYOSURE N/A 06/08/2016   Procedure: DILATATION & CURETTAGE/HYSTEROSCOPY;  Surgeon: Candice Camp, MD;  Location: [facility];  Service: Gynecology;  Laterality: N/A;   LAPAROSCOPIC GASTRIC SLEEVE RESECTION N/A 02/20/2017   Procedure: LAPAROSCOPIC GASTRIC SLEEVE RESECTION WITH UPPER ENDOSCOPY;  Surgeon: Luretha Murphy, MD;  Location: [facility];  Service: General;  Laterality: N/A;   POLYPECTOMY  03/07/2021   Procedure: POLYPECTOMY;  Surgeon: Hilarie Fredrickson, MD;  Location: [facility];  Service: Endoscopy;;   TILT TABLE STUDY     URETHRAL DILATION  1979   WISDOM TOOTH EXTRACTION       Family History: Family History  Problem Relation Age of Onset   Diabetes Mother    Heart disease Mother    Uterine cancer Mother    Hypertension Mother    Hyperlipidemia Mother    Kidney disease Mother    Thyroid disease Mother    Colon polyps Mother    Depression Mother    Sleep apnea Mother    Obesity Mother    Parkinsonism Mother    Heart attack Father 52   Diabetes Father    Heart disease Father    Hypertension Father    Hyperlipidemia Father    Obesity Father    Asthma Maternal Aunt    Atrial fibrillation Maternal Grandmother    Parkinson's disease  Maternal Grandmother    Colon cancer Maternal Grandfather 35   Heart disease Paternal Grandmother    Heart disease Paternal Grandfather    Esophageal cancer Neg Hx    Stomach cancer Neg Hx    Pancreatic cancer Neg Hx    Liver disease Neg Hx      Social History: Ileta lives at home with her mother whom she takes care of.  She lives in a house that is 51 years old.  There is carpeting throughout the home.  She has electric heating with central and window units for cooling.  There is a cat inside of the home.  There are no dust mite covers on the bedding.  There is no tobacco exposure.  She currently works as a Teacher, adult education.  There are no fume, chemical, or dust exposures.  There is no HEPA filter.  She has smoked on and off since 2016.   Review of systems otherwise negative other than that mentioned in the HPI.    Objective:   Blood pressure (!) 122/90, pulse (!) 54, temperature 98.2 F (36.8 C), height 5\' 6"  (1.676 m), weight (!) 309 lb 4.8 oz (140.3 kg), SpO2 97%. Body mass index is 49.92 kg/m.     Physical Exam Vitals reviewed.  Constitutional:      Appearance: She is well-developed.  HENT:     Head: Normocephalic and atraumatic.     Right Ear: Tympanic membrane, ear canal and external ear normal. No drainage, swelling or tenderness. Tympanic membrane is not injected, scarred, erythematous, retracted or bulging.     Left Ear: Tympanic membrane, ear canal and external ear normal. No drainage, swelling or tenderness. Tympanic membrane is not injected, scarred, erythematous, retracted or bulging.     Nose: No nasal deformity, septal deviation, mucosal edema, congestion or rhinorrhea.     Right Turbinates: Enlarged, swollen and pale.     Left Turbinates: Enlarged, swollen and pale.     Right Sinus: No maxillary sinus tenderness or frontal sinus tenderness.     Left Sinus: No maxillary sinus tenderness or frontal sinus tenderness.     Mouth/Throat:  Mouth: Mucous  membranes are not pale and not dry.     Pharynx: Uvula midline.  Eyes:     General: Lids are normal. Allergic shiner present.        Right eye: No discharge.        Left eye: No discharge.     Conjunctiva/sclera: Conjunctivae normal.     Right eye: Right conjunctiva is not injected. No chemosis.    Left eye: Left conjunctiva is not injected. No chemosis.    Pupils: Pupils are equal, round, and reactive to light.  Cardiovascular:     Rate and Rhythm: Normal rate and regular rhythm.     Heart sounds: Normal heart sounds.  Pulmonary:     Effort: Pulmonary effort is normal. No tachypnea, accessory muscle usage or respiratory distress.     Breath sounds: Normal breath sounds. No wheezing, rhonchi or rales.     Comments: Moving air well in all lung fields. No increased work of breathing noted.  Chest:     Chest wall: No tenderness.  Abdominal:     Tenderness: There is no abdominal tenderness. There is no guarding or rebound.  Lymphadenopathy:     Head:     Right side of head: No submandibular, tonsillar or occipital adenopathy.     Left side of head: No submandibular, tonsillar or occipital adenopathy.     Cervical: No cervical adenopathy.  Skin:    Coloration: Skin is not pale.     Findings: No abrasion, erythema, petechiae or rash. Rash is not papular, urticarial or vesicular.  Neurological:     Mental Status: She is alert.  Psychiatric:        Behavior: Behavior is cooperative.      Diagnostic studies: deferred due to insurance stipulations that require a separate visit for testing         Malachi Bonds, MD [facility]

## 2023-08-06 DIAGNOSIS — F330 Major depressive disorder, recurrent, mild: Secondary | ICD-10-CM | POA: Diagnosis not present

## 2023-08-06 DIAGNOSIS — F509 Eating disorder, unspecified: Secondary | ICD-10-CM | POA: Diagnosis not present

## 2023-08-06 DIAGNOSIS — F411 Generalized anxiety disorder: Secondary | ICD-10-CM | POA: Diagnosis not present

## 2023-08-08 DIAGNOSIS — F509 Eating disorder, unspecified: Secondary | ICD-10-CM | POA: Diagnosis not present

## 2023-08-08 DIAGNOSIS — F411 Generalized anxiety disorder: Secondary | ICD-10-CM | POA: Diagnosis not present

## 2023-08-08 DIAGNOSIS — F330 Major depressive disorder, recurrent, mild: Secondary | ICD-10-CM | POA: Diagnosis not present

## 2023-08-13 DIAGNOSIS — F509 Eating disorder, unspecified: Secondary | ICD-10-CM | POA: Diagnosis not present

## 2023-08-13 DIAGNOSIS — F330 Major depressive disorder, recurrent, mild: Secondary | ICD-10-CM | POA: Diagnosis not present

## 2023-08-13 DIAGNOSIS — F411 Generalized anxiety disorder: Secondary | ICD-10-CM | POA: Diagnosis not present

## 2023-08-16 ENCOUNTER — Ambulatory Visit: Admitting: Allergy & Immunology

## 2023-08-16 ENCOUNTER — Encounter: Payer: Self-pay | Admitting: Allergy & Immunology

## 2023-08-16 DIAGNOSIS — J302 Other seasonal allergic rhinitis: Secondary | ICD-10-CM

## 2023-08-16 DIAGNOSIS — J3089 Other allergic rhinitis: Secondary | ICD-10-CM | POA: Diagnosis not present

## 2023-08-16 MED ORDER — RYALTRIS 665-25 MCG/ACT NA SUSP: 2.0000 | Freq: Two times a day (BID) | NASAL | Status: AC | PRN

## 2023-08-16 NOTE — Progress Notes (Signed)
 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 Tami Ribas)   GERD - on pantoprazole   Massage therapist   Plan/Recommendations:   1. Chronic rhinitis - Testing today showed: indoor molds and outdoor molds - Copy of test results provided.  - Avoidance measures provided. - Stop taking: current medications - Start taking: Ryaltris (olopatadine/mometasone) two sprays per nostril 1-2 times daily as needed - You can use an extra dose of the antihistamine, if needed, for breakthrough symptoms.  - Consider nasal saline rinses 1-2 times daily to remove allergens from the nasal cavities as well as help with mucous clearance (this is especially helpful to do before the nasal sprays are given) - I do not think that allergy shots would be useful for you since your testing was not super excited.  - We are going to refer you to see the [facility] group for another opinion.   2. Adverse food reaction - Testing was negative to the most common foods at the next visit and cantaloupe.  - There is a the low positive predictive value of food allergy testing and hence the high possibility of false positives. - In contrast, food allergy testing has a high negative predictive value, therefore if testing is negative we can be relatively assured that they are indeed negative.   3. Return in about 3 months (around 11/16/2023). You can have the follow up appointment with Dr. Dellis Anes or a Nurse Practicioner (our Nurse Practitioners are excellent and always have Physician oversight!).    Subjective:   Yvette White is a 53 y.o. female presenting today for follow up of No chief complaint on file.   Yvette White has a history  of the following: Patient Active Problem List   Diagnosis Date Noted   Encounter for colorectal cancer screening    Benign neoplasm of cecum    Situational anxiety 12/24/2018   Idiopathic orbital inflammatory syndrome 12/31/2017   Migraine without aura and without status migrainosus, not intractable 12/31/2017   Chronic thoracic back pain 09/27/2017   Low back pain 09/27/2017   Former smoker, quit in 2016 07/29/2017   Bladder spasms 07/29/2017   History of abnormal mammogram 07/29/2017   Thyroid condition, history of low TSH that normalized with monitoring 07/29/2017   ADD (attention deficit disorder)    OSA on CPAP    Vitamin D deficiency 05/02/2017   Pre-diabetes 05/02/2017   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  Discussed the use of AI scribe software for clinical note transcription with the patient and/or guardian, who gave verbal consent to proceed.  Yvette White is a 53 y.o. female presenting for skin testing. She was last seen on March 6th. We could not do testing because her insurance company does not  cover testing on the same day as a New Patient visit. She has been off of all antihistamines 3 days in anticipation of the testing.   Otherwise, there have been no changes to her past medical history, surgical history, family history, or social history.    Review of systems otherwise negative other than that mentioned in the HPI.    Objective:   There were no vitals  taken for this visit. There is no height or weight on file to calculate BMI.    Physical exam deferred since this was a skin testing appointment only.   Diagnostic studies:   Allergy Studies:     Airborne Adult Perc - 08/16/23 1000     Time Antigen Placed 1038    Allergen Manufacturer Waynette Buttery    Location Back    Number of Test 55    Panel 1 Select    1. Control-Buffer 50% Glycerol Negative    2. Control-Histamine 3+    3. Bahia Negative    4. French Southern Territories Negative    5. Johnson Negative    6. Kentucky Blue Negative    7. Meadow Fescue Negative    8. Perennial Rye Negative    9. Timothy Negative    10. Ragweed Mix Negative    11. Cocklebur Negative    12. Plantain,  English Negative    13. Baccharis Negative    14. Dog Fennel Negative    15. Russian Thistle Negative    16. Lamb's Quarters Negative    17. Sheep Sorrell Negative    18. Rough Pigweed Negative    19. Marsh Elder, Rough Negative    20. Mugwort, Common Negative    21. Box, Elder Negative    22. Cedar, red Negative    23. Sweet Gum Negative    24. Pecan Pollen Negative    25. Pine Mix Negative    26. Walnut, Black Pollen Negative    27. Red Mulberry Negative    28. Ash Mix Negative    29. Birch Mix Negative    30. Beech American Negative    31. Cottonwood, [place] Negative    32. Hickory, White Negative    33. Maple Mix Negative    34. Oak, [place] Mix Negative    35. Sycamore Eastern Negative    36. Alternaria Alternata Negative    37. Cladosporium Herbarum Negative    38. Aspergillus Mix Negative    39. Penicillium Mix Negative    40. Bipolaris Sorokiniana (Helminthosporium) Negative    41. Drechslera Spicifera (Curvularia) Negative    42. Mucor Plumbeus Negative    43. Fusarium Moniliforme Negative    44. Aureobasidium Pullulans (pullulara) Negative    45. Rhizopus Oryzae Negative    46. Botrytis Cinera Negative    47. Epicoccum Nigrum Negative    48. Phoma Betae Negative    49. Dust Mite Mix  Negative    50. Cat Hair 10,000 BAU/ml Negative    51.  Dog Epithelia Negative    52. Mixed Feathers Negative    53. Horse Epithelia Negative    54. Cockroach, German Negative    55. Tobacco Leaf Negative             Intradermal - 08/16/23 1114     Time Antigen Placed 1115    Allergen Manufacturer Waynette Buttery    Location Arm    Number of Test 16    Control Negative    Bahia Negative    French Southern Territories Negative  Johnson Negative    7 Grass Negative    Ragweed Mix Negative    Weed Mix Negative    Tree Mix Negative    Mold 1 Negative    Mold 2 2+    Mold 3 3+    Mold 4 Negative    Mite Mix Negative    Cat Negative    Dog Negative    Cockroach Negative             Food Adult Perc - 08/16/23 1000     Time Antigen Placed 1038    Allergen Manufacturer Greer    Location Back    Number of allergen test 18    1. Peanut Negative    2. Soybean Negative    3. Wheat Negative    4. Sesame Negative    5. Milk, Cow Negative    6. Casein Negative    7. Egg White, Chicken Negative    8. Shellfish Mix Negative    9. Fish Mix Negative    10. Cashew Negative    11. Walnut Food Negative    12. Almond Negative    13. Hazelnut Negative    14. Pecan Food Negative    15. Pistachio Negative    16. [place] Nut Negative    17. Coconut Negative    63. Cantaloupe Negative             Allergy testing results were read and interpreted by myself, documented by clinical staff.      Malachi Bonds, MD  [facility]

## 2023-08-16 NOTE — Progress Notes (Signed)
 Medication Samples have been provided to the patient.  Drug name: Ryaltris       Strength: 667mcg/25mcg        Qty: 1  LOT: [id number]  Exp.Date: 08/26/24  Dosing instructions: 1-2 daily as needed  The patient has been instructed regarding the correct time, dose, and frequency of taking this medication, including desired effects and most common side effects.   Lynnae Sandhoff Flonnie Wierman 12:01 PM 08/16/2023

## 2023-08-16 NOTE — Patient Instructions (Addendum)
 1. Chronic rhinitis - Testing today showed: indoor molds and outdoor molds - Copy of test results provided.  - Avoidance measures provided. - Stop taking: current medications - Start taking: Ryaltris (olopatadine/mometasone) two sprays per nostril 1-2 times daily as needed - You can use an extra dose of the antihistamine, if needed, for breakthrough symptoms.  - Consider nasal saline rinses 1-2 times daily to remove allergens from the nasal cavities as well as help with mucous clearance (this is especially helpful to do before the nasal sprays are given) - I do not think that allergy shots would be useful for you since your testing was not super excited.  - We are going to refer you to see the [facility] ENT group for another opinion.   2. Adverse food reaction - Testing was negative to the most common foods at the next visit and cantaloupe.  - There is a the low positive predictive value of food allergy testing and hence the high possibility of false positives. - In contrast, food allergy testing has a high negative predictive value, therefore if testing is negative we can be relatively assured that they are indeed negative.   3. Return in about 3 months (around 11/16/2023). You can have the follow up appointment with Dr. Dellis Anes or a Nurse Practicioner (our Nurse Practitioners are excellent and always have Physician oversight!).    Please inform us of any Emergency Department visits, hospitalizations, or changes in symptoms. Call us before going to the ED for breathing or allergy symptoms since we might be able to fit you in for a sick visit. Feel free to contact us anytime with any questions, problems, or concerns.  It was a pleasure to see you again today!  Websites that have reliable patient information: 1. American Academy of Asthma, Allergy, and Immunology: www.aaaai.org 2. Food Allergy Research and Education (FARE): foodallergy.org 3. Mothers of Asthmatics:  http://www.asthmacommunitynetwork.org 4. American College of Allergy, Asthma, and Immunology: www.acaai.org      "Like" [place] on Facebook and Instagram for our latest updates!      A healthy democracy works best when [employer] participate! Make sure you are registered to vote! If you have moved or changed any of your contact information, you will need to get this updated before voting! Scan the QR codes below to learn more!      Airborne Adult Perc - 08/16/23 1000     Time Antigen Placed 1038    Allergen Manufacturer Greer    Location Back    Number of Test 55    Panel 1 Select    1. Control-Buffer 50% Glycerol Negative    2. Control-Histamine 3+    3. Bahia Negative    4. French Southern Territories Negative    5. Johnson Negative    6. Kentucky Blue Negative    7. Meadow Fescue Negative    8. Perennial Rye Negative    9. Timothy Negative    10. Ragweed Mix Negative    11. Cocklebur Negative    12. Plantain,  English Negative    13. Baccharis Negative    14. Dog Fennel Negative    15. Russian Thistle Negative    16. Lamb's Quarters Negative    17. Sheep Sorrell Negative    18. Rough Pigweed Negative    19. Marsh Elder, Rough Negative    20. Mugwort, Common Negative    21. Box, Elder Negative    22. Cedar, red Negative    23. Sweet Gum Negative  24. Pecan Pollen Negative    25. Pine Mix Negative    26. Walnut, Black Pollen Negative    27. Red Mulberry Negative    28. Ash Mix Negative    29. Birch Mix Negative    30. Beech American Negative    31. Cottonwood, Guinea-[place]iss[place] Negative    32. Hickory, White Negative    33. Maple Mix Negative    34. Oak, [place] Mix Negative    35. Sycamore Eastern Negative    36. Alternaria Alternata Negative    37. Cladosporium Herbarum Negative    38. Aspergillus Mix Negative    39. Penicillium Mix Negative    40. Bipolaris Sorokiniana (Helminthosporium) Negative    41. Drechslera Spicifera (Curvularia) Negative    42. Mucor Plumbeus Negative     43. Fusarium Moniliforme Negative    44. Aureobasidium Pullulans (pullulara) Negative    45. Rhizopus Oryzae Negative    46. Botrytis Cinera Negative    47. Epicoccum Nigrum Negative    48. Phoma Betae Negative    49. Dust Mite Mix Negative    50. Cat Hair 10,000 BAU/ml Negative    51.  Dog Epithelia Negative    52. Mixed Feathers Negative    53. Horse Epithelia Negative    54. Cockroach, German Negative    55. Tobacco Leaf Negative             Intradermal - 08/16/23 1114     Time Antigen Placed 1115    Allergen Manufacturer Waynette Buttery    Location Arm    Number of Test 16    Control Negative    Bahia Negative    French Southern Territories Negative    Johnson Negative    7 Grass Negative    Ragweed Mix Negative    Weed Mix Negative    Tree Mix Negative    Mold 1 Negative    Mold 2 2+    Mold 3 3+    Mold 4 Negative    Mite Mix Negative    Cat Negative    Dog Negative    Cockroach Negative             Food Adult Perc - 08/16/23 1000     Time Antigen Placed 1038    Allergen Manufacturer Greer    Location Back    Number of allergen test 18    1. Peanut Negative    2. Soybean Negative    3. Wheat Negative    4. Sesame Negative    5. Milk, Cow Negative    6. Casein Negative    7. Egg White, Chicken Negative    8. Shellfish Mix Negative    9. Fish Mix Negative    10. Cashew Negative    11. Walnut Food Negative    12. Almond Negative    13. Hazelnut Negative    14. Pecan Food Negative    15. Pistachio Negative    16. [place] Nut Negative    17. Coconut Negative    63. Cantaloupe Negative             Control of Mold Allergen   Mold and fungi can grow on a variety of surfaces provided certain temperature and moisture conditions exist.  Outdoor molds grow on plants, decaying vegetation and soil.  The major outdoor mold, Alternaria and Cladosporium, are found in very high numbers during hot and dry conditions.  Generally, a late Summer - Fall peak is seen for common outdoor  fungal spores.  Rain will temporarily lower outdoor mold spore count, but counts rise rapidly when the rainy period ends.  The most important indoor molds are Aspergillus and Penicillium.  Dark, humid and poorly ventilated basements are ideal sites for mold growth.  The next most common sites of mold growth are the bathroom and the kitchen.  Outdoor (Seasonal) Mold Control  Positive outdoor molds via skin testing: Bipolaris (Helminthsporium), Drechslera (Curvalaria), and Mucor  Use air conditioning and keep windows closed Avoid exposure to decaying vegetation. Avoid leaf raking. Avoid grain handling. Consider wearing a face mask if working in moldy areas.    Indoor (Perennial) Mold Control   Positive indoor molds via skin testing: Fusarium, Aureobasidium (Pullulara), and Rhizopus  Maintain humidity below 50%. Clean washable surfaces with 5% bleach solution. Remove sources e.g. contaminated carpets.

## 2023-08-20 DIAGNOSIS — F411 Generalized anxiety disorder: Secondary | ICD-10-CM | POA: Diagnosis not present

## 2023-08-20 DIAGNOSIS — F509 Eating disorder, unspecified: Secondary | ICD-10-CM | POA: Diagnosis not present

## 2023-08-20 DIAGNOSIS — F330 Major depressive disorder, recurrent, mild: Secondary | ICD-10-CM | POA: Diagnosis not present

## 2023-08-29 DIAGNOSIS — F330 Major depressive disorder, recurrent, mild: Secondary | ICD-10-CM | POA: Diagnosis not present

## 2023-08-29 DIAGNOSIS — F411 Generalized anxiety disorder: Secondary | ICD-10-CM | POA: Diagnosis not present

## 2023-08-29 DIAGNOSIS — F509 Eating disorder, unspecified: Secondary | ICD-10-CM | POA: Diagnosis not present

## 2023-09-03 DIAGNOSIS — F330 Major depressive disorder, recurrent, mild: Secondary | ICD-10-CM | POA: Diagnosis not present

## 2023-09-03 DIAGNOSIS — F509 Eating disorder, unspecified: Secondary | ICD-10-CM | POA: Diagnosis not present

## 2023-09-03 DIAGNOSIS — F411 Generalized anxiety disorder: Secondary | ICD-10-CM | POA: Diagnosis not present

## 2023-09-10 DIAGNOSIS — F411 Generalized anxiety disorder: Secondary | ICD-10-CM | POA: Diagnosis not present

## 2023-09-10 DIAGNOSIS — F509 Eating disorder, unspecified: Secondary | ICD-10-CM | POA: Diagnosis not present

## 2023-09-10 DIAGNOSIS — F330 Major depressive disorder, recurrent, mild: Secondary | ICD-10-CM | POA: Diagnosis not present

## 2023-09-12 ENCOUNTER — Encounter (HOSPITAL_COMMUNITY): Payer: Self-pay | Admitting: *Deleted

## 2023-09-17 DIAGNOSIS — F411 Generalized anxiety disorder: Secondary | ICD-10-CM | POA: Diagnosis not present

## 2023-09-17 DIAGNOSIS — F509 Eating disorder, unspecified: Secondary | ICD-10-CM | POA: Diagnosis not present

## 2023-09-17 DIAGNOSIS — F330 Major depressive disorder, recurrent, mild: Secondary | ICD-10-CM | POA: Diagnosis not present

## 2023-09-24 DIAGNOSIS — F509 Eating disorder, unspecified: Secondary | ICD-10-CM | POA: Diagnosis not present

## 2023-09-24 DIAGNOSIS — F411 Generalized anxiety disorder: Secondary | ICD-10-CM | POA: Diagnosis not present

## 2023-09-24 DIAGNOSIS — F330 Major depressive disorder, recurrent, mild: Secondary | ICD-10-CM | POA: Diagnosis not present

## 2023-09-27 DIAGNOSIS — F330 Major depressive disorder, recurrent, mild: Secondary | ICD-10-CM | POA: Diagnosis not present

## 2023-09-27 DIAGNOSIS — F411 Generalized anxiety disorder: Secondary | ICD-10-CM | POA: Diagnosis not present

## 2023-09-27 DIAGNOSIS — F509 Eating disorder, unspecified: Secondary | ICD-10-CM | POA: Diagnosis not present

## 2023-09-29 DIAGNOSIS — R059 Cough, unspecified: Secondary | ICD-10-CM | POA: Diagnosis not present

## 2023-09-29 DIAGNOSIS — Z20822 Contact with and (suspected) exposure to covid-19: Secondary | ICD-10-CM | POA: Diagnosis not present

## 2023-09-29 DIAGNOSIS — R0981 Nasal congestion: Secondary | ICD-10-CM | POA: Diagnosis not present

## 2023-09-29 DIAGNOSIS — J069 Acute upper respiratory infection, unspecified: Secondary | ICD-10-CM | POA: Diagnosis not present

## 2023-10-01 DIAGNOSIS — F509 Eating disorder, unspecified: Secondary | ICD-10-CM | POA: Diagnosis not present

## 2023-10-01 DIAGNOSIS — F411 Generalized anxiety disorder: Secondary | ICD-10-CM | POA: Diagnosis not present

## 2023-10-01 DIAGNOSIS — F330 Major depressive disorder, recurrent, mild: Secondary | ICD-10-CM | POA: Diagnosis not present

## 2023-10-09 DIAGNOSIS — L723 Sebaceous cyst: Secondary | ICD-10-CM | POA: Diagnosis not present

## 2023-10-09 DIAGNOSIS — D171 Benign lipomatous neoplasm of skin and subcutaneous tissue of trunk: Secondary | ICD-10-CM | POA: Diagnosis not present

## 2023-10-09 DIAGNOSIS — L905 Scar conditions and fibrosis of skin: Secondary | ICD-10-CM | POA: Diagnosis not present

## 2023-10-09 DIAGNOSIS — D225 Melanocytic nevi of trunk: Secondary | ICD-10-CM | POA: Diagnosis not present

## 2023-10-10 DIAGNOSIS — F411 Generalized anxiety disorder: Secondary | ICD-10-CM | POA: Diagnosis not present

## 2023-10-10 DIAGNOSIS — F330 Major depressive disorder, recurrent, mild: Secondary | ICD-10-CM | POA: Diagnosis not present

## 2023-10-10 DIAGNOSIS — F509 Eating disorder, unspecified: Secondary | ICD-10-CM | POA: Diagnosis not present

## 2023-10-11 DIAGNOSIS — F900 Attention-deficit hyperactivity disorder, predominantly inattentive type: Secondary | ICD-10-CM | POA: Diagnosis not present

## 2023-10-11 DIAGNOSIS — F3189 Other bipolar disorder: Secondary | ICD-10-CM | POA: Diagnosis not present

## 2023-10-11 DIAGNOSIS — F411 Generalized anxiety disorder: Secondary | ICD-10-CM | POA: Diagnosis not present

## 2023-10-11 DIAGNOSIS — F3131 Bipolar disorder, current episode depressed, mild: Secondary | ICD-10-CM | POA: Diagnosis not present

## 2023-10-11 DIAGNOSIS — J069 Acute upper respiratory infection, unspecified: Secondary | ICD-10-CM | POA: Diagnosis not present

## 2023-10-15 DIAGNOSIS — F330 Major depressive disorder, recurrent, mild: Secondary | ICD-10-CM | POA: Diagnosis not present

## 2023-10-15 DIAGNOSIS — F411 Generalized anxiety disorder: Secondary | ICD-10-CM | POA: Diagnosis not present

## 2023-10-15 DIAGNOSIS — F509 Eating disorder, unspecified: Secondary | ICD-10-CM | POA: Diagnosis not present

## 2023-10-25 ENCOUNTER — Telehealth (INDEPENDENT_AMBULATORY_CARE_PROVIDER_SITE_OTHER): Payer: Self-pay | Admitting: Otolaryngology

## 2023-10-25 NOTE — Telephone Encounter (Signed)
 Left Vm with location and appt information

## 2023-10-26 ENCOUNTER — Ambulatory Visit (INDEPENDENT_AMBULATORY_CARE_PROVIDER_SITE_OTHER): Admitting: Otolaryngology

## 2023-10-26 VITALS — BP 123/77 | HR 67

## 2023-10-26 DIAGNOSIS — R0982 Postnasal drip: Secondary | ICD-10-CM | POA: Diagnosis not present

## 2023-10-26 DIAGNOSIS — R0981 Nasal congestion: Secondary | ICD-10-CM

## 2023-10-26 DIAGNOSIS — G4733 Obstructive sleep apnea (adult) (pediatric): Secondary | ICD-10-CM

## 2023-10-26 DIAGNOSIS — H05129 Orbital myositis, unspecified orbit: Secondary | ICD-10-CM

## 2023-10-26 DIAGNOSIS — H6993 Unspecified Eustachian tube disorder, bilateral: Secondary | ICD-10-CM | POA: Diagnosis not present

## 2023-10-26 DIAGNOSIS — J343 Hypertrophy of nasal turbinates: Secondary | ICD-10-CM

## 2023-10-26 DIAGNOSIS — J300 Vasomotor rhinitis: Secondary | ICD-10-CM

## 2023-10-26 DIAGNOSIS — J342 Deviated nasal septum: Secondary | ICD-10-CM

## 2023-10-26 DIAGNOSIS — K219 Gastro-esophageal reflux disease without esophagitis: Secondary | ICD-10-CM | POA: Diagnosis not present

## 2023-10-26 MED ORDER — IPRATROPIUM BROMIDE 0.03 % NA SOLN: 2.0000 | Freq: Two times a day (BID) | NASAL | 12 refills | Status: AC

## 2023-10-26 MED ORDER — FLUTICASONE PROPIONATE 50 MCG/ACT NA SUSP: 2.0000 | Freq: Two times a day (BID) | NASAL | 6 refills | Status: AC

## 2023-10-26 MED ORDER — AZELASTINE HCL 0.1 % NA SOLN: 2.0000 | Freq: Two times a day (BID) | NASAL | 12 refills | Status: AC

## 2023-10-26 MED ORDER — LEVOCETIRIZINE DIHYDROCHLORIDE 5 MG PO TABS: 5.0000 mg | ORAL_TABLET | Freq: Every evening | ORAL | 3 refills | Status: AC

## 2023-10-26 NOTE — Progress Notes (Signed)
 ENT CONSULT:  Reason for Consult: chronic nasal congestion and post-nasal drainage ear pressure   HPI: Discussed the use of AI scribe software for clinical note transcription with the patient, who gave verbal consent to proceed.  History of Present Illness Yvette White is a 53 year old female with idiopathic orbital myositis, on methotrexate,  who presents with chronic nasal congestion and ear pressure   She has experienced sinus issues since childhood, with significant sinus pressure and headaches over the last ten years. The headaches occur around the eyes, while the daily pressure is felt in the nose and under the eyes. She has a history of sinus infections requiring antibiotics, but these have subsided since she quit smoking. Recent allergy  testing showed no significant allergies, except for a couple of molds. She used Ryaltris  nasal spray for a short period but discontinued it due to a cold. She is not currently taking any allergy  medications like Zyrtec or Allegra, but used Benadryl during a recent cold due to an allergy  to decongestants.  She experiences ear pressure and discomfort, particularly when lying down, which she attributes to sinus pressure. Her ears often feel full, and she has had her hearing tested by an audiologist. Discomfort is noted when the ears feel particularly full. She reports pressure in the ears, especially after using a neti pot during a recent cold. No current ear pain is reported.  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  She has a history of heartburn or reflux, for which she takes Protonix . She previously used a CPAP mask but no longer does.  Records Reviewed:  Allergy  Office Visit 08/02/23 1. Chronic rhinitis - Because of insurance stipulations, we cannot do skin testing on the same day as your first visit. - We are all working to  fight this, but for now we need to do two separate visits.  - We will know more after we do testing at the next visit.  - The skin testing visit can be squeezed in at your convenience.  - Then we can make a more full plan to address all of your symptoms. - Be sure to stop your antihistamines for 3 days before this appointment.    2. Adverse food reaction - We can test for the most common foods at the next visit. - This will rule out more than 95% of all food allergens. - Food testing sheet provided (there are high FALSE POSITIVE rates, so be careful with choosing).     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 France Ina)   GERD - on pantoprazole    Massage therapist   Chronic rhinitis - Testing today showed: indoor molds and outdoor molds - Copy of test results provided.  - Avoidance measures provided. - Stop taking: current medications - Start taking: Ryaltris  (olopatadine/mometasone) two sprays per nostril 1-2 times daily as needed - You can use an extra dose of the antihistamine, if needed, for breakthrough symptoms.  - Consider nasal saline rinses 1-2 times daily to remove allergens from the nasal cavities as well as help with mucous clearance (this is especially helpful to do before the nasal sprays are given) - I do not think that allergy  shots  would be useful for you since your testing was not super excited.  - We are going to refer you to see the [facility] group for another opinion.    Past Medical History:  Diagnosis Date   ADD (attention deficit disorder), controlled with Adderall    Anemia    Anxiety    Arthritis    Chicken pox    Depression    Disorder of thyroid     GERD (gastroesophageal reflux disease)    Heart murmur    Hyperthyroidism     Menstrual disorder    uterine fibriods, polyps,cysts   Migraine    Morbid obesity (HCC)    Neurocardiogenic syncope    OSA on CPAP    Pre-diabetes    Swallowing difficulty    Vitamin D  deficiency     Past Surgical History:  Procedure Laterality Date   BIOPSY EYE MUSCLE Right    COLONOSCOPY WITH PROPOFOL  N/A 03/07/2021   Procedure: COLONOSCOPY WITH PROPOFOL ;  Surgeon: Tobin Forts, MD;  Location: [facility];  Service: Endoscopy;  Laterality: N/A;   DILATATION & CURETTAGE/HYSTEROSCOPY WITH MYOSURE N/A 06/08/2016   Procedure: DILATATION & CURETTAGE/HYSTEROSCOPY;  Surgeon: Belle Box, MD;  Location: [facility];  Service: Gynecology;  Laterality: N/A;   LAPAROSCOPIC GASTRIC SLEEVE RESECTION N/A 02/20/2017   Procedure: LAPAROSCOPIC GASTRIC SLEEVE RESECTION WITH UPPER ENDOSCOPY;  Surgeon: Jacolyn Matar, MD;  Location: [facility];  Service: General;  Laterality: N/A;   POLYPECTOMY  03/07/2021   Procedure: POLYPECTOMY;  Surgeon: Tobin Forts, MD;  Location: [facility];  Service: Endoscopy;;   TILT TABLE STUDY     URETHRAL DILATION  1979   WISDOM TOOTH EXTRACTION      Family History  Problem Relation Age of Onset   Diabetes Mother    Heart disease Mother    Uterine cancer Mother    Hypertension Mother    Hyperlipidemia Mother    Kidney disease Mother    Thyroid  disease Mother    Colon polyps Mother    Depression Mother    Sleep apnea Mother    Obesity Mother    Parkinsonism Mother    Heart attack Father 53   Diabetes Father    Heart disease Father    Hypertension Father    Hyperlipidemia Father    Obesity Father    Asthma Maternal Aunt    Atrial fibrillation Maternal Grandmother    Parkinson's disease Maternal Grandmother    Colon cancer Maternal Grandfather 50   Heart disease Paternal Grandmother    Heart disease Paternal Grandfather    Esophageal cancer Neg Hx    Stomach cancer Neg Hx    Pancreatic cancer Neg Hx    Liver disease Neg Hx     Social History:  reports  that she quit smoking about 9 years ago. Her smoking use included cigarettes. She started smoking about 31 years ago. She has a 5.6 pack-year smoking history. She has been exposed to tobacco smoke. She has never used smokeless tobacco. She reports current alcohol use. She reports that she does not use drugs.  Allergies:  Allergies  Allergen Reactions   Sulfamethoxazole-Trimethoprim Rash and Dermatitis   Theophylline     Other reaction(s): Other (See Comments) Hyperactive  "Slo phylinne"   Penicillins Rash    Has patient had a PCN reaction causing immediate rash, facial/tongue/throat swelling, SOB or lightheadedness with hypotension:unsure Has patient had a PCN reaction causing severe rash involving mucus membranes or skin necrosis:unsure Has patient had a PCN  reaction that required hospitalization:No Has patient had a PCN reaction occurring within the last 10 years: No If all of the above answers are "NO", then may proceed with Cephalosporin use.    Pseudoephedrine Rash    Medications: I have reviewed the patient's current medications.  The PMH, PSH, Medications, Allergies, and SH were reviewed and updated.  ROS: Constitutional: Negative for fever, weight loss and weight gain. Cardiovascular: Negative for chest pain and dyspnea on exertion. Respiratory: Is not experiencing shortness of breath at rest. Gastrointestinal: Negative for nausea and vomiting. Neurological: Negative for headaches. Psychiatric: The patient is not nervous/anxious  Blood pressure 123/77, pulse 67, SpO2 94%. There is no height or weight on file to calculate BMI.  PHYSICAL EXAM:  Exam: General: Well-developed, well-nourished Respiratory Respiratory effort: Equal inspiration and expiration without stridor Cardiovascular Peripheral Vascular: Warm extremities with equal color/perfusion Eyes: No nystagmus with equal extraocular motion bilaterally Neuro/Psych/Balance: Patient oriented to person, place, and  time; Appropriate mood and affect; Gait is intact with no imbalance; Cranial nerves I-XII are intact Head and Face Inspection: Normocephalic and atraumatic without mass or lesion Palpation: Facial skeleton intact without bony stepoffs Salivary Glands: No mass or tenderness Facial Strength: Facial motility symmetric and full bilaterally ENT Pinna: External ear intact and fully developed External canal: Canal is patent with intact skin Tympanic Membrane: Clear and mobile External Nose: No scar or anatomic deformity Internal Nose: Septum is S-shaped. No polyp, or purulence. Mucosal edema and erythema present.  Bilateral inferior turbinate hypertrophy.  Lips, Teeth, and gums: Mucosa and teeth intact and viable TMJ: No pain to palpation with full mobility Oral cavity/oropharynx: No erythema or exudate, no lesions present Nasopharynx: No mass or lesion with intact mucosa Hypopharynx: Intact mucosa without pooling of secretions Larynx Glottic: Full true vocal cord mobility without lesion or mass Supraglottic: Normal appearing epiglottis and AE folds Interarytenoid Space: Moderate pachydermia&edema Subglottic Space: Patent without lesion or edema Neck Neck and Trachea: Midline trachea without mass or lesion Thyroid : No mass or nodularity Lymphatics: No lymphadenopathy  Procedure:   PROCEDURE NOTE: nasal endoscopy  Preoperative diagnosis: chronic nasal congestion symptoms  Postoperative diagnosis: same  Procedure: Diagnostic nasal endoscopy (09811)  Surgeon: Artice Last, M.D.  Anesthesia: Topical lidocaine  and Afrin  H&P REVIEW: The patient's history and physical were reviewed today prior to procedure. All medications were reviewed and updated as well. Complications: None Condition is stable throughout exam Indications and consent: The patient presents with symptoms of chronic sinusitis not responding to previous therapies. All the risks, benefits, and potential complications  were reviewed with the patient preoperatively and informed consent was obtained. The time out was completed with confirmation of the correct procedure.   Procedure: The patient was seated upright in the clinic. Topical lidocaine  and Afrin were applied to the nasal cavity. After adequate anesthesia had occurred, the rigid nasal endoscope was passed into the nasal cavity. The nasal mucosa, turbinates, septum, and sinus drainage pathways were visualized bilaterally. This revealed no purulence or significant secretions that might be cultured. There were no polyps or sites of significant inflammation. The mucosa was intact and there was no crusting present. The scope was then slowly withdrawn and the patient tolerated the procedure well. There were no complications or blood loss.      Studies Reviewed: Allergy  testing 07/2023   Assessment/Plan: Encounter Diagnoses  Name Primary?   Vasomotor rhinitis Yes   Obstructive sleep apnea    Chronic GERD    Post-nasal drip    Chronic  nasal congestion    Nasal septal deviation    Hypertrophy of both inferior nasal turbinates    Dysfunction of both eustachian tubes     Assessment and Plan Assessment & Plan Chronic nasal congestion allergies to molds, possible Vasomotor rhinitis vs seasonal allergic rhinitis Chronic nasal congestion and drainage. Allergy  testing negative except allergies to mold. Environmental factors may exacerbate. Nasal endoscopy today unremarkable. No hx of recurrent sinus infections to warrant CT sinuses.  - Prescribed Flonase  and azelastine  nasal sprays to be used twice daily. - Prescribe Xyzal  at night. - If symptoms persist, discontinue Flonase  and initiate Atrovent  nasal spray and continue Azelastine    Eustachian tube dysfunction Intermittent ear pressure and fullness likely due to eustachian tube dysfunction related to nasal congestion. No current ear infection or fluid. - Provided educational materials on eustachian tube  dysfunction. - Manage nasal congestion to alleviate symptoms.  Gastroesophageal reflux disease (GERD) LPR GERD may contribute to chronic nasal congestion through silent reflux. Current management includes Protonix . Discussed dietary modifications and seaweed-based supplement as alternative approach. - Continue Protonix . - Consider dietary modifications and timing of meals. - Consider using a seaweed-based supplement post-meals to manage reflux - reflux gourmet  Idiopathic orbital myositis Idiopathic orbital myositis affecting two of the four eye muscles, previously managed with prednisone  and currently on methotrexate.      Thank you for allowing me to participate in the care of this patient. Please do not hesitate to contact me with any questions or concerns.   Artice Last, MD Otolaryngology [facility] Phone: [phone] Fax: [phone]    10/26/2023, 11:14 AM

## 2023-10-26 NOTE — Patient Instructions (Addendum)
 [email or web address] - check out this website to learn more about reflux   -Avoid lying down for at least two hours after a meal or after drinking acidic beverages, like soda, or other caffeinated beverages. This can help to prevent stomach contents from flowing back into the esophagus. -Keep your head elevated while you sleep. Using an extra pillow or two can also help to prevent reflux. -Eat smaller and more frequent meals each day instead of a few large meals. This promotes digestion and can aid in preventing heartburn. -Wear loose-fitting clothes to ease pressure on the stomach, which can worsen heartburn and reflux. -Reduce excess weight around the midsection. This can ease pressure on the stomach. Such pressure can force some stomach contents back up the esophagus - Take Reflux Gourmet (natural supplement available on Amazon) to help with symptoms of chronic throat irritation     See information about Eustachian Tube Dysfunction below:    Overview The eustachian (say "you-STAY-shee-un") tubes connect the middle ear on each side to the back of the throat. They keep air pressure stable in the ears. If your eustachian tubes become blocked, the air pressure in your ears changes. A quick change in air pressure can cause eustachian tubes to close up. This might happen when an airplane changes altitude or when a scuba diver goes up or down underwater. And a cold can make the tubes swell and block the fluid in the middle ear from draining out. That can cause pain.  Eustachian tube problems often clear up on their own or after treating the cause of the blockage. If your tubes continue to be blocked, you may need surgery.  Follow-up care is a key part of your treatment and safety. Be sure to make and go to all appointments, and call your doctor or nurse advice line (811 in most provinces and territories) if you are having problems. It's also a good idea to know your test results and keep a list of  the medicines you take.  How can you care for yourself at home? Try a simple exercise to help open blocked tubes. Close your mouth, hold your nose, and gently blow as if you are blowing your nose. Yawning and chewing gum also may help. You may hear or feel a "pop" when the tubes open. To ease ear pain, apply a warm face cloth or a heating pad set on low. There may be some drainage from the ear when the heat melts earwax. Put a cloth between the heat source and your skin. If your doctor prescribed antibiotics, take them as directed. Do not stop taking them just because you feel better. You need to take the full course of antibiotics. Be safe with medicines. Depending on the cause of the problem, your doctor may recommend over-the-counter medicine. For example, adults may try decongestants for cold symptoms or nasal spray steroids for allergies. Follow the instructions carefully.

## 2023-10-29 DIAGNOSIS — F330 Major depressive disorder, recurrent, mild: Secondary | ICD-10-CM | POA: Diagnosis not present

## 2023-10-29 DIAGNOSIS — F411 Generalized anxiety disorder: Secondary | ICD-10-CM | POA: Diagnosis not present

## 2023-10-31 DIAGNOSIS — F509 Eating disorder, unspecified: Secondary | ICD-10-CM | POA: Diagnosis not present

## 2023-11-05 DIAGNOSIS — F411 Generalized anxiety disorder: Secondary | ICD-10-CM | POA: Diagnosis not present

## 2023-11-05 DIAGNOSIS — F330 Major depressive disorder, recurrent, mild: Secondary | ICD-10-CM | POA: Diagnosis not present

## 2023-11-08 DIAGNOSIS — M9904 Segmental and somatic dysfunction of sacral region: Secondary | ICD-10-CM | POA: Diagnosis not present

## 2023-11-08 DIAGNOSIS — M9902 Segmental and somatic dysfunction of thoracic region: Secondary | ICD-10-CM | POA: Diagnosis not present

## 2023-11-08 DIAGNOSIS — M9903 Segmental and somatic dysfunction of lumbar region: Secondary | ICD-10-CM | POA: Diagnosis not present

## 2023-11-08 DIAGNOSIS — M9905 Segmental and somatic dysfunction of pelvic region: Secondary | ICD-10-CM | POA: Diagnosis not present

## 2023-11-12 DIAGNOSIS — F509 Eating disorder, unspecified: Secondary | ICD-10-CM | POA: Diagnosis not present

## 2023-11-12 DIAGNOSIS — F330 Major depressive disorder, recurrent, mild: Secondary | ICD-10-CM | POA: Diagnosis not present

## 2023-11-12 DIAGNOSIS — F411 Generalized anxiety disorder: Secondary | ICD-10-CM | POA: Diagnosis not present

## 2023-11-19 DIAGNOSIS — F509 Eating disorder, unspecified: Secondary | ICD-10-CM | POA: Diagnosis not present

## 2023-11-19 DIAGNOSIS — F330 Major depressive disorder, recurrent, mild: Secondary | ICD-10-CM | POA: Diagnosis not present

## 2023-11-19 DIAGNOSIS — F411 Generalized anxiety disorder: Secondary | ICD-10-CM | POA: Diagnosis not present

## 2023-11-20 DIAGNOSIS — F3189 Other bipolar disorder: Secondary | ICD-10-CM | POA: Diagnosis not present

## 2023-11-20 DIAGNOSIS — F411 Generalized anxiety disorder: Secondary | ICD-10-CM | POA: Diagnosis not present

## 2023-11-20 DIAGNOSIS — F900 Attention-deficit hyperactivity disorder, predominantly inattentive type: Secondary | ICD-10-CM | POA: Diagnosis not present

## 2023-11-21 DIAGNOSIS — F509 Eating disorder, unspecified: Secondary | ICD-10-CM | POA: Diagnosis not present

## 2023-11-21 DIAGNOSIS — F330 Major depressive disorder, recurrent, mild: Secondary | ICD-10-CM | POA: Diagnosis not present

## 2023-11-21 DIAGNOSIS — F411 Generalized anxiety disorder: Secondary | ICD-10-CM | POA: Diagnosis not present

## 2023-11-22 ENCOUNTER — Ambulatory Visit: Admitting: Allergy & Immunology

## 2023-11-23 DIAGNOSIS — Z79899 Other long term (current) drug therapy: Secondary | ICD-10-CM | POA: Diagnosis not present

## 2023-11-23 DIAGNOSIS — H0510 Unspecified chronic inflammatory disorders of orbit: Secondary | ICD-10-CM | POA: Diagnosis not present

## 2023-11-23 DIAGNOSIS — K7581 Nonalcoholic steatohepatitis (NASH): Secondary | ICD-10-CM | POA: Diagnosis not present

## 2023-11-26 DIAGNOSIS — F509 Eating disorder, unspecified: Secondary | ICD-10-CM | POA: Diagnosis not present

## 2023-11-26 DIAGNOSIS — F411 Generalized anxiety disorder: Secondary | ICD-10-CM | POA: Diagnosis not present

## 2023-11-26 DIAGNOSIS — F330 Major depressive disorder, recurrent, mild: Secondary | ICD-10-CM | POA: Diagnosis not present

## 2023-12-03 DIAGNOSIS — F411 Generalized anxiety disorder: Secondary | ICD-10-CM | POA: Diagnosis not present

## 2023-12-03 DIAGNOSIS — F330 Major depressive disorder, recurrent, mild: Secondary | ICD-10-CM | POA: Diagnosis not present

## 2023-12-03 DIAGNOSIS — F509 Eating disorder, unspecified: Secondary | ICD-10-CM | POA: Diagnosis not present

## 2023-12-05 DIAGNOSIS — M9904 Segmental and somatic dysfunction of sacral region: Secondary | ICD-10-CM | POA: Diagnosis not present

## 2023-12-05 DIAGNOSIS — M9902 Segmental and somatic dysfunction of thoracic region: Secondary | ICD-10-CM | POA: Diagnosis not present

## 2023-12-05 DIAGNOSIS — M9905 Segmental and somatic dysfunction of pelvic region: Secondary | ICD-10-CM | POA: Diagnosis not present

## 2023-12-05 DIAGNOSIS — M9903 Segmental and somatic dysfunction of lumbar region: Secondary | ICD-10-CM | POA: Diagnosis not present

## 2023-12-10 DIAGNOSIS — F509 Eating disorder, unspecified: Secondary | ICD-10-CM | POA: Diagnosis not present

## 2023-12-10 DIAGNOSIS — F330 Major depressive disorder, recurrent, mild: Secondary | ICD-10-CM | POA: Diagnosis not present

## 2023-12-10 DIAGNOSIS — F411 Generalized anxiety disorder: Secondary | ICD-10-CM | POA: Diagnosis not present

## 2023-12-12 DIAGNOSIS — F330 Major depressive disorder, recurrent, mild: Secondary | ICD-10-CM | POA: Diagnosis not present

## 2023-12-12 DIAGNOSIS — F411 Generalized anxiety disorder: Secondary | ICD-10-CM | POA: Diagnosis not present

## 2023-12-12 DIAGNOSIS — F509 Eating disorder, unspecified: Secondary | ICD-10-CM | POA: Diagnosis not present

## 2023-12-24 DIAGNOSIS — F411 Generalized anxiety disorder: Secondary | ICD-10-CM | POA: Diagnosis not present

## 2023-12-24 DIAGNOSIS — F330 Major depressive disorder, recurrent, mild: Secondary | ICD-10-CM | POA: Diagnosis not present

## 2023-12-24 DIAGNOSIS — F509 Eating disorder, unspecified: Secondary | ICD-10-CM | POA: Diagnosis not present

## 2023-12-31 DIAGNOSIS — F411 Generalized anxiety disorder: Secondary | ICD-10-CM | POA: Diagnosis not present

## 2023-12-31 DIAGNOSIS — F330 Major depressive disorder, recurrent, mild: Secondary | ICD-10-CM | POA: Diagnosis not present

## 2023-12-31 DIAGNOSIS — F509 Eating disorder, unspecified: Secondary | ICD-10-CM | POA: Diagnosis not present

## 2024-01-02 DIAGNOSIS — F330 Major depressive disorder, recurrent, mild: Secondary | ICD-10-CM | POA: Diagnosis not present

## 2024-01-02 DIAGNOSIS — F509 Eating disorder, unspecified: Secondary | ICD-10-CM | POA: Diagnosis not present

## 2024-01-02 DIAGNOSIS — F411 Generalized anxiety disorder: Secondary | ICD-10-CM | POA: Diagnosis not present

## 2024-01-03 DIAGNOSIS — M9903 Segmental and somatic dysfunction of lumbar region: Secondary | ICD-10-CM | POA: Diagnosis not present

## 2024-01-03 DIAGNOSIS — M9904 Segmental and somatic dysfunction of sacral region: Secondary | ICD-10-CM | POA: Diagnosis not present

## 2024-01-03 DIAGNOSIS — M9905 Segmental and somatic dysfunction of pelvic region: Secondary | ICD-10-CM | POA: Diagnosis not present

## 2024-01-03 DIAGNOSIS — M9902 Segmental and somatic dysfunction of thoracic region: Secondary | ICD-10-CM | POA: Diagnosis not present

## 2024-01-07 DIAGNOSIS — F330 Major depressive disorder, recurrent, mild: Secondary | ICD-10-CM | POA: Diagnosis not present

## 2024-01-07 DIAGNOSIS — F411 Generalized anxiety disorder: Secondary | ICD-10-CM | POA: Diagnosis not present

## 2024-01-07 DIAGNOSIS — F509 Eating disorder, unspecified: Secondary | ICD-10-CM | POA: Diagnosis not present

## 2024-01-14 DIAGNOSIS — F509 Eating disorder, unspecified: Secondary | ICD-10-CM | POA: Diagnosis not present

## 2024-01-14 DIAGNOSIS — F411 Generalized anxiety disorder: Secondary | ICD-10-CM | POA: Diagnosis not present

## 2024-01-14 DIAGNOSIS — F330 Major depressive disorder, recurrent, mild: Secondary | ICD-10-CM | POA: Diagnosis not present

## 2024-01-21 DIAGNOSIS — F509 Eating disorder, unspecified: Secondary | ICD-10-CM | POA: Diagnosis not present

## 2024-01-21 DIAGNOSIS — F330 Major depressive disorder, recurrent, mild: Secondary | ICD-10-CM | POA: Diagnosis not present

## 2024-01-21 DIAGNOSIS — F411 Generalized anxiety disorder: Secondary | ICD-10-CM | POA: Diagnosis not present

## 2024-02-04 DIAGNOSIS — F509 Eating disorder, unspecified: Secondary | ICD-10-CM | POA: Diagnosis not present

## 2024-02-04 DIAGNOSIS — F411 Generalized anxiety disorder: Secondary | ICD-10-CM | POA: Diagnosis not present

## 2024-02-04 DIAGNOSIS — F330 Major depressive disorder, recurrent, mild: Secondary | ICD-10-CM | POA: Diagnosis not present

## 2024-02-06 DIAGNOSIS — F411 Generalized anxiety disorder: Secondary | ICD-10-CM | POA: Diagnosis not present

## 2024-02-06 DIAGNOSIS — F509 Eating disorder, unspecified: Secondary | ICD-10-CM | POA: Diagnosis not present

## 2024-02-06 DIAGNOSIS — F330 Major depressive disorder, recurrent, mild: Secondary | ICD-10-CM | POA: Diagnosis not present

## 2024-02-11 DIAGNOSIS — F411 Generalized anxiety disorder: Secondary | ICD-10-CM | POA: Diagnosis not present

## 2024-02-11 DIAGNOSIS — F509 Eating disorder, unspecified: Secondary | ICD-10-CM | POA: Diagnosis not present

## 2024-02-11 DIAGNOSIS — F330 Major depressive disorder, recurrent, mild: Secondary | ICD-10-CM | POA: Diagnosis not present

## 2024-02-18 DIAGNOSIS — F330 Major depressive disorder, recurrent, mild: Secondary | ICD-10-CM | POA: Diagnosis not present

## 2024-02-18 DIAGNOSIS — F509 Eating disorder, unspecified: Secondary | ICD-10-CM | POA: Diagnosis not present

## 2024-02-25 DIAGNOSIS — F411 Generalized anxiety disorder: Secondary | ICD-10-CM | POA: Diagnosis not present

## 2024-02-25 DIAGNOSIS — F330 Major depressive disorder, recurrent, mild: Secondary | ICD-10-CM | POA: Diagnosis not present

## 2024-02-25 DIAGNOSIS — F509 Eating disorder, unspecified: Secondary | ICD-10-CM | POA: Diagnosis not present

## 2024-03-03 DIAGNOSIS — F330 Major depressive disorder, recurrent, mild: Secondary | ICD-10-CM | POA: Diagnosis not present

## 2024-03-03 DIAGNOSIS — F509 Eating disorder, unspecified: Secondary | ICD-10-CM | POA: Diagnosis not present

## 2024-03-03 DIAGNOSIS — F411 Generalized anxiety disorder: Secondary | ICD-10-CM | POA: Diagnosis not present

## 2024-03-05 DIAGNOSIS — F330 Major depressive disorder, recurrent, mild: Secondary | ICD-10-CM | POA: Diagnosis not present

## 2024-03-05 DIAGNOSIS — F411 Generalized anxiety disorder: Secondary | ICD-10-CM | POA: Diagnosis not present

## 2024-03-05 DIAGNOSIS — F509 Eating disorder, unspecified: Secondary | ICD-10-CM | POA: Diagnosis not present

## 2024-03-10 DIAGNOSIS — F5089 Other specified eating disorder: Secondary | ICD-10-CM | POA: Diagnosis not present

## 2024-03-10 DIAGNOSIS — F330 Major depressive disorder, recurrent, mild: Secondary | ICD-10-CM | POA: Diagnosis not present

## 2024-03-17 DIAGNOSIS — F330 Major depressive disorder, recurrent, mild: Secondary | ICD-10-CM | POA: Diagnosis not present

## 2024-03-17 DIAGNOSIS — F5089 Other specified eating disorder: Secondary | ICD-10-CM | POA: Diagnosis not present

## 2024-03-19 DIAGNOSIS — F330 Major depressive disorder, recurrent, mild: Secondary | ICD-10-CM | POA: Diagnosis not present

## 2024-03-19 DIAGNOSIS — F5089 Other specified eating disorder: Secondary | ICD-10-CM | POA: Diagnosis not present

## 2024-03-19 DIAGNOSIS — F411 Generalized anxiety disorder: Secondary | ICD-10-CM | POA: Diagnosis not present

## 2024-03-24 DIAGNOSIS — F5089 Other specified eating disorder: Secondary | ICD-10-CM | POA: Diagnosis not present

## 2024-03-24 DIAGNOSIS — F330 Major depressive disorder, recurrent, mild: Secondary | ICD-10-CM | POA: Diagnosis not present

## 2024-03-30 ENCOUNTER — Encounter (HOSPITAL_BASED_OUTPATIENT_CLINIC_OR_DEPARTMENT_OTHER): Payer: Self-pay | Admitting: Emergency Medicine

## 2024-03-30 ENCOUNTER — Emergency Department (HOSPITAL_BASED_OUTPATIENT_CLINIC_OR_DEPARTMENT_OTHER)
Admission: EM | Admit: 2024-03-30 | Discharge: 2024-03-30 | Disposition: A | Discharge status: Home / Self Care | Attending: Emergency Medicine | Admitting: Emergency Medicine

## 2024-03-30 ENCOUNTER — Ambulatory Visit (HOSPITAL_COMMUNITY)

## 2024-03-30 ENCOUNTER — Other Ambulatory Visit: Payer: Self-pay

## 2024-03-30 DIAGNOSIS — L02414 Cutaneous abscess of left upper limb: Secondary | ICD-10-CM | POA: Insufficient documentation

## 2024-03-30 DIAGNOSIS — L02212 Cutaneous abscess of back [any part, except buttock]: Secondary | ICD-10-CM | POA: Diagnosis not present

## 2024-03-30 DIAGNOSIS — L0291 Cutaneous abscess, unspecified: Secondary | ICD-10-CM

## 2024-03-30 DIAGNOSIS — L03312 Cellulitis of back [any part except buttock]: Secondary | ICD-10-CM | POA: Diagnosis not present

## 2024-03-30 DIAGNOSIS — L0390 Cellulitis, unspecified: Secondary | ICD-10-CM

## 2024-03-30 MED ORDER — LIDOCAINE-EPINEPHRINE 1 %-1:100000 IJ SOLN
10.0000 mL | Freq: Once | INTRAMUSCULAR | Status: DC
  Filled 2024-03-30: qty 1

## 2024-03-30 MED ORDER — DOXYCYCLINE HYCLATE 100 MG PO CAPS: 100.0000 mg | ORAL_CAPSULE | Freq: Two times a day (BID) | ORAL | 0 refills | Status: DC

## 2024-03-30 MED ORDER — DOXYCYCLINE HYCLATE 100 MG PO CAPS: 100.0000 mg | ORAL_CAPSULE | Freq: Two times a day (BID) | ORAL | 0 refills | Status: AC

## 2024-03-30 NOTE — ED Provider Notes (Signed)
 [place] EMERGENCY DEPARTMENT AT [facility] Provider Note   CSN: [id number] Arrival date & time: 03/30/24  [id number]     Patient presents with: Abscess   Yvette White is a 53 y.o. female.   53 year old female presents with left cyst to left shoulder for about a month but worse over the last several days.  Denies any fever or chills but is close to see dermatologist and 2 days.  Noticed some brown drainage from the area as well as increased erythema around it.  Denies any history of trauma       Prior to Admission medications   Medication Sig Start Date End Date Taking? Authorizing Provider  acetaminophen  (TYLENOL ) 500 MG tablet Take 1,000 mg by mouth every 6 (six) hours as needed (for pain.).    [provider]  Amphet-Dextroamphet 3-Bead ER (MYDAYIS) 50 MG CP24 Take 50 mg by mouth daily as needed (ADHD).    [provider]  azelastine  (ASTELIN ) 0.1 % nasal spray Place 2 sprays into both nostrils 2 (two) times daily. Use in each nostril as directed 10/26/23   Soldatova, Liuba, MD  buPROPion  (WELLBUTRIN  XL) 150 MG 24 hr tablet Take 450 mg by mouth daily. 04/20/20   [provider]  busPIRone (BUSPAR) 15 MG tablet Take 15 mg by mouth 2 (two) times daily.    [provider]  Cholecalciferol (VITAMIN D ) 125 MCG (5000 UT) CAPS Take 5,000 Units by mouth daily.    [provider]  clonazePAM  (KLONOPIN ) 0.5 MG tablet TAKE 1 TO 2 TABLETS BY MOUTH EVERY DAY AS NEEDED    [provider]  estradiol  (ESTRACE ) 0.1 MG/GM vaginal cream INSERT 0.5 GRAM VAGINALLY 2 TIMES A WEEK AT BEDTIME 02/08/23   [provider]  estradiol  (ESTRACE ) 2 MG tablet Take 1 tablet by mouth daily. 02/08/23   [provider]  fluticasone  (FLONASE ) 50 MCG/ACT nasal spray Place 2 sprays into both nostrils 2 (two) times daily. 10/26/23   Soldatova, Liuba, MD  folic acid (FOLVITE) 1 MG tablet Take 2 mg by mouth daily.    [provider]   ipratropium (ATROVENT ) 0.03 % nasal spray Place 2 sprays into both nostrils every 12 (twelve) hours. 10/26/23   Soldatova, Liuba, MD  lamoTRIgine (LAMICTAL) 200 MG tablet Take 200 mg by mouth every evening.     [provider]  levocetirizine (XYZAL  ALLERGY  24HR) 5 MG tablet Take 1 tablet (5 mg total) by mouth every evening. 10/26/23   Soldatova, Liuba, MD  methotrexate (RHEUMATREX) 2.5 MG tablet Take 12.5 mg by mouth See admin instructions. Caution:Chemotherapy. Protect from light. On Mondays - take 5 tabs twice daily    [provider]  Multiple Vitamins-Minerals (CELEBRATE MULTI-COMPLETE 18 PO) Take 1 tablet by mouth daily.    [provider]  Olopatadine-Mometasone (RYALTRIS ) 665-25 MCG/ACT SUSP Place 2 sprays into the nose 2 (two) times daily as needed. 08/16/23   Iva Marty Saltness, MD  pantoprazole  (PROTONIX ) 40 MG tablet TAKE 1 TABLET(40 MG) BY MOUTH DAILY 12/17/20   Jodie Lavern CROME, MD  polyvinyl alcohol (LIQUIFILM TEARS) 1.4 % ophthalmic solution Place 1 drop into both eyes as needed for dry eyes.    [provider]    Allergies: Sulfamethoxazole-trimethoprim, Theophylline, Penicillins, and Pseudoephedrine    Review of Systems  All other systems reviewed and are negative.   Updated Vital Signs BP 129/62 (BP Location: Right Arm)   Pulse (!) 53   Temp 98.3 F (36.8 C) (Oral)  Resp 20   Ht 1.676 m (5' 6)   Wt (!) 140.6 kg   SpO2 98%   BMI 50.04 kg/m   Physical Exam Vitals and nursing note reviewed.  Constitutional:      General: She is not in acute distress.    Appearance: Normal appearance. She is well-developed. She is not toxic-appearing.  HENT:     Head: Normocephalic and atraumatic.  Eyes:     General: Lids are normal.     Conjunctiva/sclera: Conjunctivae normal.     Pupils: Pupils are equal, round, and reactive to light.  Neck:     Thyroid : No thyroid  mass.     Trachea: No tracheal deviation.  Cardiovascular:     Rate and  Rhythm: Normal rate and regular rhythm.     Heart sounds: Normal heart sounds. No murmur heard.    No gallop.  Pulmonary:     Effort: Pulmonary effort is normal. No respiratory distress.     Breath sounds: Normal breath sounds. No stridor. No decreased breath sounds, wheezing, rhonchi or rales.  Abdominal:     General: There is no distension.     Palpations: Abdomen is soft.     Tenderness: There is no abdominal tenderness. There is no rebound.  Musculoskeletal:        General: No tenderness. Normal range of motion.     Cervical back: Normal range of motion and neck supple.     Comments: Carbuncle noted to left posterior shoulder with surrounding cellulitis  Skin:    General: Skin is warm and dry.     Findings: No abrasion or rash.  Neurological:     Mental Status: She is alert and oriented to person, place, and time. Mental status is at baseline.     GCS: GCS eye subscore is 4. GCS verbal subscore is 5. GCS motor subscore is 6.     Cranial Nerves: No cranial nerve deficit.     Sensory: No sensory deficit.     Motor: Motor function is intact.  Psychiatric:        Attention and Perception: Attention normal.        Speech: Speech normal.        Behavior: Behavior normal.     (all labs ordered are listed, but only abnormal results are displayed) Labs Reviewed - No data to display  EKG: None  Radiology: No results found.   .Incision and Drainage  Date/Time: 03/30/2024 8:01 AM  Performed by: Dasie Faden, MD Authorized by: Dasie Faden, MD   Consent:    Consent obtained:  Verbal   Consent given by:  Patient   Risks, benefits, and alternatives were discussed: yes     Alternatives discussed:  No treatment Universal protocol:    Immediately prior to procedure, a time out was called: yes     Patient identity confirmed:  Verbally with patient Location:    Type:  Abscess   Size:  2 cm   Location:  Trunk   Trunk location:  Back Pre-procedure details:    Skin  preparation:  Antiseptic wash Sedation:    Sedation type:  None Anesthesia:    Anesthesia method:  Local infiltration   Local anesthetic:  Lidocaine  1% WITH epi Procedure type:    Complexity:  Complex Procedure details:    Ultrasound guidance: no     Needle aspiration: no     Incision types:  Single straight   Wound management:  Probed and deloculated   Drainage:  Purulent   Drainage amount:  Moderate   Wound treatment:  Wound left open   Packing materials:  None Post-procedure details:    Procedure completion:  Tolerated    Medications Ordered in the ED  lidocaine -EPINEPHrine  (XYLOCAINE  W/EPI) 1 %-1:100000 (with pres) injection 10 mL (has no administration in time range)                                    Medical Decision Making Risk Prescription drug management.   Patient is abscess drained as above.  Alysis evidence of surrounding cellulitis.  Will place on doxycycline  and she will see the dermatologist in 2 days.     Final diagnoses:  None    ED Discharge Orders     None          Dasie Faden, MD 03/30/24 [phone]

## 2024-03-30 NOTE — ED Triage Notes (Signed)
 Patient reports abscess to left shoulder ongoing for a month that became more painful yesterday after opening and draining. Reports brownish drainage at site. Dermatology appt on 11/4 but pain was too severe. Denies fevers at home.

## 2024-03-31 DIAGNOSIS — F5089 Other specified eating disorder: Secondary | ICD-10-CM | POA: Diagnosis not present

## 2024-03-31 DIAGNOSIS — F411 Generalized anxiety disorder: Secondary | ICD-10-CM | POA: Diagnosis not present

## 2024-03-31 DIAGNOSIS — F330 Major depressive disorder, recurrent, mild: Secondary | ICD-10-CM | POA: Diagnosis not present

## 2024-04-01 DIAGNOSIS — L720 Epidermal cyst: Secondary | ICD-10-CM | POA: Diagnosis not present

## 2024-04-01 DIAGNOSIS — L02212 Cutaneous abscess of back [any part, except buttock]: Secondary | ICD-10-CM | POA: Diagnosis not present

## 2024-04-02 DIAGNOSIS — F5089 Other specified eating disorder: Secondary | ICD-10-CM | POA: Diagnosis not present

## 2024-04-02 DIAGNOSIS — F330 Major depressive disorder, recurrent, mild: Secondary | ICD-10-CM | POA: Diagnosis not present

## 2024-04-02 DIAGNOSIS — F411 Generalized anxiety disorder: Secondary | ICD-10-CM | POA: Diagnosis not present

## 2024-04-07 DIAGNOSIS — F330 Major depressive disorder, recurrent, mild: Secondary | ICD-10-CM | POA: Diagnosis not present

## 2024-04-07 DIAGNOSIS — F411 Generalized anxiety disorder: Secondary | ICD-10-CM | POA: Diagnosis not present

## 2024-04-07 DIAGNOSIS — F5089 Other specified eating disorder: Secondary | ICD-10-CM | POA: Diagnosis not present

## 2024-04-14 DIAGNOSIS — F330 Major depressive disorder, recurrent, mild: Secondary | ICD-10-CM | POA: Diagnosis not present

## 2024-04-14 DIAGNOSIS — F5089 Other specified eating disorder: Secondary | ICD-10-CM | POA: Diagnosis not present

## 2024-04-28 DIAGNOSIS — F411 Generalized anxiety disorder: Secondary | ICD-10-CM | POA: Diagnosis not present

## 2024-04-28 DIAGNOSIS — F5089 Other specified eating disorder: Secondary | ICD-10-CM | POA: Diagnosis not present

## 2024-04-28 DIAGNOSIS — F330 Major depressive disorder, recurrent, mild: Secondary | ICD-10-CM | POA: Diagnosis not present

## 2024-04-30 DIAGNOSIS — F411 Generalized anxiety disorder: Secondary | ICD-10-CM | POA: Diagnosis not present

## 2024-04-30 DIAGNOSIS — F330 Major depressive disorder, recurrent, mild: Secondary | ICD-10-CM | POA: Diagnosis not present

## 2024-04-30 DIAGNOSIS — F5089 Other specified eating disorder: Secondary | ICD-10-CM | POA: Diagnosis not present

## 2024-05-05 DIAGNOSIS — L02212 Cutaneous abscess of back [any part, except buttock]: Secondary | ICD-10-CM | POA: Diagnosis not present

## 2024-05-05 DIAGNOSIS — F330 Major depressive disorder, recurrent, mild: Secondary | ICD-10-CM | POA: Diagnosis not present

## 2024-05-05 DIAGNOSIS — F5089 Other specified eating disorder: Secondary | ICD-10-CM | POA: Diagnosis not present

## 2024-05-05 DIAGNOSIS — L720 Epidermal cyst: Secondary | ICD-10-CM | POA: Diagnosis not present

## 2024-05-05 DIAGNOSIS — F411 Generalized anxiety disorder: Secondary | ICD-10-CM | POA: Diagnosis not present

## 2024-05-08 DIAGNOSIS — F411 Generalized anxiety disorder: Secondary | ICD-10-CM | POA: Diagnosis not present

## 2024-05-08 DIAGNOSIS — F3189 Other bipolar disorder: Secondary | ICD-10-CM | POA: Diagnosis not present

## 2024-05-08 DIAGNOSIS — F900 Attention-deficit hyperactivity disorder, predominantly inattentive type: Secondary | ICD-10-CM | POA: Diagnosis not present

## 2024-05-12 DIAGNOSIS — F5089 Other specified eating disorder: Secondary | ICD-10-CM | POA: Diagnosis not present

## 2024-05-12 DIAGNOSIS — F330 Major depressive disorder, recurrent, mild: Secondary | ICD-10-CM | POA: Diagnosis not present

## 2024-05-12 DIAGNOSIS — F411 Generalized anxiety disorder: Secondary | ICD-10-CM | POA: Diagnosis not present
